# Patient Record
Sex: Female | Born: 1945 | Race: White | Hispanic: Yes | Smoking: Former smoker
Health system: Southern US, Community
[De-identification: ages and names within clinical notes are randomized; demographics above are authoritative.]

## PROBLEM LIST (undated history)

## (undated) DIAGNOSIS — C3412 Malignant neoplasm of upper lobe, left bronchus or lung: Secondary | ICD-10-CM

## (undated) DIAGNOSIS — N179 Acute kidney failure, unspecified: Secondary | ICD-10-CM

## (undated) DIAGNOSIS — G9341 Metabolic encephalopathy: Secondary | ICD-10-CM

## (undated) DIAGNOSIS — E46 Unspecified protein-calorie malnutrition: Secondary | ICD-10-CM

## (undated) DIAGNOSIS — B958 Unspecified staphylococcus as the cause of diseases classified elsewhere: Secondary | ICD-10-CM

## (undated) DIAGNOSIS — M71051 Abscess of bursa, right hip: Secondary | ICD-10-CM

## (undated) DIAGNOSIS — G44229 Chronic tension-type headache, not intractable: Secondary | ICD-10-CM

## (undated) DIAGNOSIS — I4891 Unspecified atrial fibrillation: Secondary | ICD-10-CM

## (undated) DIAGNOSIS — F10939 Alcohol use, unspecified with withdrawal, unspecified: Secondary | ICD-10-CM

## (undated) DIAGNOSIS — J189 Pneumonia, unspecified organism: Secondary | ICD-10-CM

## (undated) DIAGNOSIS — D509 Iron deficiency anemia, unspecified: Secondary | ICD-10-CM

## (undated) DIAGNOSIS — R1312 Dysphagia, oropharyngeal phase: Secondary | ICD-10-CM

## (undated) DIAGNOSIS — F329 Major depressive disorder, single episode, unspecified: Secondary | ICD-10-CM

## (undated) DIAGNOSIS — F10239 Alcohol dependence with withdrawal, unspecified: Secondary | ICD-10-CM

## (undated) DIAGNOSIS — J9690 Respiratory failure, unspecified, unspecified whether with hypoxia or hypercapnia: Secondary | ICD-10-CM

## (undated) DIAGNOSIS — I447 Left bundle-branch block, unspecified: Secondary | ICD-10-CM

## (undated) DIAGNOSIS — M6281 Muscle weakness (generalized): Secondary | ICD-10-CM

## (undated) DIAGNOSIS — K651 Peritoneal abscess: Secondary | ICD-10-CM

## (undated) DIAGNOSIS — L03119 Cellulitis of unspecified part of limb: Secondary | ICD-10-CM

## (undated) DIAGNOSIS — F32A Depression, unspecified: Secondary | ICD-10-CM

## (undated) DIAGNOSIS — I10 Essential (primary) hypertension: Secondary | ICD-10-CM

## (undated) DIAGNOSIS — M069 Rheumatoid arthritis, unspecified: Secondary | ICD-10-CM

## (undated) DIAGNOSIS — E870 Hyperosmolality and hypernatremia: Secondary | ICD-10-CM

## (undated) DIAGNOSIS — J45909 Unspecified asthma, uncomplicated: Secondary | ICD-10-CM

## (undated) DIAGNOSIS — R571 Hypovolemic shock: Secondary | ICD-10-CM

## (undated) HISTORY — DX: Metabolic encephalopathy: G93.41

## (undated) HISTORY — PX: TONSILLECTOMY: SUR1361

## (undated) HISTORY — DX: Dysphagia, oropharyngeal phase: R13.12

## (undated) HISTORY — DX: Acute kidney failure, unspecified: N17.9

## (undated) HISTORY — DX: Hypovolemic shock: R57.1

## (undated) HISTORY — DX: Chronic tension-type headache, not intractable: G44.229

## (undated) HISTORY — DX: Malignant neoplasm of upper lobe, left bronchus or lung: C34.12

## (undated) HISTORY — DX: Peritoneal abscess: K65.1

## (undated) HISTORY — DX: Alcohol use, unspecified with withdrawal, unspecified: F10.939

## (undated) HISTORY — DX: Left bundle-branch block, unspecified: I44.7

## (undated) HISTORY — DX: Cellulitis of unspecified part of limb: L03.119

## (undated) HISTORY — DX: Unspecified protein-calorie malnutrition: E46

## (undated) HISTORY — PX: BREAST ENHANCEMENT SURGERY: SHX7

## (undated) HISTORY — PX: LUMBAR FUSION: SHX111

## (undated) HISTORY — DX: Respiratory failure, unspecified, unspecified whether with hypoxia or hypercapnia: J96.90

## (undated) HISTORY — DX: Hyperosmolality and hypernatremia: E87.0

## (undated) HISTORY — PX: EYE SURGERY: SHX253

## (undated) HISTORY — DX: Pneumonia, unspecified organism: J18.9

## (undated) HISTORY — DX: Essential (primary) hypertension: I10

## (undated) HISTORY — DX: Unspecified asthma, uncomplicated: J45.909

## (undated) HISTORY — DX: Rheumatoid arthritis, unspecified: M06.9

## (undated) HISTORY — DX: Iron deficiency anemia, unspecified: D50.9

## (undated) HISTORY — PX: COLONOSCOPY: SHX174

## (undated) HISTORY — DX: Unspecified staphylococcus as the cause of diseases classified elsewhere: B95.8

## (undated) HISTORY — DX: Abscess of bursa, right hip: M71.051

## (undated) HISTORY — DX: Depression, unspecified: F32.A

## (undated) HISTORY — DX: Alcohol dependence with withdrawal, unspecified: F10.239

## (undated) HISTORY — PX: ESOPHAGOGASTRODUODENOSCOPY: SHX1529

## (undated) HISTORY — PX: CERVICAL FUSION: SHX112

## (undated) HISTORY — PX: ABDOMINAL HYSTERECTOMY: SUR658

## (undated) HISTORY — DX: Muscle weakness (generalized): M62.81

---
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
## 2019-03-28 DIAGNOSIS — R7881 Bacteremia: Secondary | ICD-10-CM

## 2019-03-28 DIAGNOSIS — K922 Gastrointestinal hemorrhage, unspecified: Secondary | ICD-10-CM

## 2019-03-28 DIAGNOSIS — B9561 Methicillin susceptible Staphylococcus aureus infection as the cause of diseases classified elsewhere: Secondary | ICD-10-CM

## 2019-03-28 HISTORY — DX: Methicillin susceptible Staphylococcus aureus infection as the cause of diseases classified elsewhere: B95.61

## 2019-03-28 HISTORY — DX: Bacteremia: R78.81

## 2019-03-28 HISTORY — DX: Gastrointestinal hemorrhage, unspecified: K92.2

## 2019-03-28 HISTORY — PX: ABCESS DRAINAGE: SHX399

## 2019-03-28 HISTORY — PX: INCISION AND DRAINAGE OF WOUND: SHX1803

## 2019-05-27 ENCOUNTER — Other Ambulatory Visit: Payer: Self-pay

## 2019-05-27 ENCOUNTER — Ambulatory Visit (INDEPENDENT_AMBULATORY_CARE_PROVIDER_SITE_OTHER): Payer: Medicare PPO | Admitting: Internal Medicine

## 2019-05-27 DIAGNOSIS — Z8719 Personal history of other diseases of the digestive system: Secondary | ICD-10-CM | POA: Diagnosis not present

## 2019-05-27 DIAGNOSIS — R7881 Bacteremia: Secondary | ICD-10-CM | POA: Diagnosis not present

## 2019-05-27 DIAGNOSIS — M069 Rheumatoid arthritis, unspecified: Secondary | ICD-10-CM | POA: Insufficient documentation

## 2019-05-27 DIAGNOSIS — L03119 Cellulitis of unspecified part of limb: Secondary | ICD-10-CM

## 2019-05-27 DIAGNOSIS — R911 Solitary pulmonary nodule: Secondary | ICD-10-CM | POA: Diagnosis not present

## 2019-05-27 DIAGNOSIS — L02419 Cutaneous abscess of limb, unspecified: Secondary | ICD-10-CM

## 2019-05-27 DIAGNOSIS — B9561 Methicillin susceptible Staphylococcus aureus infection as the cause of diseases classified elsewhere: Secondary | ICD-10-CM

## 2019-05-27 DIAGNOSIS — D649 Anemia, unspecified: Secondary | ICD-10-CM

## 2019-05-27 DIAGNOSIS — J984 Other disorders of lung: Secondary | ICD-10-CM | POA: Insufficient documentation

## 2019-05-27 NOTE — Progress Notes (Signed)
[facility]  Reason for Consult: MSSA bacteremia Referring Provider: Dr. Virgel Bouquet  Assessment: Samantha Keller recently developed a severe, metastatic MSSA infection.  Dr. Darrel Reach said that it was unclear if this began from soft tissue infection in her legs or pneumonia.  Samantha Keller seems to be improving but needs to continue IV cefazolin through 06/25/2019.  She will need a follow-up chest CT scan with contrast.  I have reviewed this recommendation with her daughters and they are in agreement.  I am told that she has a pulmonary evaluation scheduled in the near future with Dr. Noemi Chapel.  Plan: 1. Continue IV cefazolin through 06/25/2019 2. Arrange follow-up chest CT scan with contrast around 06/14/2019  Patient Active Problem List   Diagnosis Date Noted  . Bacteremia due to methicillin susceptible Staphylococcus aureus (MSSA) 05/27/2019    Patient's Medications   No medications on file    HPI: Samantha Keller is a 74 y.o. female from [place], [place] with a history of MSSA bacteremia.  She was referred from [facility] but I do not have any records from the facility pertaining to any specifics about when or where she was diagnosed and what work-up she had.  Fortunately, she is accompanied by her 2 daughters today who are able to give great detail.  She has a history of rheumatoid arthritis.  She developed some increasing left leg pain in December and her doctor increased her dose of Humira suspecting a flare of her RA.  Samantha Keller received her first Covid vaccine in mid January.  Shortly after that her daughters became concerned that she was confused when they spoke to her over the phone.  Thet finally called police on 7/56/4332.  She was found to be confused, febrile and hypoxic and was admitted to the hospital in [place].  She had hyponatremia and acute kidney injury.  Chest x-ray and CT scan revealed a right upper lobe mass concerning for  cancer versus infection.  They tell me that blood cultures grew MSSA.  She was on a variety of antibiotics during the first week of her illness.  An infectious disease doctor was consulted.  Samantha Keller condition grew worse when she developed a GI bleed and required intubation.  She was treated for a right IJ central line infection.  Scans of her brain and spine were negative for infection.  Scans of her lower extremities revealed left thigh cellulitis and bilateral soft tissue abscesses around her hip.  She underwent incision and drainage of the abscesses.  The abscess on her right side tracked down to her greater trochanter.  There was concern for drug fever to ampicillin.  She was switched to IV cefazolin and had a PICC placed in her left arm.  She has now completed 35 days of total IV antibiotic therapy.    She tested negative for C. difficile colitis.  Echocardiography, including TEE, did not show any evidence of endocarditis.  I spoke to Dr. Royetta Crochet [phone]), an internist who cared for her.. She told me that Samantha Keller underwent a repeat chest CT scan which showed enlargement and cavitation of the right upper lobe infiltrate.  Dr. Darrel Reach said that the final recommendation was to continue IV cefazolin for 6 weeks after discharge on 05/14/2019 given the concerns about cavitary pneumonia and the fact that her right trochanteric bursa abscess had just been drained.  Dr. Darrel Reach said that upon discharge her hemoglobin  was 8.8 and her creatinine had normalized to 0.8.  Samantha Keller was transferred to [facility] skilled nursing facility on 05/14/2019.  I do not have any lab work from [facility]  She does not have any recall of her hospitalization.  She says that she is unsure if she is getting better.  She is participating and physical therapy and is able to stand and do some walking.  Her appetite remains poor and she is very weak.  Her short-term memory has suffered.  Review of  Systems: Review of Systems  Unable to perform ROS: Other  Constitutional:       All history is provided by her daughters.      No past medical history on file.  Social History   Tobacco Use  . Smoking status: Not on file  Substance Use Topics  . Alcohol use: Not on file  . Drug use: Not on file    No family history on file. Not on File  OBJECTIVE: Vitals:   05/27/19 0851  BP: (!) 151/86  Pulse: (!) 108   There is no height or weight on file to calculate BMI.   Physical Exam Constitutional:      Comments: She is weak and pale but otherwise in no distress.  Cardiovascular:     Rate and Rhythm: Normal rate and regular rhythm.     Heart sounds: No murmur.  Pulmonary:     Effort: Pulmonary effort is normal.     Breath sounds: Normal breath sounds.  Abdominal:     Palpations: Abdomen is soft.     Tenderness: There is no abdominal tenderness.  Musculoskeletal:     Right lower leg: Edema present.     Left lower leg: Edema present.  Skin:    Findings: No rash.     Comments: Left upper arm PICC.  Some nodularity in her right neck where her IJ line had been.  Neurological:     General: No focal deficit present.  Psychiatric:        Mood and Affect: Mood normal.    CMP     Component Value Date/Time   NA 136 05/27/2019 1010   K 3.6 05/27/2019 1010   CL 100 05/27/2019 1010   CO2 30 05/27/2019 1010   GLUCOSE 95 05/27/2019 1010   BUN 12 05/27/2019 1010   CREATININE 0.62 05/27/2019 1010   CALCIUM 8.2 (L) 05/27/2019 1010   Lab Results  Component Value Date   WBC 11.0 (H) 05/27/2019   HGB 8.8 (L) 05/27/2019   HCT 27.3 (L) 05/27/2019   MCV 95.5 05/27/2019   PLT 276 05/27/2019   Sed Rate (mm/h)  Date Value  05/27/2019 62 (H)   CRP (mg/L)  Date Value  05/27/2019 59.2 (H)    Microbiology: No results found for this or any previous visit (from the past 240 hour(s)).  Michel Bickers, MD [facility] [phone] pager   [phone] cell 05/27/2019, 9:34 AM

## 2019-05-28 ENCOUNTER — Other Ambulatory Visit: Payer: Self-pay | Admitting: Internal Medicine

## 2019-05-28 ENCOUNTER — Telehealth: Payer: Self-pay | Admitting: *Deleted

## 2019-05-28 ENCOUNTER — Encounter: Payer: Self-pay | Admitting: Internal Medicine

## 2019-05-28 DIAGNOSIS — J984 Other disorders of lung: Secondary | ICD-10-CM

## 2019-05-28 DIAGNOSIS — R911 Solitary pulmonary nodule: Secondary | ICD-10-CM

## 2019-05-28 LAB — BASIC METABOLIC PANEL
BUN: 12 mg/dL (ref 7–25)
CO2: 30 mmol/L (ref 20–32)
Calcium: 8.2 mg/dL — ABNORMAL LOW (ref 8.6–10.4)
Chloride: 100 mmol/L (ref 98–110)
Creat: 0.62 mg/dL (ref 0.60–0.93)
Glucose, Bld: 95 mg/dL (ref 65–99)
Potassium: 3.6 mmol/L (ref 3.5–5.3)
Sodium: 136 mmol/L (ref 135–146)

## 2019-05-28 LAB — SEDIMENTATION RATE: Sed Rate: 62 mm/h — ABNORMAL HIGH (ref 0–30)

## 2019-05-28 LAB — C-REACTIVE PROTEIN: CRP: 59.2 mg/L — ABNORMAL HIGH (ref <8.0)

## 2019-05-28 LAB — CBC
HCT: 27.3 % — ABNORMAL LOW (ref 35.0–45.0)
Hemoglobin: 8.8 g/dL — ABNORMAL LOW (ref 11.7–15.5)
MCH: 30.8 pg (ref 27.0–33.0)
MCHC: 32.2 g/dL (ref 32.0–36.0)
MCV: 95.5 fL (ref 80.0–100.0)
MPV: 9.7 fL (ref 7.5–12.5)
Platelets: 276 10*3/uL (ref 140–400)
RBC: 2.86 10*6/uL — ABNORMAL LOW (ref 3.80–5.10)
RDW: 15.5 % — ABNORMAL HIGH (ref 11.0–15.0)
WBC: 11 10*3/uL — ABNORMAL HIGH (ref 3.8–10.8)

## 2019-05-28 NOTE — Telephone Encounter (Signed)
RN faxed office note, written lab orders (weekly CBC, BMP, ESR, CRP) and order for Chest CT to Norvel Richards, nurse taking care of Hadiyah today at [facility]. Joycelyn Schmid is working on prior authorization for Chest CT, per Dr Megan Salon should be scheduled around 3/20. Per Norvel Richards, ok for this to be scheduled at the hospital.  Joycelyn Schmid will need to coordinate transportation with Shauna Hugh or Pamala Hurry at [facility].  If Joycelyn Schmid needs any additional insurance information, she can reach out to [employer] or [place].  All are available at the main number [phone]. Landis Gandy, RN

## 2019-05-29 ENCOUNTER — Telehealth: Payer: Self-pay | Admitting: Gastroenterology

## 2019-05-29 NOTE — Telephone Encounter (Signed)
DOD 05/29/19   Dr. Bryan Lemma, there is a referral from [facility] to evaluate pt for GI bleed.  Records will be sent to you for review.  Please advise scheduling.

## 2019-05-30 NOTE — Telephone Encounter (Signed)
Spoke with pt's nurse--she stated that pt came to [facility] from [facility], so notes are limited but she will fax over what she has.    A message was left with facility's transportation line to call back to schedule OV for pt.

## 2019-05-30 NOTE — Telephone Encounter (Signed)
Referral paperwork reviewed and notable following:   -Referral notes are quite limited, essentially just list of ICD 10 codes and MAR with daily vitals over the last week or so.  No written notes, plans, etc. No labs for review -Recently completed course of cefazolin 2/19-3/3. -Started taking ferrous sulfate 325 mg daily) on 5/74/7340 -Started folic acid 2 mg/daily on 2/17 -Additional medications include MiraLAX, Protonix 40 mg bid along baclofen, Cymbalta, estradiol patch, gabapentin, melatonin, Restasis drops -Afebrile heart rate range 81-107, normal RR, mildly elevated SBP  While I am happy to see this patient for evaluation, would certainly need more records to be sent from [facility] and rehab to assist better, to include recent notes/documentation, labs, etc.  Please assist in obtaining these records for my review before her appointment.  Otherwise, okay to schedule for OV

## 2019-06-02 ENCOUNTER — Encounter: Payer: Self-pay | Admitting: Internal Medicine

## 2019-06-05 ENCOUNTER — Encounter: Payer: Self-pay | Admitting: Critical Care Medicine

## 2019-06-05 ENCOUNTER — Other Ambulatory Visit: Payer: Self-pay

## 2019-06-05 ENCOUNTER — Ambulatory Visit (INDEPENDENT_AMBULATORY_CARE_PROVIDER_SITE_OTHER): Payer: Medicare PPO | Admitting: Critical Care Medicine

## 2019-06-05 VITALS — BP 110/85 | HR 99 | Temp 97.6°F

## 2019-06-05 DIAGNOSIS — J984 Other disorders of lung: Secondary | ICD-10-CM

## 2019-06-05 DIAGNOSIS — J9611 Chronic respiratory failure with hypoxia: Secondary | ICD-10-CM | POA: Diagnosis not present

## 2019-06-05 DIAGNOSIS — R5381 Other malaise: Secondary | ICD-10-CM

## 2019-06-05 DIAGNOSIS — R7881 Bacteremia: Secondary | ICD-10-CM

## 2019-06-05 DIAGNOSIS — Z87891 Personal history of nicotine dependence: Secondary | ICD-10-CM

## 2019-06-05 DIAGNOSIS — B9561 Methicillin susceptible Staphylococcus aureus infection as the cause of diseases classified elsewhere: Secondary | ICD-10-CM

## 2019-06-05 DIAGNOSIS — R601 Generalized edema: Secondary | ICD-10-CM

## 2019-06-05 NOTE — Progress Notes (Signed)
Synopsis: Referred in March 2021 for a lung lesion by Michel Bickers, MD.  Subjective:   PATIENT ID: Samantha Keller GENDER: female DOB: 1945-05-07, MRN: [id number]  Chief Complaint  Patient presents with  . Consult    Patient was admitted to hospital(Jan-Feb) in [facility] for double pneumonia and also found a spot on her lung. Patient is on 2 liters. Patient has picc line on left side.    Mrs. Birkeland is a 74 year old woman with history of rheumatoid arthritis previously on anti-TNF therapy who presents for follow-up of a right upper lobe cavitary lung lesion.  2 daughters present during the visit who provided most of the history.  This lung mass was identified during hospitalization for MSSA bacteremia resulting from a skin and soft tissue infection.  Throughout the hospitalization this lesion changed and began to cavitate per reports of her daughters.  She has no previous history of malignancy other than skin cancers.  She had an aunt with breast cancer, but no other family history of malignancy.  She smoked 2 packs/day for 20 years before quitting in 1984. She has a history of childhood asthma but no symptoms throughout adulthood.  During her recent hospitalization in [place], [place] she had bilateral pneumonia, AKI, ETOH w/d, upper GI bleed with hemorrhagic shock, CLABSI, and required intubation mechanical ventilation for about 6 days.  She underwent I&D of 5 hip abscesses where the infection began.  She is currently on IV cefazolin therapy via left upper extremity PICC.  Her RA medications have all been held since her illness.  She has established care with Dr. Megan Salon from infectious disease (05/27/2019 clinic note reviewed).  She will remain on cefazolin through 06/25/2019.  She has a follow-up chest CT ordered the last week of March.  She was discharged in the hospital on 05/14/2019 to [facility] for rehab.  She has been spending most of the day in bed when she is not participating in therapy.  She  continues to have significant weakness.  She was discharged from the hospital without oxygen, but was started on 2 L of supplemental oxygen during her at rehab.  She had shortness of breath prior to being started on oxygen, but feels better on oxygen.  She has a chronic cough with minimal sputum.  She was evaluated by an ENT yesterday who told her doctors that she had pooled phlegm and she should continue coughing.  She denies wheezing.  She had no shortness of breath or pulmonary symptoms prior to hospitalization.  Left upper extremity swelling-daughters report that and ultrasound was done earlier this week which confirmed no DVT around her PICC.  Records from outside hospital reviewed-brought to the visit by her daughter. Complicated admission. Multiple procedures performed- intubated, b/l IJ & right femoral CVCs, Aline, thora (transudative fluid), I&D, EGD with injection of arterial bleeding, PICC, TE (normal valves, no vegetations, but had clot on end of CVC)  TTE (OSH, 04/23/19): LVEG 50%, trace MR, miltly thickened aortic valve leaflets, trace TR. TEE (OSH, 04/30/19): LVEF 55-60%, vegetation on tip of CVC in SVC, but normal valves without vegetation.  Pleural fluid glucose 112, LDH 89, protein <3g. No records of cytology being performed.  CT chest 05/06/19: 2.2cm RUL cavitating nodule, moderate b/l pleural effusions, anasarca CT chest 04/27/19: 3.1x 1.8cm masslike consolidation RUL, b/l pleural effusions      Past Medical History:  Diagnosis Date  . MSSA bacteremia 2021  . RA (rheumatoid arthritis) ([place])   . UGIB (upper gastrointestinal bleed)  2021     Family History  Problem Relation Age of Onset  . Asthma Mother   . Asthma Father   . COPD Father   . Asthma Sister      Past Surgical History:  Procedure Laterality Date  . ABCESS DRAINAGE Bilateral 2021  . INCISION AND DRAINAGE OF WOUND Bilateral 2021   hip, thigh    Social History   Socioeconomic History  . Marital status:  Divorced    Spouse name: Not on file  . Number of children: Not on file  . Years of education: Not on file  . Highest education level: Not on file  Occupational History  . Not on file  Tobacco Use  . Smoking status: Former Smoker    Packs/day: 2.00    Years: 20.00    Pack years: 40.00    Types: Cigarettes    Quit date: 06/05/1982    Years since quitting: 37.0  . Smokeless tobacco: Never Used  Substance and Sexual Activity  . Alcohol use: Not Currently  . Drug use: Never  . Sexual activity: Not Currently  Other Topics Concern  . Not on file  Social History Narrative  . Not on file   Social Determinants of Health   Financial Resource Strain:   . Difficulty of Paying Living Expenses:   Food Insecurity:   . Worried About Charity fundraiser in the Last Year:   . Arboriculturist in the Last Year:   Transportation Needs:   . Film/video editor (Medical):   Marland Kitchen Lack of Transportation (Non-Medical):   Physical Activity:   . Days of Exercise per Week:   . Minutes of Exercise per Session:   Stress:   . Feeling of Stress :   Social Connections:   . Frequency of Communication with Friends and Family:   . Frequency of Social Gatherings with Friends and Family:   . Attends Religious Services:   . Active Member of Clubs or Organizations:   . Attends Archivist Meetings:   Marland Kitchen Marital Status:   Intimate Partner Violence:   . Fear of Current or Ex-Partner:   . Emotionally Abused:   Marland Kitchen Physically Abused:   . Sexually Abused:      Allergies  Allergen Reactions  . Iodinated Diagnostic Agents Anaphylaxis  . Shellfish Allergy Anaphylaxis  . Shellfish-Derived Products Anaphylaxis      There is no immunization history on file for this patient.  Outpatient Medications Prior to Visit  Medication Sig Dispense Refill  . Ascorbic Acid (VITAMIN C) 500 MG CAPS Take 500 mg by mouth daily.    . baclofen (LIORESAL) 10 MG tablet Take 10 mg by mouth 3 (three) times daily.    Marland Kitchen  ceFAZolin (ANCEF) 1 g injection Inject 2 g into the vein every 8 (eight) hours.    . cycloSPORINE (RESTASIS) 0.05 % ophthalmic emulsion 1 drop 2 (two) times daily.    Marland Kitchen doxycycline (VIBRA-TABS) 100 MG tablet Take 100 mg by mouth 2 (two) times daily.    . DULoxetine (CYMBALTA) 20 MG capsule Take 20 mg by mouth daily.    Marland Kitchen estradiol (CLIMARA - DOSED IN MG/24 HR) 0.0375 mg/24hr patch Place 0.0375 mg onto the skin once a week.    . ferrous sulfate 325 (65 FE) MG tablet Take 325 mg by mouth daily with breakfast.    . folic acid (FOLVITE) 0.5 MG tablet Take 2 tablets by mouth daily.    Marland Kitchen gabapentin (NEURONTIN)  600 MG tablet Take 600 mg by mouth 3 (three) times daily.    . Melatonin 5 MG TABS Take by mouth.    . pantoprazole (PROTONIX) 40 MG tablet Take 40 mg by mouth daily.    . polyethylene glycol (MIRALAX / GLYCOLAX) 17 g packet Take 17 g by mouth daily.    . potassium chloride (KLOR-CON) 20 MEQ packet Take 20 mEq by mouth 2 (two) times daily.    Marland Kitchen tuberculin (TUBERSOL) 5 UNIT/0.1ML injection Inject into the skin once.    Marland Kitchen ceFAZolin (ANCEF) IVPB Inject into the vein.    . folic acid (FOLVITE) 1 MG tablet Take 1 mg by mouth daily.     No facility-administered medications prior to visit.    Review of Systems  Constitutional: Positive for malaise/fatigue. Negative for chills and fever.  Respiratory: Positive for cough and shortness of breath. Negative for wheezing.   Cardiovascular: Positive for leg swelling.  Musculoskeletal: Negative for joint pain.  Neurological: Positive for weakness.     Objective:   Vitals:   06/05/19 1014 06/05/19 1015  BP:  110/85  Pulse: 99   Temp:  97.6 F (36.4 C)  TempSrc:  Temporal  SpO2: 99%    99% on 2 LPM   BMI Readings from Last 3 Encounters:  No data found for BMI   Wt Readings from Last 3 Encounters:  No data found for Wt    Physical Exam Vitals reviewed.  Constitutional:      General: She is not in acute distress.    Comments: Frail  and chronically ill-appearing  HENT:     Head: Normocephalic and atraumatic.  Eyes:     General: No scleral icterus. Cardiovascular:     Rate and Rhythm: Normal rate and regular rhythm.     Heart sounds: No murmur.  Pulmonary:     Comments: Breathing comfortably on 2 L nasal cannula, reduced bibasilar breath sounds, otherwise clear to auscultation.  No tachypnea. Abdominal:     General: There is no distension.     Palpations: Abdomen is soft.  Musculoskeletal:     Cervical back: Neck supple.     Comments: Significant left upper extremity swelling into her hand.  Anasarca of upper extremities to about 3 inches above her elbow, anasarca of bilateral lower extremities to her waist.  Lymphadenopathy:     Cervical: No cervical adenopathy.  Skin:    General: Skin is warm and dry.     Coloration: Skin is pale.     Findings: No rash.  Neurological:     Mental Status: She is alert.     Coordination: Coordination normal.     Comments: Sitting in a wheelchair, moving upper extremities, but globally weak.  Psychiatric:        Mood and Affect: Mood normal.        Behavior: Behavior normal.      CBC    Component Value Date/Time   WBC 11.0 (H) 05/27/2019 1010   RBC 2.86 (L) 05/27/2019 1010   HGB 8.8 (L) 05/27/2019 1010   HCT 27.3 (L) 05/27/2019 1010   PLT 276 05/27/2019 1010   MCV 95.5 05/27/2019 1010   MCH 30.8 05/27/2019 1010   MCHC 32.2 05/27/2019 1010   RDW 15.5 (H) 05/27/2019 1010    CHEMISTRY No results for input(s): NA, K, CL, CO2, GLUCOSE, BUN, CREATININE, CALCIUM, MG, PHOS in the last 168 hours. CrCl cannot be calculated (Unknown ideal weight.).   Chest Imaging- films reviewed:  Imaging reports only available- reviewed as above.  Pulmonary Functions Testing Results: No flowsheet data found.   TTE (OSH, 04/23/19): LVEG 50%, trace MR, miltly thickened aortic valve leaflets, trace TR. TTEE 04/30/19: LVEF 55-60%, vegetation on tip of CVC in SVC, but normal valves without  vegetation.      Assessment & Plan:     ICD-10-CM   1. Chronic respiratory failure with hypoxia (HCC)  J96.11   2. Anasarca  R60.1   3. MSSA bacteremia  R78.81    B95.61   4. Cavitary lesion of lung  J98.4   5. History of tobacco abuse  Z87.891   6. Debility  R53.81     Cavitary lung lesion- by reports may have shrunken during her admission in the process of cavitating. This raises suspicion for infectious etiology related to MSSA bacteremia or possibly aspiration pneumonia, although her history of tobacco abuse and immunosuppression are concerning. Her history of skin cancer has not been clarified as melanoma vs non-melanoma, which affects her potential risk for metastatic lesion. -follow up chest CT scan in late March -if mass persists without a significant decrease in size or enlarges needs biopsy for path & micro -requested daughters obtain disks of previous chest CT scans for comparison  Chronic respiratory failure with hypoxia-based on physical exam expect is due to third spacing of fluid and atelectasis. -Encourage mobility, some things as simple as leaning forward and taking deep breaths, use of incentive spirometer. -She currently does not appear stable enough to undergo a significant invasive procedure or biopsy. -Agree with ongoing rehabilitation efforts     I spent >80 minutes on this encounter, including face to face time and non-face to face time spent reviewing records, charting, coordinating care, etc. Plan discussed with the patient and her 2 daughters.   Current Outpatient Medications:  .  Ascorbic Acid (VITAMIN C) 500 MG CAPS, Take 500 mg by mouth daily., Disp: , Rfl:  .  baclofen (LIORESAL) 10 MG tablet, Take 10 mg by mouth 3 (three) times daily., Disp: , Rfl:  .  ceFAZolin (ANCEF) 1 g injection, Inject 2 g into the vein every 8 (eight) hours., Disp: , Rfl:  .  cycloSPORINE (RESTASIS) 0.05 % ophthalmic emulsion, 1 drop 2 (two) times daily., Disp: , Rfl:  .   doxycycline (VIBRA-TABS) 100 MG tablet, Take 100 mg by mouth 2 (two) times daily., Disp: , Rfl:  .  DULoxetine (CYMBALTA) 20 MG capsule, Take 20 mg by mouth daily., Disp: , Rfl:  .  estradiol (CLIMARA - DOSED IN MG/24 HR) 0.0375 mg/24hr patch, Place 0.0375 mg onto the skin once a week., Disp: , Rfl:  .  ferrous sulfate 325 (65 FE) MG tablet, Take 325 mg by mouth daily with breakfast., Disp: , Rfl:  .  folic acid (FOLVITE) 0.5 MG tablet, Take 2 tablets by mouth daily., Disp: , Rfl:  .  gabapentin (NEURONTIN) 600 MG tablet, Take 600 mg by mouth 3 (three) times daily., Disp: , Rfl:  .  Melatonin 5 MG TABS, Take by mouth., Disp: , Rfl:  .  pantoprazole (PROTONIX) 40 MG tablet, Take 40 mg by mouth daily., Disp: , Rfl:  .  polyethylene glycol (MIRALAX / GLYCOLAX) 17 g packet, Take 17 g by mouth daily., Disp: , Rfl:  .  potassium chloride (KLOR-CON) 20 MEQ packet, Take 20 mEq by mouth 2 (two) times daily., Disp: , Rfl:  .  tuberculin (TUBERSOL) 5 UNIT/0.1ML injection, Inject into the skin once., Disp: , Rfl:  Julian Hy, DO [facility] 06/05/2019 12:12 PM

## 2019-06-05 NOTE — Patient Instructions (Addendum)
Thank you for visiting Dr. Carlis Abbott at [facility]. We recommend the following:  CT scan scheduled for 3/25 at 2pm at [facility]  Please obtain disc of CT scans of the chest from prior hospitalization to bring to Radiology Department at [facility] for [place] to compare images.   Return in about 3 weeks (around 06/26/2019).    Please do your part to reduce the spread of COVID-19.

## 2019-06-09 ENCOUNTER — Ambulatory Visit (HOSPITAL_COMMUNITY): Payer: Medicare PPO

## 2019-06-11 ENCOUNTER — Other Ambulatory Visit: Payer: Self-pay

## 2019-06-11 ENCOUNTER — Ambulatory Visit: Payer: Medicare PPO | Admitting: Gastroenterology

## 2019-06-11 ENCOUNTER — Encounter: Payer: Self-pay | Admitting: Gastroenterology

## 2019-06-11 ENCOUNTER — Ambulatory Visit (INDEPENDENT_AMBULATORY_CARE_PROVIDER_SITE_OTHER): Payer: Medicare PPO | Admitting: Gastroenterology

## 2019-06-11 VITALS — BP 130/84 | HR 106 | Temp 97.1°F | Ht 62.0 in | Wt 165.2 lb

## 2019-06-11 DIAGNOSIS — K922 Gastrointestinal hemorrhage, unspecified: Secondary | ICD-10-CM

## 2019-06-11 DIAGNOSIS — K279 Peptic ulcer, site unspecified, unspecified as acute or chronic, without hemorrhage or perforation: Secondary | ICD-10-CM | POA: Diagnosis not present

## 2019-06-11 DIAGNOSIS — Z01818 Encounter for other preprocedural examination: Secondary | ICD-10-CM

## 2019-06-11 DIAGNOSIS — K5900 Constipation, unspecified: Secondary | ICD-10-CM | POA: Diagnosis not present

## 2019-06-11 DIAGNOSIS — K2970 Gastritis, unspecified, without bleeding: Secondary | ICD-10-CM

## 2019-06-11 NOTE — Patient Instructions (Signed)
Due to recent COVID-19 restrictions implemented by our local and state authorities and in an effort to keep both patients and staff as safe as possible, our hospital system now requires COVID-19 testing prior to any scheduled hospital procedure. Please go to our [facility] location drive thru testing site ([address], [place],  [address]) on 07/02/19 at  930am. There will be multiple testing areas, the first checkpoint being for pre-procedure/surgery testing. Get into the right (yellow) lane that leads to the PAT testing team. You will not be billed at the time of testing but may receive a bill later depending on your insurance. The approximate cost of the test is $100. You must agree to quarantine from the time of your testing until the procedure date on 07/04/19 . This should include staying at home with ONLY the people you live with. Avoid take-out, grocery store shopping or leaving the house for any non-emergent reason. Failure to have your COVID-19 test done on the date and time you have been scheduled will result in cancellation of procedure. Please call our office at [phone] if you have any questions.   You have been scheduled to have an anorectal manometry at [facility] on 07/04/19 at 1030am. Please arrive 30 minutes prior to your appointment time for registration (1st floor of the hospital-admissions).  Please make certain to use 1 Fleets enema 2 hours prior to coming for your appointment. You can purchase Fleets enemas from the laxative section at your drug store. You should not eat anything during the two hours prior to the procedure. You may take regular medications with small sips of water at least 2 hours prior to the study.  Anorectal manometry is a test performed to evaluate patients with constipation or fecal incontinence. This test measures the pressures of the anal sphincter muscles, the sensation in the rectum, and the neural reflexes that are needed for normal  bowel movements.  THE PROCEDURE The test takes approximately 30 minutes to 1 hour. You will be asked to change into a hospital gown. A technician or nurse will explain the procedure to you, take a brief health history, and answer any questions you may have. The patient then lies on his or her left side. A small, flexible tube, about the size of a thermometer, with a balloon at the end is inserted into the rectum. The catheter is connected to a machine that measures the pressure. During the test, the small balloon attached to the catheter may be inflated in the rectum to assess the normal reflex pathways. The nurse or technician may also ask the person to squeeze, relax, and push at various times. The anal sphincter muscle pressures are measured during each of these maneuvers. To squeeze, the patient tightens the sphincter muscles as if trying to prevent anything from coming out. To push or bear down, the patient strains down as if trying to have a bowel movement.  You have been scheduled for an endoscopy at [facility]. Please follow written instructions given to you at your visit today. If you use inhalers (even only as needed), please bring them with you on the day of your procedure. Your physician has requested that you go to www.startemmi.com and enter the access code given to you at your visit today. This web site gives a general overview about your procedure. However, you should still follow specific instructions given to you by our office regarding your preparation for the procedure.  It was a pleasure  to see you today!  Vito Cirigliano, D.O.

## 2019-06-11 NOTE — Progress Notes (Signed)
Chief Complaint: Recent upper GI bleed  Referring Provider:     Virgel Bouquet   HPI:    Samantha Keller is a 74 y.o. female referred to the Gastroenterology Clinic for evaluation after recent prolonged hospital course complicated by upper GI bleed as outlined below..  Recent hospitalization at [facility] in [place], [place], and was seen by Dr. Carlis Abbott in the Pulmonary clinic on 06/05/2019 with the following noted about recent hospital course: Hospitalized with bilateral pneumonia, AKI, ETOH w/d, upper GI bleed with hemorrhagic shock, CLABSI, and required intubation mechanical ventilation for about 6 days.  She underwent I&D of 5 hip abscesses where the infection began.  She is currently on IV cefazolin therapy via left upper extremity PICC.  Her RA medications have all been held since her illness.  She has established care with Dr. Megan Salon from infectious disease.  She will remain on cefazolin through 06/25/2019.  She has a follow-up chest CT ordered the last week of March.  She was discharged in the hospital on 05/14/2019 to [facility] for rehab.  She has been spending most of the day in bed when she is not participating in therapy.  She continues to have significant weakness.  She was discharged from the hospital without oxygen, but was started on 2 L of supplemental oxygen during her at rehab.    Complicated admission. Multiple procedures performed- intubated, b/l IJ & right femoral CVCs, Aline, thora (transudative fluid), I&D, EGD with treatment of arterial bleeding, PICC, TE (normal valves, no vegetations, but had clot on end of CVC)  TTE ([facility], 04/23/19): LVEG 50%, trace MR, miltly thickened aortic valve leaflets, trace TR. TEE ([facility], 04/30/19): LVEF 55-60%, vegetation on tip of CVC in SVC, but normal valves without vegetation.  CT chest 05/06/19: 2.2cm RUL cavitating nodule, moderate b/l pleural effusions, anasarca CT chest 04/27/19: 3.1x 1.8cm masslike consolidation  RUL, b/l pleural effusions -Plan for repeat CT scan late March  -Labs on 05/27/2019: WBC 11, H/H 8.8/27.3, PLT 2/76.  ESR/CRP 62/59, normal BMP.  No comparison labs for review ENT evaluation 06/04/2019 for airway concern and MBS with abnormal findings on the trachea and cough.  Laryngoscopy with scattered mucus/crusting in the oro and hypopharynx and prominent crust in the subglottic airway, likely overlying inflammation related to recent endotracheal tube.  Daughters present today and assist in providing history regarding recent hospitalization.  She is otherwise a good historian.  They relate she had a brisk upper GI bleed requiring EGD with treatment of a "bleeding artery" with thermal therapy and clip placement.  No recurrence of bleeding.  Has not had any bleeding in the rehab facility.  No prior history of GI bleed.  Currently taking Protonix 40 mg/day.  She is otherwise without any abdominal pain, nausea, vomiting.  Good p.o. intake, except c/o of food being "disgusting" at the facility.  Currently GI complaint today is of constipation.  This is new since hospital discharge.  No prior history of constipation.  Is currently taking MiraLAX.  Increased straining to have BM and feels like "my pushing against a brick wall".   Past Medical History:  Diagnosis Date  . LBBB (left bundle branch block)   . MSSA bacteremia 2021  . RA (rheumatoid arthritis) ([place])   . UGIB (upper gastrointestinal bleed) 2021     Past Surgical History:  Procedure Laterality Date  . ABCESS DRAINAGE Bilateral 2021  . ABDOMINAL HYSTERECTOMY    .  BREAST ENHANCEMENT SURGERY    . CERVICAL FUSION    . COLONOSCOPY     about 5 years. Been doing cologuard  . ESOPHAGOGASTRODUODENOSCOPY     [place]   . EYE SURGERY Bilateral    lens replacement  . INCISION AND DRAINAGE OF WOUND Bilateral 2021   hip, thigh  . LUMBAR FUSION    . TONSILLECTOMY     Family History  Problem Relation Age of Onset  .  Asthma Mother   . Asthma Father   . COPD Father   . CAD Father   . Asthma Sister   . Breast cancer Paternal Aunt   . High Cholesterol Brother   . Colon cancer Neg Hx    Social History   Tobacco Use  . Smoking status: Former Smoker    Packs/day: 2.00    Years: 20.00    Pack years: 40.00    Types: Cigarettes    Quit date: 06/05/1982    Years since quitting: 37.0  . Smokeless tobacco: Never Used  Substance Use Topics  . Alcohol use: Not Currently  . Drug use: Never   Current Outpatient Medications  Medication Sig Dispense Refill  . Ascorbic Acid (VITAMIN C) 500 MG CAPS Take 500 mg by mouth daily.    . baclofen (LIORESAL) 10 MG tablet Take 10 mg by mouth 3 (three) times daily.    Marland Kitchen ceFAZolin (ANCEF) 1 g injection Inject 2 g into the vein every 8 (eight) hours.    . cycloSPORINE (RESTASIS) 0.05 % ophthalmic emulsion 1 drop 2 (two) times daily.    Marland Kitchen doxycycline (VIBRA-TABS) 100 MG tablet Take 100 mg by mouth 2 (two) times daily.    . DULoxetine (CYMBALTA) 20 MG capsule Take 20 mg by mouth daily.    Marland Kitchen estradiol (CLIMARA - DOSED IN MG/24 HR) 0.0375 mg/24hr patch Place 0.0375 mg onto the skin once a week.    . ferrous sulfate 325 (65 FE) MG tablet Take 325 mg by mouth daily with breakfast.    . folic acid (FOLVITE) 0.5 MG tablet Take 2 tablets by mouth daily.    Marland Kitchen gabapentin (NEURONTIN) 600 MG tablet Take 600 mg by mouth 3 (three) times daily.    . Melatonin 5 MG TABS Take by mouth.    . pantoprazole (PROTONIX) 40 MG tablet Take 40 mg by mouth daily.    . polyethylene glycol (MIRALAX / GLYCOLAX) 17 g packet Take 17 g by mouth daily.    . potassium chloride (KLOR-CON) 20 MEQ packet Take 20 mEq by mouth 2 (two) times daily.    Marland Kitchen tuberculin (TUBERSOL) 5 UNIT/0.1ML injection Inject into the skin once.     No current facility-administered medications for this visit.   Allergies  Allergen Reactions  . Iodinated Diagnostic Agents Anaphylaxis  . Shellfish Allergy Anaphylaxis  .  Shellfish-Derived Products Anaphylaxis     Review of Systems: All systems reviewed and negative except where noted in HPI.     Physical Exam:    Wt Readings from Last 3 Encounters:  06/11/19 165 lb 3 oz (74.9 kg)    BP 130/84   Pulse (!) 106   Temp (!) 97.1 F (36.2 C)   Ht 5' 2"  (1.575 m)   Wt 165 lb 3 oz (74.9 kg)   BMI 30.21 kg/m  Constitutional:  Pleasant, in no acute distress.  Sitting in wheelchair.  Well conversive Psychiatric: Normal mood and affect. Behavior is normal. Cardiovascular: Normal rate, regular  rhythm Pulmonary/chest: Effort normal and breath sounds normal. No wheezing, rales or rhonchi. Abdominal: Soft, nondistended, nontender. Bowel sounds active throughout. Neurological: Alert and oriented to person place and time.   ASSESSMENT AND PLAN;   1) Recent upper GI bleed 2) Presumed acute blood loss anemia By patient and family description, sounds most like ulcer with visible vessel requiring endoscopic therapy.  Additional etiologies include Dieulafoy lesion, AVMs, etc.  Plan for the following:  -EGD at [facility] in April to assess for appropriate healing (prescribed supplemental O2).  Gastric biopsies to rule out H. pylori as appropriate based on endoscopic appearance -Resume Protonix -Resume serial H/H checks in rehab facility as currently doing.  Limited labs reviewed today demonstrate stability  3) Constipation Clinical description seems most consistent with pelvic floor dyssynergia.  No prior history of constipation.  Plan for the following: -Resume MiraLAX as currently doing -Ensure adequate hydration and high-fiber diet -Anorectal Manometry.  If consistent with pelvic floor dyssynergia, plan for referral to PT for pelvic floor retraining/biofeedback -If ARM negative, either change to bowel regimen vs Sitz marker study vs colonoscopy  4) CRC screening: -Reports negative Cologuard earlier this year  The indications, risks, and benefits of EGD were  explained to the patient in detail. Risks include but are not limited to bleeding, perforation, adverse reaction to medications, and cardiopulmonary compromise. Sequelae include but are not limited to the possibility of surgery, hositalization, and mortality.  Discussed elevated periprocedural risks related to underlying recent acute medical issues.  The patient verbalized understanding and wished to proceed. All questions answered, referred to scheduler. Further recommendations pending results of the exam.   I spent 65 minutes of time, including in depth chart review, review of outside records, independent review of results as outlined above, communicating results with the patient directly, face-to-face time with the patient and her family, coordinating care, ordering studies and medications as appropriate, and documentation.       [place], DO, FACG  06/11/2019, 11:20 AM   No ref. provider found

## 2019-06-18 ENCOUNTER — Telehealth: Payer: Self-pay | Admitting: *Deleted

## 2019-06-19 ENCOUNTER — Other Ambulatory Visit: Payer: Self-pay

## 2019-06-19 ENCOUNTER — Ambulatory Visit
Admission: RE | Admit: 2019-06-19 | Discharge: 2019-06-19 | Disposition: A | Discharge status: Home / Self Care | Payer: Self-pay | Source: Ambulatory Visit | Attending: Internal Medicine | Admitting: Internal Medicine

## 2019-06-19 ENCOUNTER — Other Ambulatory Visit (HOSPITAL_COMMUNITY): Payer: Self-pay | Admitting: Internal Medicine

## 2019-06-19 ENCOUNTER — Encounter (HOSPITAL_COMMUNITY): Payer: Self-pay | Admitting: Family Medicine

## 2019-06-19 ENCOUNTER — Inpatient Hospital Stay
Admission: RE | Admit: 2019-06-19 | Discharge: 2019-06-19 | Disposition: A | Discharge status: Home / Self Care | Payer: Self-pay | Source: Ambulatory Visit | Attending: Internal Medicine | Admitting: Internal Medicine

## 2019-06-19 ENCOUNTER — Ambulatory Visit (HOSPITAL_COMMUNITY)
Admission: RE | Admit: 2019-06-19 | Discharge: 2019-06-19 | Disposition: A | Discharge status: Home / Self Care | Payer: Medicare PPO | Source: Ambulatory Visit | Attending: Internal Medicine | Admitting: Internal Medicine

## 2019-06-19 ENCOUNTER — Encounter (HOSPITAL_COMMUNITY): Payer: Self-pay

## 2019-06-19 ENCOUNTER — Inpatient Hospital Stay (HOSPITAL_COMMUNITY)
Admission: EM | Admit: 2019-06-19 | Discharge: 2019-07-01 | DRG: 286 | Disposition: A | Discharge status: Skilled Nursing Facility | Payer: Medicare PPO | Source: Skilled Nursing Facility | Attending: Internal Medicine | Admitting: Internal Medicine

## 2019-06-19 ENCOUNTER — Telehealth: Payer: Self-pay | Admitting: Infectious Disease

## 2019-06-19 DIAGNOSIS — C801 Malignant (primary) neoplasm, unspecified: Secondary | ICD-10-CM

## 2019-06-19 DIAGNOSIS — R946 Abnormal results of thyroid function studies: Secondary | ICD-10-CM | POA: Diagnosis present

## 2019-06-19 DIAGNOSIS — E8770 Fluid overload, unspecified: Secondary | ICD-10-CM | POA: Diagnosis present

## 2019-06-19 DIAGNOSIS — Z79899 Other long term (current) drug therapy: Secondary | ICD-10-CM

## 2019-06-19 DIAGNOSIS — J984 Other disorders of lung: Secondary | ICD-10-CM

## 2019-06-19 DIAGNOSIS — Z825 Family history of asthma and other chronic lower respiratory diseases: Secondary | ICD-10-CM

## 2019-06-19 DIAGNOSIS — Z87891 Personal history of nicotine dependence: Secondary | ICD-10-CM

## 2019-06-19 DIAGNOSIS — E872 Acidosis, unspecified: Secondary | ICD-10-CM

## 2019-06-19 DIAGNOSIS — Z6824 Body mass index (BMI) 24.0-24.9, adult: Secondary | ICD-10-CM

## 2019-06-19 DIAGNOSIS — Z803 Family history of malignant neoplasm of breast: Secondary | ICD-10-CM

## 2019-06-19 DIAGNOSIS — J9819 Other pulmonary collapse: Secondary | ICD-10-CM | POA: Diagnosis present

## 2019-06-19 DIAGNOSIS — R911 Solitary pulmonary nodule: Secondary | ICD-10-CM | POA: Insufficient documentation

## 2019-06-19 DIAGNOSIS — R601 Generalized edema: Secondary | ICD-10-CM | POA: Diagnosis not present

## 2019-06-19 DIAGNOSIS — E440 Moderate protein-calorie malnutrition: Secondary | ICD-10-CM | POA: Insufficient documentation

## 2019-06-19 DIAGNOSIS — J9611 Chronic respiratory failure with hypoxia: Secondary | ICD-10-CM | POA: Diagnosis present

## 2019-06-19 DIAGNOSIS — Z8701 Personal history of pneumonia (recurrent): Secondary | ICD-10-CM

## 2019-06-19 DIAGNOSIS — Z9889 Other specified postprocedural states: Secondary | ICD-10-CM

## 2019-06-19 DIAGNOSIS — Z9981 Dependence on supplemental oxygen: Secondary | ICD-10-CM

## 2019-06-19 DIAGNOSIS — G8929 Other chronic pain: Secondary | ICD-10-CM | POA: Diagnosis present

## 2019-06-19 DIAGNOSIS — I110 Hypertensive heart disease with heart failure: Secondary | ICD-10-CM | POA: Diagnosis not present

## 2019-06-19 DIAGNOSIS — I5021 Acute systolic (congestive) heart failure: Secondary | ICD-10-CM

## 2019-06-19 DIAGNOSIS — N049 Nephrotic syndrome with unspecified morphologic changes: Secondary | ICD-10-CM | POA: Diagnosis present

## 2019-06-19 DIAGNOSIS — D649 Anemia, unspecified: Secondary | ICD-10-CM

## 2019-06-19 DIAGNOSIS — G629 Polyneuropathy, unspecified: Secondary | ICD-10-CM | POA: Diagnosis present

## 2019-06-19 DIAGNOSIS — Z8249 Family history of ischemic heart disease and other diseases of the circulatory system: Secondary | ICD-10-CM

## 2019-06-19 DIAGNOSIS — E876 Hypokalemia: Secondary | ICD-10-CM | POA: Diagnosis not present

## 2019-06-19 DIAGNOSIS — L03116 Cellulitis of left lower limb: Secondary | ICD-10-CM | POA: Diagnosis present

## 2019-06-19 DIAGNOSIS — B9561 Methicillin susceptible Staphylococcus aureus infection as the cause of diseases classified elsewhere: Secondary | ICD-10-CM | POA: Diagnosis present

## 2019-06-19 DIAGNOSIS — J9620 Acute and chronic respiratory failure, unspecified whether with hypoxia or hypercapnia: Secondary | ICD-10-CM | POA: Diagnosis present

## 2019-06-19 DIAGNOSIS — L8990 Pressure ulcer of unspecified site, unspecified stage: Secondary | ICD-10-CM | POA: Insufficient documentation

## 2019-06-19 DIAGNOSIS — F329 Major depressive disorder, single episode, unspecified: Secondary | ICD-10-CM | POA: Diagnosis present

## 2019-06-19 DIAGNOSIS — Z20822 Contact with and (suspected) exposure to covid-19: Secondary | ICD-10-CM | POA: Diagnosis present

## 2019-06-19 DIAGNOSIS — I428 Other cardiomyopathies: Secondary | ICD-10-CM | POA: Diagnosis present

## 2019-06-19 DIAGNOSIS — M069 Rheumatoid arthritis, unspecified: Secondary | ICD-10-CM | POA: Diagnosis present

## 2019-06-19 DIAGNOSIS — J90 Pleural effusion, not elsewhere classified: Secondary | ICD-10-CM | POA: Diagnosis not present

## 2019-06-19 DIAGNOSIS — R7881 Bacteremia: Secondary | ICD-10-CM | POA: Diagnosis present

## 2019-06-19 DIAGNOSIS — R0600 Dyspnea, unspecified: Secondary | ICD-10-CM

## 2019-06-19 DIAGNOSIS — Z8719 Personal history of other diseases of the digestive system: Secondary | ICD-10-CM

## 2019-06-19 DIAGNOSIS — K219 Gastro-esophageal reflux disease without esophagitis: Secondary | ICD-10-CM | POA: Diagnosis present

## 2019-06-19 DIAGNOSIS — E871 Hypo-osmolality and hyponatremia: Secondary | ICD-10-CM | POA: Diagnosis present

## 2019-06-19 DIAGNOSIS — E441 Mild protein-calorie malnutrition: Secondary | ICD-10-CM | POA: Diagnosis present

## 2019-06-19 DIAGNOSIS — L02415 Cutaneous abscess of right lower limb: Secondary | ICD-10-CM | POA: Diagnosis present

## 2019-06-19 DIAGNOSIS — J45909 Unspecified asthma, uncomplicated: Secondary | ICD-10-CM | POA: Diagnosis present

## 2019-06-19 DIAGNOSIS — Z91041 Radiographic dye allergy status: Secondary | ICD-10-CM

## 2019-06-19 DIAGNOSIS — D539 Nutritional anemia, unspecified: Secondary | ICD-10-CM | POA: Diagnosis present

## 2019-06-19 DIAGNOSIS — E874 Mixed disorder of acid-base balance: Secondary | ICD-10-CM | POA: Diagnosis not present

## 2019-06-19 DIAGNOSIS — R0602 Shortness of breath: Secondary | ICD-10-CM

## 2019-06-19 DIAGNOSIS — Z91013 Allergy to seafood: Secondary | ICD-10-CM

## 2019-06-19 DIAGNOSIS — R000 Tachycardia, unspecified: Secondary | ICD-10-CM | POA: Diagnosis present

## 2019-06-19 LAB — CBC WITH DIFFERENTIAL/PLATELET
Abs Immature Granulocytes: 0.04 10*3/uL (ref 0.00–0.07)
Basophils Absolute: 0 10*3/uL (ref 0.0–0.1)
Basophils Relative: 0 %
Eosinophils Absolute: 0 10*3/uL (ref 0.0–0.5)
Eosinophils Relative: 0 %
HCT: 31.5 % — ABNORMAL LOW (ref 36.0–46.0)
Hemoglobin: 9.7 g/dL — ABNORMAL LOW (ref 12.0–15.0)
Immature Granulocytes: 1 %
Lymphocytes Relative: 35 %
Lymphs Abs: 2.2 10*3/uL (ref 0.7–4.0)
MCH: 31.8 pg (ref 26.0–34.0)
MCHC: 30.8 g/dL (ref 30.0–36.0)
MCV: 103.3 fL — ABNORMAL HIGH (ref 80.0–100.0)
Monocytes Absolute: 0.2 10*3/uL (ref 0.1–1.0)
Monocytes Relative: 4 %
Neutro Abs: 3.8 10*3/uL (ref 1.7–7.7)
Neutrophils Relative %: 60 %
Platelets: 360 10*3/uL (ref 150–400)
RBC: 3.05 MIL/uL — ABNORMAL LOW (ref 3.87–5.11)
RDW: 17.1 % — ABNORMAL HIGH (ref 11.5–15.5)
WBC: 6.3 10*3/uL (ref 4.0–10.5)
nRBC: 0 % (ref 0.0–0.2)

## 2019-06-19 LAB — URINALYSIS, ROUTINE W REFLEX MICROSCOPIC
Bilirubin Urine: NEGATIVE
Glucose, UA: 50 mg/dL — AB
Hgb urine dipstick: NEGATIVE
Ketones, ur: 5 mg/dL — AB
Nitrite: NEGATIVE
Protein, ur: 30 mg/dL — AB
Specific Gravity, Urine: >1.046 — ABNORMAL HIGH (ref 1.005–1.030)
WBC, UA: >50 WBC/hpf — ABNORMAL HIGH (ref 0–5)
pH: 6 (ref 5.0–8.0)

## 2019-06-19 LAB — COMPREHENSIVE METABOLIC PANEL
ALT: 6 U/L (ref 0–44)
AST: 25 U/L (ref 15–41)
Albumin: 1.8 g/dL — ABNORMAL LOW (ref 3.5–5.0)
Alkaline Phosphatase: 145 U/L — ABNORMAL HIGH (ref 38–126)
Anion gap: 10 (ref 5–15)
BUN: 10 mg/dL (ref 8–23)
CO2: 27 mmol/L (ref 22–32)
Calcium: 8.1 mg/dL — ABNORMAL LOW (ref 8.9–10.3)
Chloride: 99 mmol/L (ref 98–111)
Creatinine, Ser: 0.68 mg/dL (ref 0.44–1.00)
GFR calc Af Amer: >60 mL/min (ref >60)
GFR calc non Af Amer: >60 mL/min (ref >60)
Glucose, Bld: 163 mg/dL — ABNORMAL HIGH (ref 70–99)
Potassium: 3.8 mmol/L (ref 3.5–5.1)
Sodium: 136 mmol/L (ref 135–145)
Total Bilirubin: 0.8 mg/dL (ref 0.3–1.2)
Total Protein: 6.3 g/dL — ABNORMAL LOW (ref 6.5–8.1)

## 2019-06-19 LAB — PROTIME-INR
INR: 1.1 (ref 0.8–1.2)
Prothrombin Time: 13.6 seconds (ref 11.4–15.2)

## 2019-06-19 LAB — ABO/RH: ABO/RH(D): A POS

## 2019-06-19 LAB — TYPE AND SCREEN
ABO/RH(D): A POS
Antibody Screen: NEGATIVE

## 2019-06-19 LAB — RESPIRATORY PANEL BY RT PCR (FLU A&B, COVID)
Influenza A by PCR: NEGATIVE
Influenza B by PCR: NEGATIVE
SARS Coronavirus 2 by RT PCR: NEGATIVE

## 2019-06-19 LAB — TROPONIN I (HIGH SENSITIVITY): Troponin I (High Sensitivity): 12 ng/L (ref <18)

## 2019-06-19 LAB — BRAIN NATRIURETIC PEPTIDE: B Natriuretic Peptide: 1843.9 pg/mL — ABNORMAL HIGH (ref 0.0–100.0)

## 2019-06-19 MED ORDER — SODIUM CHLORIDE 0.9% FLUSH: 3.0000 mL | INTRAVENOUS | Status: DC | PRN

## 2019-06-19 MED ORDER — POLYETHYLENE GLYCOL 3350 17 G PO PACK
17.0000 g | PACK | Freq: Every day | ORAL | Status: DC | PRN
  Administered 2019-06-21 – 2019-07-01 (×3): 17 g via ORAL
  Filled 2019-06-19 (×2): qty 1

## 2019-06-19 MED ORDER — CYCLOSPORINE 0.05 % OP EMUL
1.0000 [drp] | Freq: Two times a day (BID) | OPHTHALMIC | Status: DC
  Administered 2019-06-20 – 2019-07-01 (×23): 1 [drp] via OPHTHALMIC
  Filled 2019-06-19 (×27): qty 1

## 2019-06-19 MED ORDER — SODIUM CHLORIDE (PF) 0.9 % IJ SOLN
INTRAMUSCULAR | Status: AC
  Filled 2019-06-19: qty 50

## 2019-06-19 MED ORDER — SODIUM CHLORIDE 0.9% FLUSH
3.0000 mL | Freq: Two times a day (BID) | INTRAVENOUS | Status: DC
  Administered 2019-06-20 – 2019-06-30 (×17): 3 mL via INTRAVENOUS

## 2019-06-19 MED ORDER — IOHEXOL 350 MG/ML SOLN: 75.0000 mL | Freq: Once | INTRAVENOUS | Status: DC | PRN

## 2019-06-19 MED ORDER — FOLIC ACID 1 MG PO TABS
2.0000 mg | ORAL_TABLET | Freq: Every day | ORAL | Status: DC
  Administered 2019-06-20 – 2019-07-01 (×12): 2 mg via ORAL
  Filled 2019-06-19 (×12): qty 2

## 2019-06-19 MED ORDER — BACLOFEN 10 MG PO TABS
10.0000 mg | ORAL_TABLET | Freq: Three times a day (TID) | ORAL | Status: DC | PRN
  Filled 2019-06-19: qty 1

## 2019-06-19 MED ORDER — CEFAZOLIN SODIUM-DEXTROSE 2-4 GM/100ML-% IV SOLN
2.0000 g | Freq: Three times a day (TID) | INTRAVENOUS | Status: AC
  Administered 2019-06-20 – 2019-06-25 (×19): 2 g via INTRAVENOUS
  Filled 2019-06-19 (×20): qty 100

## 2019-06-19 MED ORDER — MELATONIN 3 MG PO TABS
6.0000 mg | ORAL_TABLET | Freq: Every day | ORAL | Status: DC
  Administered 2019-06-20 – 2019-06-30 (×12): 6 mg via ORAL
  Filled 2019-06-19 (×14): qty 2

## 2019-06-19 MED ORDER — GABAPENTIN 600 MG PO TABS
600.0000 mg | ORAL_TABLET | Freq: Three times a day (TID) | ORAL | Status: DC
  Administered 2019-06-20 – 2019-07-01 (×36): 600 mg via ORAL
  Filled 2019-06-19 (×38): qty 1

## 2019-06-19 MED ORDER — ONDANSETRON HCL 4 MG/2ML IJ SOLN: 4.0000 mg | Freq: Four times a day (QID) | INTRAMUSCULAR | Status: DC | PRN

## 2019-06-19 MED ORDER — SODIUM CHLORIDE 0.9 % IV SOLN: 250.0000 mL | INTRAVENOUS | Status: DC | PRN

## 2019-06-19 MED ORDER — ACETAMINOPHEN 650 MG RE SUPP: 650.0000 mg | Freq: Four times a day (QID) | RECTAL | Status: DC | PRN

## 2019-06-19 MED ORDER — DOXYCYCLINE HYCLATE 100 MG PO TABS
100.0000 mg | ORAL_TABLET | Freq: Two times a day (BID) | ORAL | Status: DC
  Administered 2019-06-20 – 2019-06-29 (×20): 100 mg via ORAL
  Filled 2019-06-19 (×20): qty 1

## 2019-06-19 MED ORDER — SODIUM CHLORIDE 0.9% FLUSH
3.0000 mL | Freq: Two times a day (BID) | INTRAVENOUS | Status: DC
  Administered 2019-06-20 – 2019-07-01 (×8): 3 mL via INTRAVENOUS

## 2019-06-19 MED ORDER — ONDANSETRON HCL 4 MG PO TABS: 4.0000 mg | ORAL_TABLET | Freq: Four times a day (QID) | ORAL | Status: DC | PRN

## 2019-06-19 MED ORDER — DULOXETINE HCL 60 MG PO CPEP
60.0000 mg | ORAL_CAPSULE | Freq: Every day | ORAL | Status: DC
  Administered 2019-06-20 – 2019-07-01 (×12): 60 mg via ORAL
  Filled 2019-06-19 (×12): qty 1

## 2019-06-19 MED ORDER — IOHEXOL 300 MG/ML  SOLN
75.0000 mL | Freq: Once | INTRAMUSCULAR | Status: AC | PRN
  Administered 2019-06-19: 75 mL via INTRAVENOUS

## 2019-06-19 MED ORDER — METOPROLOL TARTRATE 25 MG PO TABS
25.0000 mg | ORAL_TABLET | Freq: Two times a day (BID) | ORAL | Status: DC
  Administered 2019-06-20 – 2019-06-23 (×8): 25 mg via ORAL
  Filled 2019-06-19 (×8): qty 1

## 2019-06-19 MED ORDER — ENOXAPARIN SODIUM 40 MG/0.4ML ~~LOC~~ SOLN
40.0000 mg | SUBCUTANEOUS | Status: DC
  Administered 2019-06-20 – 2019-06-26 (×8): 40 mg via SUBCUTANEOUS
  Filled 2019-06-19 (×8): qty 0.4

## 2019-06-19 MED ORDER — PANTOPRAZOLE SODIUM 40 MG PO TBEC
40.0000 mg | DELAYED_RELEASE_TABLET | Freq: Two times a day (BID) | ORAL | Status: DC
  Administered 2019-06-20 – 2019-07-01 (×24): 40 mg via ORAL
  Filled 2019-06-19 (×24): qty 1

## 2019-06-19 MED ORDER — ACETAMINOPHEN 325 MG PO TABS
650.0000 mg | ORAL_TABLET | Freq: Four times a day (QID) | ORAL | Status: DC | PRN
  Filled 2019-06-19 (×2): qty 2

## 2019-06-19 MED ORDER — MELATONIN 5 MG PO TABS
5.0000 mg | ORAL_TABLET | Freq: Every day | ORAL | Status: DC
  Filled 2019-06-19: qty 1

## 2019-06-19 NOTE — H&P (Signed)
History and Physical    Samantha Keller XBJ:[id number] DOB: 02/21/1946 DOA: 06/19/2019  PCP: Patient, [name]   Patient coming from: SNF   Chief Complaint: Abnormal CT chest   HPI: Samantha Keller is a 74 y.o. female with medical history significant for rheumatoid arthritis previously on Humira, chronic pain, and complicated hospitalization in [place] beginning in late January and involving MSSA bacteremia, now presenting to the ED for evaluation of concerning CT chest findings.  Patient had CT chest performed today with large bilateral pleural effusions, complete collapse of left lower lobe, partial collapse of the lingula, and spiculated nodule in the right upper lobe.  She was directed to the ED for evaluation of this.  Patient reports chronic dyspnea and cough but has not appreciated any acute change and denies any recent fevers, chills, chest pain, or palpitations.  Patient had a complicated hospitalization beginning in late January when she presented with fevers, confusion, and hypoxia, and was found to be hyponatremic with AKI, right upper lobe mass concerning for pneumonia versus cancer, MSSA bacteremia, lower extremity cellulitis with abscess, and upper GI bleed.  She had TEE negative for vegetation, had vegetation at the tip of her central line, underwent I&D abscess, and was eventually discharged to a local SNF with PICC for long course of cefazolin.  She has since established with local GI, pulmonology, and ID, and had an outpatient CT chest performed today with findings that prompted her current presentation.  ED Course: Upon arrival to the ED, patient is found to be afebrile, saturating 100% on 2 L/min of supplemental oxygen, slightly tachycardic, and with stable blood pressure.  EKG features sinus rhythm with LVH, secondary repolarization abnormality, and QTc interval 501 ms.  Chemistry panel notable for albumin of 1.8.  CBC with hemoglobin of 9.7 and MCV 103.3.  BNP is  elevated to 1844.  Troponin and INR are normal.  Infectious disease was consulted by the ED physician, influenza and Covid PCR are in process, and hospitalist asked to admit.  Review of Systems:  All other systems reviewed and apart from HPI, are negative.  Past Medical History:  Diagnosis Date  . Abscess of bursa of right hip   . Acute kidney failure ([place])   . Alcohol withdrawal ([place])   . Asthma   . Cellulitis of unspecified part of limb   . Chronic tension type headache   . Depression   . Dysphagia, oropharyngeal phase   . Essential (primary) hypertension   . Hyperosmolality and/or hypernatremia   . Hypovolemic shock ([place])   . Iron deficiency anemia   . LBBB (left bundle branch block)   . Malignant neoplasm of upper lobe bronchus, left ([place])   . Metabolic encephalopathy   . MSSA bacteremia 2021  . Muscle weakness (generalized)   . Peritoneal abscess ([place])   . Pneumonia   . RA (rheumatoid arthritis) ([place])   . Respiratory failure ([place])   . Staph infection   . UGIB (upper gastrointestinal bleed) 2021  . Unspecified protein-calorie malnutrition ([place])     Past Surgical History:  Procedure Laterality Date  . ABCESS DRAINAGE Bilateral 2021  . ABDOMINAL HYSTERECTOMY    . BREAST ENHANCEMENT SURGERY    . CERVICAL FUSION    . COLONOSCOPY     about 5 years ago. Been doing cologuard  . ESOPHAGOGASTRODUODENOSCOPY     [place]   . EYE SURGERY Bilateral    lens replacement  . INCISION  AND DRAINAGE OF WOUND Bilateral 2021   hip, thigh  . LUMBAR FUSION    . TONSILLECTOMY       reports that she quit smoking about 37 years ago. Her smoking use included cigarettes. She has a 40.00 pack-year smoking history. She has never used smokeless tobacco. She reports previous alcohol use. She reports that she does not use drugs.  Allergies  Allergen Reactions  . Iodinated Diagnostic Agents Anaphylaxis  . Shellfish Allergy Anaphylaxis  . Shellfish-Derived Products  Anaphylaxis    Family History  Problem Relation Age of Onset  . Asthma Mother   . Asthma Father   . COPD Father   . CAD Father   . Asthma Sister   . Breast cancer Paternal Aunt   . High Cholesterol Brother   . Colon cancer Neg Hx      Prior to Admission medications   Medication Sig Start Date End Date Taking? Authorizing Provider  Ascorbic Acid (VITAMIN C) 500 MG CAPS Take 500 mg by mouth daily.   Yes [provider]  baclofen (LIORESAL) 10 MG tablet Take 10 mg by mouth 3 (three) times daily as needed.    Yes [provider]  ceFAZolin (ANCEF) 1 g injection Inject 2 g into the vein every 8 (eight) hours.   Yes [provider]  cycloSPORINE (RESTASIS) 0.05 % ophthalmic emulsion Place 1 drop into both eyes 2 (two) times daily.    Yes [provider]  doxycycline (VIBRA-TABS) 100 MG tablet Take 100 mg by mouth 2 (two) times daily.   Yes [provider]  DULoxetine (CYMBALTA) 60 MG capsule Take 60 mg by mouth daily.   Yes [provider]  estradiol (CLIMARA - DOSED IN MG/24 HR) 0.0375 mg/24hr patch Place 0.0375 mg onto the skin as directed. Apply one patch to skin two times a week on sundays and wednesdays   Yes [provider]  ferrous sulfate 325 (65 FE) MG tablet Take 325 mg by mouth daily with breakfast.   Yes [provider]  folic acid (FOLVITE) 0.5 MG tablet Take 2 tablets by mouth daily.   Yes [provider]  gabapentin (NEURONTIN) 600 MG tablet Take 600 mg by mouth 3 (three) times daily.   Yes [provider]  Melatonin 5 MG TABS Take 1 tablet by mouth at bedtime.    Yes [provider]  metoprolol tartrate (LOPRESSOR) 25 MG tablet Take 25 mg by mouth 2 (two) times daily.   Yes [provider]  pantoprazole (PROTONIX) 40 MG tablet Take 40 mg by mouth 2 (two) times daily.    Yes [provider]  polyethylene glycol (MIRALAX / GLYCOLAX) 17 g packet Take 17 g by mouth  daily as needed for mild constipation.    Yes [provider]    Physical Exam: Vitals:   06/19/19 2030 06/19/19 2100 06/19/19 2130 06/19/19 2200  BP: 129/80 131/74 128/79 130/79  Pulse: (!) 102 (!) 101 (!) 101 (!) 102  Resp: 16 18 (!) 22 18  Temp:      TempSrc:      SpO2: 100% 100% 100% 100%    Constitutional: NAD, calm  Eyes: PERTLA, lids and conjunctivae normal ENMT: Mucous membranes are moist. Posterior pharynx clear of any exudate or lesions.   Neck: normal, supple, no masses, no thyromegaly Respiratory: diminished breath sounds bilaterally, no wheezing. No accessory muscle use.  Cardiovascular: S1 & S2 heard, regular rate and rhythm. Bilateral leg swelling to hips.  Abdomen: No distension, no tenderness, soft. Bowel sounds active.  Musculoskeletal: no clubbing / cyanosis. No joint deformity upper and lower extremities.   Skin: Superficial excoriations bilateral LEs. Warm, dry, well-perfused. Neurologic: no gross facial asymmetry. Sensation intact. Moving all extremities.  Psychiatric: Alert and oriented to person, place, and situation. Pleasant and cooperative.    Labs and Imaging on Admission: I have personally reviewed following labs and imaging studies  CBC: Recent Labs  Lab 06/19/19 1935  WBC 6.3  NEUTROABS 3.8  HGB 9.7*  HCT 31.5*  MCV 103.3*  PLT 811   Basic Metabolic Panel: Recent Labs  Lab 06/19/19 1935  NA 136  K 3.8  CL 99  CO2 27  GLUCOSE 163*  BUN 10  CREATININE 0.68  CALCIUM 8.1*   GFR: Estimated Creatinine Clearance: 60.2 mL/min (by C-G formula based on SCr of 0.68 mg/dL). Liver Function Tests: Recent Labs  Lab 06/19/19 1935  AST 25  ALT 6  ALKPHOS 145*  BILITOT 0.8  PROT 6.3*  ALBUMIN 1.8*   No results for input(s): LIPASE, AMYLASE in the last 168 hours. No results for input(s): AMMONIA in the last 168 hours. Coagulation Profile: Recent Labs  Lab 06/19/19 1935  INR 1.1   Cardiac Enzymes: No results for input(s):  CKTOTAL, CKMB, CKMBINDEX, TROPONINI in the last 168 hours. BNP (last 3 results) No results for input(s): PROBNP in the last 8760 hours. HbA1C: No results for input(s): HGBA1C in the last 72 hours. CBG: No results for input(s): GLUCAP in the last 168 hours. Lipid Profile: No results for input(s): CHOL, HDL, LDLCALC, TRIG, CHOLHDL, LDLDIRECT in the last 72 hours. Thyroid Function Tests: No results for input(s): TSH, T4TOTAL, FREET4, T3FREE, THYROIDAB in the last 72 hours. Anemia Panel: No results for input(s): VITAMINB12, FOLATE, FERRITIN, TIBC, IRON, RETICCTPCT in the last 72 hours. Urine analysis: No results found for: COLORURINE, APPEARANCEUR, LABSPEC, PHURINE, GLUCOSEU, HGBUR, BILIRUBINUR, KETONESUR, PROTEINUR, UROBILINOGEN, NITRITE, LEUKOCYTESUR Sepsis Labs: @LABRCNTIP (procalcitonin:4,lacticidven:4) ) Recent Results (from the past 240 hour(s))  Respiratory Panel by RT PCR (Flu A&B, Covid) - Nasopharyngeal Swab     Status: None   Collection Time: 06/19/19  8:24 PM   Specimen: Nasopharyngeal Swab  Result Value Ref Range Status   SARS Coronavirus 2 by RT PCR NEGATIVE NEGATIVE Final    Comment: (NOTE) SARS-CoV-2 target nucleic acids are NOT DETECTED. The SARS-CoV-2 RNA is generally detectable in upper respiratoy specimens during the acute phase of infection. The lowest concentration of SARS-CoV-2 viral copies this assay can detect is 131 copies/mL. A negative result does not preclude SARS-Cov-2 infection and should not be used as the sole basis for treatment or other patient management decisions. A negative result may occur with  improper specimen collection/handling, submission of specimen other than nasopharyngeal swab, presence of viral mutation(s) within the areas targeted by this assay, and inadequate number of viral copies (<131 copies/mL). A negative result must be combined with clinical observations, patient history, and epidemiological information. The expected result is  Negative. Fact Sheet for Patients:  PinkCheek.be Fact Sheet for Healthcare Providers:  GravelBags.it This test is not yet ap proved or cleared by the [place] FDA and  has been authorized for detection and/or diagnosis of SARS-CoV-2 by FDA under an Emergency Use Authorization (EUA). This EUA will remain  in effect (meaning this test can be used) for the duration of the COVID-19 declaration under Section 564(b)(1) of the Act, 21 U.S.C. section 360bbb-3(b)(1), unless the authorization is terminated or revoked sooner.  Influenza A by PCR NEGATIVE NEGATIVE Final   Influenza B by PCR NEGATIVE NEGATIVE Final    Comment: (NOTE) The Xpert Xpress SARS-CoV-2/FLU/RSV assay is intended as an aid in  the diagnosis of influenza from Nasopharyngeal swab specimens and  should not be used as a sole basis for treatment. Nasal washings and  aspirates are unacceptable for Xpert Xpress SARS-CoV-2/FLU/RSV  testing. Fact Sheet for Patients: PinkCheek.be Fact Sheet for Healthcare Providers: GravelBags.it This test is not yet approved or cleared by the [place] FDA and  has been authorized for detection and/or diagnosis of SARS-CoV-2 by  FDA under an Emergency Use Authorization (EUA). This EUA will remain  in effect (meaning this test can be used) for the duration of the  Covid-19 declaration under Section 564(b)(1) of the Act, 21  U.S.C. section 360bbb-3(b)(1), unless the authorization is  terminated or revoked. Performed at [facility], [place] [address], [place], [place] [address]      Radiological Exams on Admission: CT CHEST W CONTRAST  Result Date: 06/19/2019 CLINICAL DATA:  Abnormal x-ray. EXAM: CT CHEST WITH CONTRAST TECHNIQUE: Multidetector CT imaging of the chest was performed during intravenous contrast administration. CONTRAST:  57mL OMNIPAQUE IOHEXOL 300 MG/ML   SOLN COMPARISON:  None. FINDINGS: Cardiovascular: Heart size is enlarged without pericardial effusion. Aortic caliber is normal. Left vertebral arising directly from the aortic arch. Central pulmonary arteries are engorged. Main pulmonary artery measuring approximately 2.9 cm. Not well evaluated given bolus timing. Mediastinum/Nodes: No adenopathy in the mediastinum or hilar areas with no axillary lymphadenopathy. Lungs/Pleura: Large bilateral pleural effusions. Spiculated nodule in the right upper lobe measures 12 by 11 mm (image 30, series 7) there is complete collapse of the left lower lobe and partial collapse of the lingula in the left chest. Partial collapse of the right lower lobe due to bilateral pleural effusions. Variable enhancement of collapsed lung at the lung bases also raising the question pneumonia with fluid in distal bronchi. Large airways are patent. Upper Abdomen: Incidental imaging of upper abdominal contents is unremarkable. Musculoskeletal: Diffuse body wall edema. Ruptured left breast implant with peripheral calcification. No chest wall mass. Signs of cervical spinal fusion with anterolisthesis of C7 on T1 and extensive degenerative changes at the cervicothoracic junction. No destructive bone finding. IMPRESSION: 1. Large bilateral pleural effusions with complete collapse of the left lower lobe and partial collapse of the lingula in the left chest. No current access to prior imaging which would allow for comparison. Currently in the process of attempting to retrieve prior imaging studies. 2. Variable enhancement of collapsed lung at the lung bases also raising the question of pneumonia with fluid in distal bronchi. This raises the question of current or prior pneumonia. 3. Spiculated nodule in the right upper lobe measures 12 x 11 mm. This is suspicious for malignancy. Though by report the patient has a history of a nodule which may have been improving. 4. Anasarca with body wall edema in  addition to effusions. These results will be called to the ordering clinician or representative by the Radiologist Assistant, and communication documented in the PACS or [employer]. Electronically Signed   By: Zetta Bills M.D.   On: 06/19/2019 17:12    EKG: Independently reviewed. Sinus rhythm, LVH with secondary repolarization abnormality, QTc 501 ms.   Assessment/Plan   1. Large pleural effusions; right lung nodule; chronic hypoxic respiratory failure   - Patient has been on cefazolin at her SNF for MSSA bacteremia secondary to LE  cellulitis vs PNA and had follow-up chest CT today, was found to have large bilateral pleural effusions with complete LLL collapse, partial collapse of lingula, and spiculated RUL nodule  - She is stable on her usual 2 Lpm and denies any recent change in chronic cough and dyspnea  - Consult IR for thoracentesis with fluid analysis including cytology   2. MSSA bacteremia   - Admitted to a hospital in [place] in late January with MSSA bacteremia, suspected PNA, and LE cellulitis with abscess  - She had TEE without vegetation; there was vegetation at tip of central line  - She has been on cefazolin with end date 06/25/2019; she is also on doxycycline  - ID consulted by ED physician and much appreciate  - No fever or leukocytosis on admission  - Continue cefazolin and doxy, repeat blood cultures   3. Anemia; recent UGIB  - Hgb is 9.7 in ED, improved from earlier this month   - She is established with local GI and planned for EGD in April  - Continue Protonix   4. Rheumatoid arthritis; chronic pain  - Continue Cymbalta, gabapentin, as-needed baclofen     DVT prophylaxis: Lovenox Code Status: Full  Family Communication: Daughter updated at bedside Disposition Plan: Likely back to SNF pending thoracentesis, repeat CXR, and ID consultation  Consults called: ID consulted by ED physician  Admission status: Observation     [name], MD [facility] Pager: See [email or web address]  If 7AM-7PM, please contact the daytime attending [email or web address]  06/19/2019, 10:51 PM

## 2019-06-19 NOTE — Telephone Encounter (Signed)
RN alerted to patients IV contrast allergy by [facility]. They provided premedication protocol to be relayed to [facility]. RN relayed verbal and faxed signed orders to [place], emphasizing the importance of timing for medication administration. Copy placed at front for scanning into chart. Landis Gandy, RN

## 2019-06-19 NOTE — Telephone Encounter (Signed)
I was called by Radiology re critical finding of large pleural effusions with complete collapse of LLL and part of LML  Scan report not visible. Called pt  But she is at rehab facility. Called daughter and now called rehab. Asked they transport to [facility] or [facility] for thoracentessi

## 2019-06-19 NOTE — ED Triage Notes (Signed)
BIB EMS from [facility]. Had CT Chest done as OP earlier today - showing large pleural effusions. Sent here for thoracentesis.

## 2019-06-19 NOTE — ED Provider Notes (Signed)
[facility] DEPARTMENT Provider Note   CSN: [id number] Arrival date & time: 06/19/19  1910     History Chief Complaint  Patient presents with  . Abnormal EKG    Samantha Keller is a 74 y.o. female.  74 year old female with prior medical history as detailed below presents for evaluation of pleural effusions.  Patient had a CT scan of the chest done today as an outpatient.  This demonstrated bilateral pleural effusions left greater than right.  Patient is currently on prolonged course of Ancef for treatment of MSSA bacteremia.  She is currently being followed by ID.  ID referred her to the ED after the CT scan was completed earlier this afternoon.  Patient does report gradually worsening mild shortness of breath over the last several days.  She is currently on 2 L nasal cannula O2 and is comfortable.  The history is provided by the patient, medical records and a relative.  Illness Location:  Pleural effusions, dyspnea Severity:  Mild Onset quality:  Gradual Timing:  Constant Progression:  Worsening Chronicity:  New Associated symptoms: shortness of breath        Past Medical History:  Diagnosis Date  . Abscess of bursa of right hip   . Acute kidney failure ([place])   . Alcohol withdrawal ([place])   . Asthma   . Cellulitis of unspecified part of limb   . Chronic tension type headache   . Depression   . Dysphagia, oropharyngeal phase   . Essential (primary) hypertension   . Hyperosmolality and/or hypernatremia   . Hypovolemic shock ([place])   . Iron deficiency anemia   . LBBB (left bundle branch block)   . Malignant neoplasm of upper lobe bronchus, left ([place])   . Metabolic encephalopathy   . MSSA bacteremia 2021  . Muscle weakness (generalized)   . Peritoneal abscess ([place])   . Pneumonia   . RA (rheumatoid arthritis) ([place])   . Respiratory failure ([place])   . Staph infection   . UGIB (upper gastrointestinal bleed) 2021  . Unspecified protein-calorie  malnutrition [facility])     Patient Active Problem List   Diagnosis Date Noted  . Bacteremia due to methicillin susceptible Staphylococcus aureus (MSSA) 05/27/2019  . Cellulitis and abscess of lower extremity 05/27/2019  . History of GI bleed 05/27/2019  . Cavitary lesion of lung 05/27/2019  . Rheumatoid arthritis ([place]) 05/27/2019  . Anemia 05/27/2019    Past Surgical History:  Procedure Laterality Date  . ABCESS DRAINAGE Bilateral 2021  . ABDOMINAL HYSTERECTOMY    . BREAST ENHANCEMENT SURGERY    . CERVICAL FUSION    . COLONOSCOPY     about 5 years ago. Been doing cologuard  . ESOPHAGOGASTRODUODENOSCOPY     [place]   . EYE SURGERY Bilateral    lens replacement  . INCISION AND DRAINAGE OF WOUND Bilateral 2021   hip, thigh  . LUMBAR FUSION    . TONSILLECTOMY       OB History   No obstetric history on file.     Family History  Problem Relation Age of Onset  . Asthma Mother   . Asthma Father   . COPD Father   . CAD Father   . Asthma Sister   . Breast cancer Paternal Aunt   . High Cholesterol Brother   . Colon cancer Neg Hx     Social History   Tobacco Use  . Smoking status: Former Smoker    Packs/day:  2.00    Years: 20.00    Pack years: 40.00    Types: Cigarettes    Quit date: 06/05/1982    Years since quitting: 37.0  . Smokeless tobacco: Never Used  Substance Use Topics  . Alcohol use: Not Currently  . Drug use: Never    Home Medications Prior to Admission medications   Medication Sig Start Date End Date Taking? Authorizing Provider  Ascorbic Acid (VITAMIN C) 500 MG CAPS Take 500 mg by mouth daily.    [provider]  baclofen (LIORESAL) 10 MG tablet Take 10 mg by mouth 3 (three) times daily as needed.     [provider]  ceFAZolin (ANCEF) 1 g injection Inject 2 g into the vein every 8 (eight) hours.    [provider]  cycloSPORINE (RESTASIS) 0.05 % ophthalmic emulsion 1 drop 2 (two) times daily.     [provider]  doxycycline (VIBRA-TABS) 100 MG tablet Take 100 mg by mouth 2 (two) times daily.    [provider]  DULoxetine (CYMBALTA) 60 MG capsule Take 60 mg by mouth daily.    [provider]  estradiol (CLIMARA - DOSED IN MG/24 HR) 0.0375 mg/24hr patch Place 0.0375 mg onto the skin as directed. Apply one patch to skin two times a week on sundays and wednesdays    [provider]  ferrous sulfate 325 (65 FE) MG tablet Take 325 mg by mouth daily with breakfast.    [provider]  folic acid (FOLVITE) 0.5 MG tablet Take 2 tablets by mouth daily.    [provider]  gabapentin (NEURONTIN) 600 MG tablet Take 600 mg by mouth 3 (three) times daily.    [provider]  Melatonin 5 MG TABS Take 1 tablet by mouth at bedtime.     [provider]  metoprolol succinate (TOPROL-XL) 25 MG 24 hr tablet Take 12.5 mg by mouth 2 (two) times daily.    [provider]  pantoprazole (PROTONIX) 40 MG tablet Take 40 mg by mouth 2 (two) times daily.     [provider]  polyethylene glycol (MIRALAX / GLYCOLAX) 17 g packet Take 17 g by mouth daily as needed.     [provider]  potassium chloride (KLOR-CON) 20 MEQ packet Take 20 mEq by mouth 2 (two) times daily.    [provider]    Allergies    Iodinated diagnostic agents, Shellfish allergy, and Shellfish-derived products  Review of Systems   Review of Systems  Respiratory: Positive for shortness of breath.   All other systems reviewed and are negative.   Physical Exam Updated Vital Signs BP 134/84   Pulse (!) 102   Temp 97.7 F (36.5 C) (Oral)   Resp 19   SpO2 100%   Physical Exam Vitals and nursing note reviewed.  Constitutional:      General: She is not in acute distress.    Appearance: Normal appearance. She is well-developed.  HENT:     Head: Normocephalic and atraumatic.  Eyes:     Conjunctiva/sclera: Conjunctivae normal.      Pupils: Pupils are equal, round, and reactive to light.  Cardiovascular:     Rate and Rhythm: Normal rate and regular rhythm.     Heart sounds: Normal heart sounds.  Pulmonary:     Effort: Pulmonary effort is normal. No respiratory distress.     Breath sounds: Normal breath sounds.     Comments: Decreased breath sounds at bilateral bases Abdominal:  General: There is no distension.     Palpations: Abdomen is soft.     Tenderness: There is no abdominal tenderness.  Musculoskeletal:        General: No deformity. Normal range of motion.     Cervical back: Normal range of motion and neck supple.     Right lower leg: Edema present.     Left lower leg: Edema present.  Skin:    General: Skin is warm and dry.  Neurological:     General: No focal deficit present.     Mental Status: She is alert and oriented to person, place, and time.     ED Results / Procedures / Treatments   Labs (all labs ordered are listed, but only abnormal results are displayed) Labs Reviewed  COMPREHENSIVE METABOLIC PANEL - Abnormal; Notable for the following components:      Result Value   Glucose, Bld 163 (*)    Calcium 8.1 (*)    Total Protein 6.3 (*)    Albumin 1.8 (*)    Alkaline Phosphatase 145 (*)    All other components within normal limits  CBC WITH DIFFERENTIAL/PLATELET - Abnormal; Notable for the following components:   RBC 3.05 (*)    Hemoglobin 9.7 (*)    HCT 31.5 (*)    MCV 103.3 (*)    RDW 17.1 (*)    All other components within normal limits  BRAIN NATRIURETIC PEPTIDE - Abnormal; Notable for the following components:   B Natriuretic Peptide 1,843.9 (*)    All other components within normal limits  RESPIRATORY PANEL BY RT PCR (FLU A&B, COVID)  PROTIME-INR  URINALYSIS, ROUTINE W REFLEX MICROSCOPIC  TYPE AND SCREEN  ABO/RH  TROPONIN I (HIGH SENSITIVITY)    EKG None  Radiology CT CHEST W CONTRAST  Result Date: 06/19/2019 CLINICAL DATA:  Abnormal x-ray. EXAM: CT CHEST WITH  CONTRAST TECHNIQUE: Multidetector CT imaging of the chest was performed during intravenous contrast administration. CONTRAST:  25mL OMNIPAQUE IOHEXOL 300 MG/ML  SOLN COMPARISON:  None. FINDINGS: Cardiovascular: Heart size is enlarged without pericardial effusion. Aortic caliber is normal. Left vertebral arising directly from the aortic arch. Central pulmonary arteries are engorged. Main pulmonary artery measuring approximately 2.9 cm. Not well evaluated given bolus timing. Mediastinum/Nodes: No adenopathy in the mediastinum or hilar areas with no axillary lymphadenopathy. Lungs/Pleura: Large bilateral pleural effusions. Spiculated nodule in the right upper lobe measures 12 by 11 mm (image 30, series 7) there is complete collapse of the left lower lobe and partial collapse of the lingula in the left chest. Partial collapse of the right lower lobe due to bilateral pleural effusions. Variable enhancement of collapsed lung at the lung bases also raising the question pneumonia with fluid in distal bronchi. Large airways are patent. Upper Abdomen: Incidental imaging of upper abdominal contents is unremarkable. Musculoskeletal: Diffuse body wall edema. Ruptured left breast implant with peripheral calcification. No chest wall mass. Signs of cervical spinal fusion with anterolisthesis of C7 on T1 and extensive degenerative changes at the cervicothoracic junction. No destructive bone finding. IMPRESSION: 1. Large bilateral pleural effusions with complete collapse of the left lower lobe and partial collapse of the lingula in the left chest. No current access to prior imaging which would allow for comparison. Currently in the process of attempting to retrieve prior imaging studies. 2. Variable enhancement of collapsed lung at the lung bases also raising the question of pneumonia with fluid in distal bronchi. This raises the question of current or prior pneumonia.  3. Spiculated nodule in the right upper lobe measures 12 x 11 mm.  This is suspicious for malignancy. Though by report the patient has a history of a nodule which may have been improving. 4. Anasarca with body wall edema in addition to effusions. These results will be called to the ordering clinician or representative by the Radiologist Assistant, and communication documented in the PACS or [employer]. Electronically Signed   By: Zetta Bills M.D.   On: 06/19/2019 17:12    Procedures Procedures (including critical care time)  Medications Ordered in ED Medications - No data to display  ED Course  I have reviewed the triage vital signs and the nursing notes.  Pertinent labs & imaging results that were available during my care of the patient were reviewed by me and considered in my medical decision making (see chart for details).    MDM Rules/Calculators/A&P                      MDM  Screen complete  Samantha Keller was evaluated in Emergency Department on 06/19/2019 for the symptoms described in the history of present illness. She was evaluated in the context of the global COVID-19 pandemic, which necessitated consideration that the patient might be at risk for infection with the SARS-CoV-2 virus that causes COVID-19. Institutional protocols and algorithms that pertain to the evaluation of patients at risk for COVID-19 are in a state of rapid change based on information released by regulatory bodies including the CDC and federal and state organizations. These policies and algorithms were followed during the patient's care in the ED.  Patient is presenting for evaluation of pleural effusions detected on outpatient CT.  Require admission for further work-up and treatment of these pleural effusions.   Hospitalist services were case and will evaluate for admission.  ID is aware of patient's arrival at [facility] and will consult.     Final Clinical Impression(s) / ED Diagnoses Final diagnoses:  Pleural effusion, bilateral  Dyspnea, unspecified  type    Rx / DC Orders ED Discharge Orders    None       Valarie Merino, MD 06/19/19 2252

## 2019-06-20 ENCOUNTER — Observation Stay (HOSPITAL_COMMUNITY): Payer: Medicare PPO

## 2019-06-20 ENCOUNTER — Other Ambulatory Visit: Payer: Self-pay

## 2019-06-20 DIAGNOSIS — I340 Nonrheumatic mitral (valve) insufficiency: Secondary | ICD-10-CM | POA: Diagnosis not present

## 2019-06-20 DIAGNOSIS — E871 Hypo-osmolality and hyponatremia: Secondary | ICD-10-CM | POA: Diagnosis present

## 2019-06-20 DIAGNOSIS — M069 Rheumatoid arthritis, unspecified: Secondary | ICD-10-CM | POA: Diagnosis present

## 2019-06-20 DIAGNOSIS — I361 Nonrheumatic tricuspid (valve) insufficiency: Secondary | ICD-10-CM | POA: Diagnosis not present

## 2019-06-20 DIAGNOSIS — R0600 Dyspnea, unspecified: Secondary | ICD-10-CM | POA: Diagnosis not present

## 2019-06-20 DIAGNOSIS — Z6829 Body mass index (BMI) 29.0-29.9, adult: Secondary | ICD-10-CM

## 2019-06-20 DIAGNOSIS — G629 Polyneuropathy, unspecified: Secondary | ICD-10-CM | POA: Diagnosis present

## 2019-06-20 DIAGNOSIS — R601 Generalized edema: Secondary | ICD-10-CM | POA: Diagnosis not present

## 2019-06-20 DIAGNOSIS — Z9889 Other specified postprocedural states: Secondary | ICD-10-CM | POA: Diagnosis not present

## 2019-06-20 DIAGNOSIS — I5021 Acute systolic (congestive) heart failure: Secondary | ICD-10-CM | POA: Diagnosis present

## 2019-06-20 DIAGNOSIS — R946 Abnormal results of thyroid function studies: Secondary | ICD-10-CM | POA: Diagnosis present

## 2019-06-20 DIAGNOSIS — B9561 Methicillin susceptible Staphylococcus aureus infection as the cause of diseases classified elsewhere: Secondary | ICD-10-CM | POA: Diagnosis present

## 2019-06-20 DIAGNOSIS — F329 Major depressive disorder, single episode, unspecified: Secondary | ICD-10-CM | POA: Diagnosis present

## 2019-06-20 DIAGNOSIS — K219 Gastro-esophageal reflux disease without esophagitis: Secondary | ICD-10-CM | POA: Diagnosis present

## 2019-06-20 DIAGNOSIS — J9819 Other pulmonary collapse: Secondary | ICD-10-CM | POA: Diagnosis present

## 2019-06-20 DIAGNOSIS — E874 Mixed disorder of acid-base balance: Secondary | ICD-10-CM | POA: Diagnosis not present

## 2019-06-20 DIAGNOSIS — Z95828 Presence of other vascular implants and grafts: Secondary | ICD-10-CM

## 2019-06-20 DIAGNOSIS — J9611 Chronic respiratory failure with hypoxia: Secondary | ICD-10-CM | POA: Diagnosis present

## 2019-06-20 DIAGNOSIS — N049 Nephrotic syndrome with unspecified morphologic changes: Secondary | ICD-10-CM | POA: Diagnosis present

## 2019-06-20 DIAGNOSIS — L02415 Cutaneous abscess of right lower limb: Secondary | ICD-10-CM | POA: Diagnosis present

## 2019-06-20 DIAGNOSIS — E440 Moderate protein-calorie malnutrition: Secondary | ICD-10-CM | POA: Diagnosis present

## 2019-06-20 DIAGNOSIS — R911 Solitary pulmonary nodule: Secondary | ICD-10-CM | POA: Diagnosis present

## 2019-06-20 DIAGNOSIS — I428 Other cardiomyopathies: Secondary | ICD-10-CM | POA: Diagnosis present

## 2019-06-20 DIAGNOSIS — J15211 Pneumonia due to Methicillin susceptible Staphylococcus aureus: Secondary | ICD-10-CM | POA: Diagnosis not present

## 2019-06-20 DIAGNOSIS — Z91013 Allergy to seafood: Secondary | ICD-10-CM

## 2019-06-20 DIAGNOSIS — Z6824 Body mass index (BMI) 24.0-24.9, adult: Secondary | ICD-10-CM | POA: Diagnosis not present

## 2019-06-20 DIAGNOSIS — M7989 Other specified soft tissue disorders: Secondary | ICD-10-CM | POA: Diagnosis not present

## 2019-06-20 DIAGNOSIS — L03116 Cellulitis of left lower limb: Secondary | ICD-10-CM | POA: Diagnosis present

## 2019-06-20 DIAGNOSIS — D649 Anemia, unspecified: Secondary | ICD-10-CM | POA: Diagnosis not present

## 2019-06-20 DIAGNOSIS — K5900 Constipation, unspecified: Secondary | ICD-10-CM

## 2019-06-20 DIAGNOSIS — L0291 Cutaneous abscess, unspecified: Secondary | ICD-10-CM | POA: Diagnosis not present

## 2019-06-20 DIAGNOSIS — J90 Pleural effusion, not elsewhere classified: Secondary | ICD-10-CM | POA: Diagnosis present

## 2019-06-20 DIAGNOSIS — I110 Hypertensive heart disease with heart failure: Secondary | ICD-10-CM | POA: Diagnosis present

## 2019-06-20 DIAGNOSIS — R7881 Bacteremia: Secondary | ICD-10-CM | POA: Diagnosis present

## 2019-06-20 DIAGNOSIS — E441 Mild protein-calorie malnutrition: Secondary | ICD-10-CM

## 2019-06-20 DIAGNOSIS — Z91041 Radiographic dye allergy status: Secondary | ICD-10-CM

## 2019-06-20 DIAGNOSIS — Z20822 Contact with and (suspected) exposure to covid-19: Secondary | ICD-10-CM | POA: Diagnosis present

## 2019-06-20 DIAGNOSIS — Z87891 Personal history of nicotine dependence: Secondary | ICD-10-CM | POA: Diagnosis not present

## 2019-06-20 DIAGNOSIS — Z8619 Personal history of other infectious and parasitic diseases: Secondary | ICD-10-CM

## 2019-06-20 DIAGNOSIS — I509 Heart failure, unspecified: Secondary | ICD-10-CM | POA: Diagnosis not present

## 2019-06-20 DIAGNOSIS — J45909 Unspecified asthma, uncomplicated: Secondary | ICD-10-CM | POA: Diagnosis present

## 2019-06-20 HISTORY — PX: IR THORACENTESIS ASP PLEURAL SPACE W/IMG GUIDE: IMG5380

## 2019-06-20 LAB — BODY FLUID CELL COUNT WITH DIFFERENTIAL
Lymphs, Fluid: 75 %
Monocyte-Macrophage-Serous Fluid: 18 % — ABNORMAL LOW (ref 50–90)
Neutrophil Count, Fluid: 7 % (ref 0–25)
Total Nucleated Cell Count, Fluid: 101 cu mm (ref 0–1000)

## 2019-06-20 LAB — CBC WITH DIFFERENTIAL/PLATELET
Abs Immature Granulocytes: 0.06 10*3/uL (ref 0.00–0.07)
Basophils Absolute: 0 10*3/uL (ref 0.0–0.1)
Basophils Relative: 0 %
Eosinophils Absolute: 0 10*3/uL (ref 0.0–0.5)
Eosinophils Relative: 0 %
HCT: 28.4 % — ABNORMAL LOW (ref 36.0–46.0)
Hemoglobin: 8.7 g/dL — ABNORMAL LOW (ref 12.0–15.0)
Immature Granulocytes: 1 %
Lymphocytes Relative: 33 %
Lymphs Abs: 3.3 10*3/uL (ref 0.7–4.0)
MCH: 31.4 pg (ref 26.0–34.0)
MCHC: 30.6 g/dL (ref 30.0–36.0)
MCV: 102.5 fL — ABNORMAL HIGH (ref 80.0–100.0)
Monocytes Absolute: 0.8 10*3/uL (ref 0.1–1.0)
Monocytes Relative: 8 %
Neutro Abs: 6 10*3/uL (ref 1.7–7.7)
Neutrophils Relative %: 58 %
Platelets: 340 10*3/uL (ref 150–400)
RBC: 2.77 MIL/uL — ABNORMAL LOW (ref 3.87–5.11)
RDW: 17.1 % — ABNORMAL HIGH (ref 11.5–15.5)
WBC: 10.1 10*3/uL (ref 4.0–10.5)
nRBC: 0 % (ref 0.0–0.2)

## 2019-06-20 LAB — COMPREHENSIVE METABOLIC PANEL
ALT: <5 U/L (ref 0–44)
AST: 21 U/L (ref 15–41)
Albumin: 1.8 g/dL — ABNORMAL LOW (ref 3.5–5.0)
Alkaline Phosphatase: 131 U/L — ABNORMAL HIGH (ref 38–126)
Anion gap: 9 (ref 5–15)
BUN: 13 mg/dL (ref 8–23)
CO2: 29 mmol/L (ref 22–32)
Calcium: 8.3 mg/dL — ABNORMAL LOW (ref 8.9–10.3)
Chloride: 100 mmol/L (ref 98–111)
Creatinine, Ser: 0.77 mg/dL (ref 0.44–1.00)
GFR calc Af Amer: >60 mL/min (ref >60)
GFR calc non Af Amer: >60 mL/min (ref >60)
Glucose, Bld: 135 mg/dL — ABNORMAL HIGH (ref 70–99)
Potassium: 3.6 mmol/L (ref 3.5–5.1)
Sodium: 138 mmol/L (ref 135–145)
Total Bilirubin: 0.6 mg/dL (ref 0.3–1.2)
Total Protein: 6 g/dL — ABNORMAL LOW (ref 6.5–8.1)

## 2019-06-20 LAB — GRAM STAIN

## 2019-06-20 LAB — LACTATE DEHYDROGENASE, PLEURAL OR PERITONEAL FLUID: LD, Fluid: 47 U/L — ABNORMAL HIGH (ref 3–23)

## 2019-06-20 LAB — ECHOCARDIOGRAM COMPLETE
Height: 62 in
Weight: 2567.92 oz

## 2019-06-20 LAB — LACTATE DEHYDROGENASE: LDH: 177 U/L (ref 98–192)

## 2019-06-20 LAB — PREALBUMIN: Prealbumin: 9.8 mg/dL — ABNORMAL LOW (ref 18–38)

## 2019-06-20 LAB — PROCALCITONIN: Procalcitonin: <0.1 ng/mL

## 2019-06-20 LAB — MRSA PCR SCREENING: MRSA by PCR: NEGATIVE

## 2019-06-20 MED ORDER — LIDOCAINE HCL 1 % IJ SOLN
INTRAMUSCULAR | Status: DC | PRN
  Administered 2019-06-20: 10 mL

## 2019-06-20 MED ORDER — ENSURE ENLIVE PO LIQD
237.0000 mL | Freq: Two times a day (BID) | ORAL | Status: DC
  Administered 2019-06-20 – 2019-06-30 (×6): 237 mL via ORAL

## 2019-06-20 MED ORDER — CHLORHEXIDINE GLUCONATE CLOTH 2 % EX PADS
6.0000 | MEDICATED_PAD | Freq: Every day | CUTANEOUS | Status: DC
  Administered 2019-06-20 – 2019-07-01 (×11): 6 via TOPICAL

## 2019-06-20 MED ORDER — FUROSEMIDE 10 MG/ML IJ SOLN
40.0000 mg | Freq: Once | INTRAMUSCULAR | Status: AC
  Administered 2019-06-20: 40 mg via INTRAVENOUS
  Filled 2019-06-20: qty 4

## 2019-06-20 MED ORDER — LIDOCAINE HCL 1 % IJ SOLN
INTRAMUSCULAR | Status: AC
  Filled 2019-06-20: qty 20

## 2019-06-20 MED ORDER — ALBUMIN HUMAN 25 % IV SOLN
25.0000 g | Freq: Once | INTRAVENOUS | Status: AC
  Administered 2019-06-20: 16:00:00 25 g via INTRAVENOUS
  Filled 2019-06-20 (×2): qty 100

## 2019-06-20 MED ORDER — ADULT MULTIVITAMIN W/MINERALS CH
1.0000 | ORAL_TABLET | Freq: Every day | ORAL | Status: DC
  Administered 2019-06-20 – 2019-07-01 (×12): 1 via ORAL
  Filled 2019-06-20 (×12): qty 1

## 2019-06-20 NOTE — Progress Notes (Signed)
Admitted from [facility] ED accompanied by RN alert and oriented to Home 12.Oriented to room and staff,V/S checked,attached to continous cardiac monitoring,CCMD notified.will continue to monitor the patient.

## 2019-06-20 NOTE — Progress Notes (Signed)
  Echocardiogram 2D Echocardiogram has been performed.  Jennette Dubin 06/20/2019, 1:35 PM

## 2019-06-20 NOTE — Procedures (Signed)
PROCEDURE SUMMARY:  Successful US guided left thoracentesis. Yielded 650 mL of clear yellow fluid. Patient tolerated procedure well. No immediate complications. EBL = trace  Specimen was sent for labs.  Post procedure chest X-ray reveals no pneumothorax.  Recommend Right thoracentesis tomorrow.  Maddex Garlitz S Nihal Marzella PA-C 06/20/2019 10:11 AM

## 2019-06-20 NOTE — Progress Notes (Signed)
PROGRESS NOTE    Samantha Keller  VEL:[id number] DOB: May 18, 1945 DOA: 06/19/2019 PCP: Patient, [name]   Brief Narrative:  HPI per Dr. Christia Reading Opyd 06/19/19 Samantha Keller is a 74 y.o. female with medical history significant for rheumatoid arthritis previously on Humira, chronic pain, and complicated hospitalization in [place] beginning in late January and involving MSSA bacteremia, now presenting to the ED for evaluation of concerning CT chest findings.  Patient had CT chest performed today with large bilateral pleural effusions, complete collapse of left lower lobe, partial collapse of the lingula, and spiculated nodule in the right upper lobe.  She was directed to the ED for evaluation of this.  Patient reports chronic dyspnea and cough but has not appreciated any acute change and denies any recent fevers, chills, chest pain, or palpitations.  Patient had a complicated hospitalization beginning in late January when she presented with fevers, confusion, and hypoxia, and was found to be hyponatremic with AKI, right upper lobe mass concerning for pneumonia versus cancer, MSSA bacteremia, lower extremity cellulitis with abscess, and upper GI bleed.  She had TEE negative for vegetation, had vegetation at the tip of her central line, underwent I&D abscess, and was eventually discharged to a local SNF with PICC for long course of cefazolin.  She has since established with local GI, pulmonology, and ID, and had an outpatient CT chest performed today with findings that prompted her current presentation.  ED Course: Upon arrival to the ED, patient is found to be afebrile, saturating 100% on 2 L/min of supplemental oxygen, slightly tachycardic, and with stable blood pressure.  EKG features sinus rhythm with LVH, secondary repolarization abnormality, and QTc interval 501 ms.  Chemistry panel notable for albumin of 1.8.  CBC with hemoglobin of 9.7 and MCV 103.3.  BNP is elevated to 1844.   Troponin and INR are normal.  Infectious disease was consulted by the ED physician, influenza and Covid PCR are in process, and hospitalist asked to admit.  **Interim History  Patient continues to be significantly volume overloaded and states that her shortness of breath is little bit better.  We will further work-up her anasarca and given that she had an elevated BNP will order echocardiogram and lower extremity venous duplex to rule out DVT.  Assessment & Plan:   Principal Problem:   Large pleural effusion Active Problems:   Bacteremia due to methicillin susceptible Staphylococcus aureus (MSSA)   History of GI bleed   Rheumatoid arthritis (HCC)   Anemia   Nodule of right lung   Anasarca   Protein-calorie malnutrition, mild (HCC)   Pleural effusion  Large pleural effusions; right lung nodule; chronic hypoxic respiratory failure   - Patient has been on cefazolin at her SNF for MSSA bacteremia secondary to LE cellulitis vs PNA and had follow-up chest CT today,  -Was found to have large bilateral pleural effusions with complete LLL collapse, partial collapse of lingula, and spiculated RUL nodule  - She is stable on her usual 2 Lpm and denies any recent change in chronic cough and dyspnea  - Consult IR for thoracentesis with fluid analysis including cytology  -Monocyte-macrocytosis fluid was 18 and straw-colored fluid movements 11 650 mL was removed  -Repeat chest x-ray in a.m. -Echocardiogram to rule out Cardiac Causes  -give albumin and Lasix combination x1 today -Strict I's and O's and daily weights  -BP was elevated on admission at 1843 and rule out CHF component -Repeat chest x-ray in a.m.  MSSA bacteremia   - Admitted to a hospital in [place] in late January with MSSA bacteremia, suspected PNA, and LE cellulitis with abscess  - She had TEE without vegetation; there was vegetation at tip of central line  - She has been on cefazolin with end date 06/25/2019; she is also on  doxycycline  - ID consulted by ED physician and much appreciate  - No fever or leukocytosis on admission  - Continue cefazolin and doxy, repeat blood cultures  and showed no growth to date less than 12 hours -infectious disease recommends continuing current treatment -She underwent a thoracentesis today and will follow up -Repeat chest x-ray in the a.m.   Anemia; recent UGIB  - Hgb is 9.7 in ED, improved from earlier this month and is now 8.7/28.4 today  - She is established with local GI and planned for EGD in April  - Continue Protonix  -Need to monitor for signs and symptoms of bleeding; currently no overt bleeding noted -Repeat CBC in a.m.   Rheumatoid arthritis; chronic pain  - Continue Cymbalta, gabapentin, as-needed baclofen    Anasarca and volume overload -In the setting of poor nutritional status and low albumin 1.8 and prealbumin 9.8 -Consult nutritionist and they recommended a speech eval because the patient was on pured diet; SLP has been called -Give albumin 25 g as well as IV Lasix 40 mg and see volume response -Check echocardiogram and lower extremity venous duplex to rule out DVT -BNP was elevated -Continue with Ensure Enlive p.o. twice daily as well as a multivitamin daily -Unlikely the patient's anasarca is related to nephrotic syndrome given the lack of proteinuria on her urinalysis -Check LE Venous Duplex to r/o DVT  DVT prophylaxis: Enoxaparin 40 mg sq q24h Code Status: FULL CODE  Family Communication: Discussed with Daughter at bedside  Disposition Plan: Pending improvement in Volume Status and likely will go back to SNF  Consultants:   Infectious Diseases    Procedures:  ECHOCARDIOGRAM   LE VENOUS DUPLEX  Antimicrobials:  Anti-infectives (From admission, onward)   Start     Dose/Rate Route Frequency Ordered Stop   06/19/19 2315  doxycycline (VIBRA-TABS) tablet 100 mg     100 mg Oral 2 times daily 06/19/19 2304     06/19/19 2315  ceFAZolin  (ANCEF) IVPB 2g/100 mL premix     2 g 200 mL/hr over 30 Minutes Intravenous Every 8 hours 06/19/19 2306       Subjective: Seen and examined at bedside states that she is doing okay.  No nausea or vomiting.  Thinks that her shortness breath is little bit better after getting the thoracentesis.  Remains significantly volume overloaded.  Cannot remember if she has had an echocardiogram will extremity venous duplex at [facility] and will try and obtain records but in the interim we will obtain the studies here.  Answered all the patient's daughter's questions at the bedside.  Objective: Vitals:   06/20/19 0436 06/20/19 1052 06/20/19 1055 06/20/19 1548  BP: 118/67 112/63  110/70  Pulse: 93 90 89 94  Resp: 12 16  (!) 21  Temp: 97.9 F (36.6 C)   98 F (36.7 C)  TempSrc: Oral   Oral  SpO2: 100% 100%  100%  Weight:      Height:        Intake/Output Summary (Last 24 hours) at 06/20/2019 2033 Last data filed at 06/20/2019 1548 Gross per 24 hour  Intake 313 ml  Output 580 ml  Net -267  ml   Filed Weights   06/20/19 0150  Weight: 72.8 kg   Examination: Physical Exam:  Constitutional: WN/WD overweight Caucasian female currently in NAD and appears calm Eyes: Lids and conjunctivae normal, sclerae anicteric  ENMT: External Ears, Nose appear normal. Grossly normal hearing. Neck: Appears normal, supple, no cervical masses, normal ROM, no appreciable thyromegaly; has some JVD Respiratory: Diminished to auscultation bilaterally coarse breath sounds and some crackles, no appreciable wheezing and she is on normal respiratory effort on 2 L supplemental oxygen via nasal cannula Cardiovascular: RRR, no murmurs / rubs / gallops. S1 and S2 auscultated.  Has significant to plus to 3+ lower extremity edema Abdomen: Soft, non-tender, distended secondary body habitus. Bowel sounds positive.  GU: Deferred. Musculoskeletal: No clubbing / cyanosis of digits/nails. No joint deformity upper and lower  extremities.  Skin: No rashes, lesions, ulcers on a limited skin evaluation. No induration; Warm and dry.  Neurologic: CN 2-12 grossly intact with no focal deficits. Romberg sign cerebellar reflexes not assessed.  Psychiatric: Normal judgment and insight. Alert and oriented x 3. Normal mood and appropriate affect.   Data Reviewed: I have personally reviewed following labs and imaging studies  CBC: Recent Labs  Lab 06/19/19 1935 06/20/19 0513  WBC 6.3 10.1  NEUTROABS 3.8 6.0  HGB 9.7* 8.7*  HCT 31.5* 28.4*  MCV 103.3* 102.5*  PLT 360 734   Basic Metabolic Panel: Recent Labs  Lab 06/19/19 1935 06/20/19 0513  NA 136 138  K 3.8 3.6  CL 99 100  CO2 27 29  GLUCOSE 163* 135*  BUN 10 13  CREATININE 0.68 0.77  CALCIUM 8.1* 8.3*   GFR: Estimated Creatinine Clearance: 59.4 mL/min (by C-G formula based on SCr of 0.77 mg/dL). Liver Function Tests: Recent Labs  Lab 06/19/19 1935 06/20/19 0513  AST 25 21  ALT 6 <5  ALKPHOS 145* 131*  BILITOT 0.8 0.6  PROT 6.3* 6.0*  ALBUMIN 1.8* 1.8*   No results for input(s): LIPASE, AMYLASE in the last 168 hours. No results for input(s): AMMONIA in the last 168 hours. Coagulation Profile: Recent Labs  Lab 06/19/19 1935  INR 1.1   Cardiac Enzymes: No results for input(s): CKTOTAL, CKMB, CKMBINDEX, TROPONINI in the last 168 hours. BNP (last 3 results) No results for input(s): PROBNP in the last 8760 hours. HbA1C: No results for input(s): HGBA1C in the last 72 hours. CBG: No results for input(s): GLUCAP in the last 168 hours. Lipid Profile: No results for input(s): CHOL, HDL, LDLCALC, TRIG, CHOLHDL, LDLDIRECT in the last 72 hours. Thyroid Function Tests: No results for input(s): TSH, T4TOTAL, FREET4, T3FREE, THYROIDAB in the last 72 hours. Anemia Panel: No results for input(s): VITAMINB12, FOLATE, FERRITIN, TIBC, IRON, RETICCTPCT in the last 72 hours. Sepsis Labs: Recent Labs  Lab 06/20/19 0513  PROCALCITON <0.10    Recent  Results (from the past 240 hour(s))  Respiratory Panel by RT PCR (Flu A&B, Covid) - Nasopharyngeal Swab     Status: None   Collection Time: 06/19/19  8:24 PM   Specimen: Nasopharyngeal Swab  Result Value Ref Range Status   SARS Coronavirus 2 by RT PCR NEGATIVE NEGATIVE Final    Comment: (NOTE) SARS-CoV-2 target nucleic acids are NOT DETECTED. The SARS-CoV-2 RNA is generally detectable in upper respiratoy specimens during the acute phase of infection. The lowest concentration of SARS-CoV-2 viral copies this assay can detect is 131 copies/mL. A negative result does not preclude SARS-Cov-2 infection and should not be used as the sole basis  for treatment or other patient management decisions. A negative result may occur with  improper specimen collection/handling, submission of specimen other than nasopharyngeal swab, presence of viral mutation(s) within the areas targeted by this assay, and inadequate number of viral copies (<131 copies/mL). A negative result must be combined with clinical observations, patient history, and epidemiological information. The expected result is Negative. Fact Sheet for Patients:  PinkCheek.be Fact Sheet for Healthcare Providers:  GravelBags.it This test is not yet ap proved or cleared by the [place] FDA and  has been authorized for detection and/or diagnosis of SARS-CoV-2 by FDA under an Emergency Use Authorization (EUA). This EUA will remain  in effect (meaning this test can be used) for the duration of the COVID-19 declaration under Section 564(b)(1) of the Act, 21 U.S.C. section 360bbb-3(b)(1), unless the authorization is terminated or revoked sooner.    Influenza A by PCR NEGATIVE NEGATIVE Final   Influenza B by PCR NEGATIVE NEGATIVE Final    Comment: (NOTE) The Xpert Xpress SARS-CoV-2/FLU/RSV assay is intended as an aid in  the diagnosis of influenza from Nasopharyngeal swab specimens  and  should not be used as a sole basis for treatment. Nasal washings and  aspirates are unacceptable for Xpert Xpress SARS-CoV-2/FLU/RSV  testing. Fact Sheet for Patients: PinkCheek.be Fact Sheet for Healthcare Providers: GravelBags.it This test is not yet approved or cleared by the [place] FDA and  has been authorized for detection and/or diagnosis of SARS-CoV-2 by  FDA under an Emergency Use Authorization (EUA). This EUA will remain  in effect (meaning this test can be used) for the duration of the  Covid-19 declaration under Section 564(b)(1) of the Act, 21  U.S.C. section 360bbb-3(b)(1), unless the authorization is  terminated or revoked. Performed at [facility], [place] [address] [place], [place] [address]   Culture, blood (routine x 2)     Status: None (Preliminary result)   Collection Time: 06/19/19 11:25 PM   Specimen: BLOOD  Result Value Ref Range Status   Specimen Description BLOOD RIGHT ARM  Final   Special Requests   Final    BOTTLES DRAWN AEROBIC AND ANAEROBIC Blood Culture adequate volume   Culture   Final    NO GROWTH < 12 HOURS Performed at [facility], [place] [address], [place], [place] [address]    Report Status PENDING  Incomplete  Culture, blood (routine x 2)     Status: None (Preliminary result)   Collection Time: 06/19/19 11:30 PM   Specimen: BLOOD  Result Value Ref Range Status   Specimen Description BLOOD LEFT HAND  Final   Special Requests   Final    BOTTLES DRAWN AEROBIC AND ANAEROBIC Blood Culture adequate volume   Culture   Final    NO GROWTH < 12 HOURS Performed at [facility], [place] [address] [place], [place] [address]    Report Status PENDING  Incomplete  MRSA PCR Screening     Status: None   Collection Time: 06/20/19  8:59 AM   Specimen: Nasal Mucosa; Nasopharyngeal  Result Value Ref Range Status   MRSA by PCR NEGATIVE NEGATIVE Final    Comment:        The  GeneXpert MRSA Assay (FDA approved for NASAL specimens only), is one component of a comprehensive MRSA colonization surveillance program. It is not intended to diagnose MRSA infection nor to guide or monitor treatment for MRSA infections. Performed at [facility], [place],  [place] [address]   Gram stain     Status: None   Collection Time: 06/20/19 10:56 AM   Specimen: PATH Cytology Pleural fluid  Result Value Ref Range Status   Specimen Description FLUID  Final   Special Requests NONE  Final   Gram Stain   Final    CYTOSPIN SMEAR WBC PRESENT, PREDOMINANTLY MONONUCLEAR NO ORGANISMS SEEN Performed at [facility], [address], [place], [place] [address]    Report Status 06/20/2019 FINAL  Final     RN Pressure Injury Documentation:     Estimated body mass index is 29.35 kg/m as calculated from the following:   Height as of this encounter: 5\' 2"  (1.575 m).   Weight as of this encounter: 72.8 kg.  Malnutrition Type:  Nutrition Problem: Inadequate oral intake Etiology: decreased appetite   Malnutrition Characteristics:  Signs/Symptoms: per patient/family report   Nutrition Interventions:  Interventions: Ensure Enlive (each supplement provides 350kcal and 20 grams of protein), MVI   Radiology Studies: DG Chest 1 View  Result Date: 06/20/2019 CLINICAL DATA:  Prior thoracentesis. EXAM: CHEST  1 VIEW COMPARISON:  CT 02/19/2020. FINDINGS: PICC line noted with tip over cavoatrial junction. Heart size normal. Interim decrease in left-sided pleural effusion following thoracentesis. No pneumothorax. Persistent right pleural effusion. Persistent bibasilar atelectasis. Prior cervical spine fusion. IMPRESSION: 1.  Right PICC line noted with tip over cavoatrial junction. 2. Near complete resolution of left-sided pleural effusion following thoracentesis. No evidence of pneumothorax. 3. Persistent right pleural effusion. Persistent bibasilar atelectasis.  Electronically Signed   By: Marcello Moores  Register   On: 06/20/2019 09:59   CT CHEST W CONTRAST  Result Date: 06/19/2019 CLINICAL DATA:  Abnormal x-ray. EXAM: CT CHEST WITH CONTRAST TECHNIQUE: Multidetector CT imaging of the chest was performed during intravenous contrast administration. CONTRAST:  21mL OMNIPAQUE IOHEXOL 300 MG/ML  SOLN COMPARISON:  None. FINDINGS: Cardiovascular: Heart size is enlarged without pericardial effusion. Aortic caliber is normal. Left vertebral arising directly from the aortic arch. Central pulmonary arteries are engorged. Main pulmonary artery measuring approximately 2.9 cm. Not well evaluated given bolus timing. Mediastinum/Nodes: No adenopathy in the mediastinum or hilar areas with no axillary lymphadenopathy. Lungs/Pleura: Large bilateral pleural effusions. Spiculated nodule in the right upper lobe measures 12 by 11 mm (image 30, series 7) there is complete collapse of the left lower lobe and partial collapse of the lingula in the left chest. Partial collapse of the right lower lobe due to bilateral pleural effusions. Variable enhancement of collapsed lung at the lung bases also raising the question pneumonia with fluid in distal bronchi. Large airways are patent. Upper Abdomen: Incidental imaging of upper abdominal contents is unremarkable. Musculoskeletal: Diffuse body wall edema. Ruptured left breast implant with peripheral calcification. No chest wall mass. Signs of cervical spinal fusion with anterolisthesis of C7 on T1 and extensive degenerative changes at the cervicothoracic junction. No destructive bone finding. IMPRESSION: 1. Large bilateral pleural effusions with complete collapse of the left lower lobe and partial collapse of the lingula in the left chest. No current access to prior imaging which would allow for comparison. Currently in the process of attempting to retrieve prior imaging studies. 2. Variable enhancement of collapsed lung at the lung bases also raising the  question of pneumonia with fluid in distal bronchi. This raises the question of current or prior pneumonia. 3. Spiculated nodule in the right upper lobe measures 12 x 11 mm. This is suspicious for malignancy. Though by report the patient has a  history of a nodule which may have been improving. 4. Anasarca with body wall edema in addition to effusions. These results will be called to the ordering clinician or representative by the Radiologist Assistant, and communication documented in the PACS or [employer]. Electronically Signed   By: Zetta Bills M.D.   On: 06/19/2019 17:12   ECHOCARDIOGRAM COMPLETE  Result Date: 06/20/2019    ECHOCARDIOGRAM REPORT   Patient Name:   Samantha Keller Date of Exam: 06/20/2019 Medical Rec #:  [id number]      Height:       62.0 in Accession #:    [id number]     Weight:       160.5 lb Date of Birth:  December 13, 1945       BSA:          1.741 m Patient Age:    25 years       BP:           112/63 mmHg Patient Gender: F              HR:           90 bpm. Exam Location:  Inpatient Procedure: 2D Echo Indications:    Dyspnea R06.00  History:        Patient has no prior history of Echocardiogram examinations.  Sonographer:    Mikki Santee RDCS (AE) Referring Phys: [id number] Georgina Quint LATIF [place]  1. Left ventricular ejection fraction, by estimation, is 20 to 25%. The left ventricle has severely decreased function. The left ventricle demonstrates global hypokinesis. Indeterminate diastolic filling due to E-A fusion.  2. Right ventricular systolic function is mildly reduced. The right ventricular size is moderately enlarged. There is moderately elevated pulmonary artery systolic pressure. The estimated right ventricular systolic pressure is 09.9 mmHg.  3. Right atrial size was mild to moderately dilated.  4. The mitral valve is grossly normal. Mild to moderate mitral valve regurgitation.  5. Tricuspid valve regurgitation is moderate.  6. The aortic valve is tricuspid. Aortic valve  regurgitation is not visualized. No aortic stenosis is present.  7. The inferior vena cava is dilated in size with <50% respiratory variability, suggesting right atrial pressure of 15 mmHg. FINDINGS  Left Ventricle: Left ventricular ejection fraction, by estimation, is 20 to 25%. The left ventricle has severely decreased function. The left ventricle demonstrates global hypokinesis. The left ventricular internal cavity size was normal in size. There is no left ventricular hypertrophy. Abnormal (paradoxical) septal motion, consistent with left bundle branch block. Indeterminate diastolic filling due to E-A fusion. Right Ventricle: The right ventricular size is moderately enlarged. No increase in right ventricular wall thickness. Right ventricular systolic function is mildly reduced. There is moderately elevated pulmonary artery systolic pressure. The tricuspid regurgitant velocity is 3.08 m/s, and with an assumed right atrial pressure of 15 mmHg, the estimated right ventricular systolic pressure is 83.3 mmHg. Left Atrium: Left atrial size was normal in size. Right Atrium: Right atrial size was mild to moderately dilated. Pericardium: There is no evidence of pericardial effusion. Presence of pericardial fat pad. Mitral Valve: The mitral valve is grossly normal. Mild to moderate mitral valve regurgitation. Tricuspid Valve: The tricuspid valve is grossly normal. Tricuspid valve regurgitation is moderate. Aortic Valve: The aortic valve is tricuspid. Aortic valve regurgitation is not visualized. No aortic stenosis is present. Pulmonic Valve: The pulmonic valve was grossly normal. Pulmonic valve regurgitation is not visualized. No evidence of pulmonic stenosis. Aorta: The aortic root and ascending  aorta are structurally normal, with no evidence of dilitation. Venous: The inferior vena cava is dilated in size with less than 50% respiratory variability, suggesting right atrial pressure of 15 mmHg. IAS/Shunts: The atrial  septum is grossly normal. Additional Comments: A venous catheter is visualized in the right atrium. There is a small pleural effusion in the left lateral region.  LEFT VENTRICLE PLAX 2D LVIDd:         4.50 cm     Diastology LVIDs:         3.60 cm     LV e' lateral:   6.30 cm/s LV PW:         1.00 cm     LV E/e' lateral: 12.9 LV IVS:        0.80 cm     LV e' medial:    4.06 cm/s LVOT diam:     1.90 cm     LV E/e' medial:  20.0 LV SV:         31 LV SV Index:   18 LVOT Area:     2.84 cm  LV Volumes (MOD) LV vol d, MOD A2C: 79.9 ml LV vol d, MOD A4C: 72.4 ml LV vol s, MOD A2C: 51.7 ml LV vol s, MOD A4C: 45.7 ml LV SV MOD A2C:     28.2 ml LV SV MOD A4C:     72.4 ml LV SV MOD BP:      29.5 ml RIGHT VENTRICLE RV S prime:     8.70 cm/s TAPSE (M-mode): 1.2 cm LEFT ATRIUM             Index       RIGHT ATRIUM           Index LA diam:        3.50 cm 2.01 cm/m  RA Area:     16.90 cm LA Vol (A2C):   36.2 ml 20.79 ml/m RA Volume:   50.10 ml  28.78 ml/m LA Vol (A4C):   41.3 ml 23.72 ml/m LA Biplane Vol: 38.8 ml 22.29 ml/m  AORTIC VALVE LVOT Vmax:   70.00 cm/s LVOT Vmean:  44.400 cm/s LVOT VTI:    0.108 m  AORTA Ao Root diam: 2.30 cm Ao Asc diam:  3.00 cm MITRAL VALVE               TRICUSPID VALVE MV Area (PHT): 3.99 cm    TR Peak grad:   37.9 mmHg MV Decel Time: 190 msec    TR Vmax:        308.00 cm/s MR Peak grad: 33.6 mmHg MR Vmax:      290.00 cm/s  SHUNTS MV E velocity: 81.00 cm/s  Systemic VTI:  0.11 m MV A velocity: 72.20 cm/s  Systemic Diam: 1.90 cm MV E/A ratio:  1.12 Eleonore Chiquito MD Electronically signed by Eleonore Chiquito MD Signature Date/Time: 06/20/2019/4:09:00 PM    Final    IR THORACENTESIS ASP PLEURAL SPACE W/IMG GUIDE  Result Date: 06/20/2019 INDICATION: Large bilateral pleural effusions with complete left lower lobe collapse, partial collapse of lingula, and spiculated right upper lobe nodule. Request for diagnostic and therapeutic thoracentesis. EXAM: ULTRASOUND GUIDED LEFT THORACENTESIS MEDICATIONS: 1%  lidocaine 10 mL COMPLICATIONS: None immediate. PROCEDURE: An ultrasound guided thoracentesis was thoroughly discussed with the patient and questions answered. The benefits, risks, alternatives and complications were also discussed. The patient understands and wishes to proceed with the procedure. Written consent was obtained. Ultrasound was performed to localize and  mark an adequate pocket of fluid in the left chest. The area was then prepped and draped in the normal sterile fashion. 1% Lidocaine was used for local anesthesia. Under ultrasound guidance a 6 Fr Safe-T-Centesis catheter was introduced. Thoracentesis was performed. The catheter was removed and a dressing applied. FINDINGS: A total of approximately 650 mL of clear yellow fluid was removed. Samples were sent to the laboratory as requested by the clinical team. IMPRESSION: Successful ultrasound guided left thoracentesis yielding 650 mL of pleural fluid. No pneumothorax on post-procedure chest x-ray. Read by: Gareth Eagle, PA-C Electronically Signed   By: Lucrezia Europe M.D.   On: 06/20/2019 10:11   Scheduled Meds: . Chlorhexidine Gluconate Cloth  6 each Topical Daily  . cycloSPORINE  1 drop Both Eyes BID  . doxycycline  100 mg Oral BID  . DULoxetine  60 mg Oral Daily  . enoxaparin (LOVENOX) injection  40 mg Subcutaneous Q24H  . feeding supplement (ENSURE ENLIVE)  237 mL Oral BID BM  . folic acid  2 mg Oral Daily  . gabapentin  600 mg Oral TID  . lidocaine      . melatonin  6 mg Oral QHS  . metoprolol tartrate  25 mg Oral BID  . multivitamin with minerals  1 tablet Oral Daily  . pantoprazole  40 mg Oral BID  . sodium chloride flush  3 mL Intravenous Q12H  . sodium chloride flush  3 mL Intravenous Q12H   Continuous Infusions: . sodium chloride    .  ceFAZolin (ANCEF) IV 2 g (06/20/19 1359)    LOS: 0 days    Kerney Elbe, DO [facility] PAGER is on [place]  If 7PM-7AM, please contact night-coverage [email or web address]

## 2019-06-20 NOTE — Progress Notes (Signed)
Initial Nutrition Assessment  DOCUMENTATION CODES:   Not applicable  INTERVENTION:    Ensure Enlive po BID, each supplement provides 350 kcal and 20 grams of protein  MVI daily   NUTRITION DIAGNOSIS:   Inadequate oral intake related to decreased appetite as evidenced by per patient/family report.  GOAL:   Patient will meet greater than or equal to 90% of their needs  MONITOR:   PO intake, Supplement acceptance, Weight trends, Labs, I & O's  REASON FOR ASSESSMENT:   Consult Assessment of nutrition requirement/status  ASSESSMENT:   Patient with PMH significant for depression, dysphagia, essential HTN, and RA. Presents this admission with large bilateral pleural effusions and R lung nodule.   Pt endorses decreased intake over the last few months due to not liking texture of food at facility. At SNF, SLP recommended DYS3 texture food with pureed meats. Meals typically consist of meat, vegetable, and grain but pt skips meat. Pt placed on DYS 3 diet this admission. Reached out to MD for possible SLP evaluation here. Pt willing to drink Ensure this admission.   Pt reports a UBW of 115 ln and a recent fluid weight gain of 35 lbs. Records indicate pt weighed 165 lb on 3/17 and 161 lb this admission. Bilateral lower extremities show severe edema. Fluid accumulation may be masking losses.   Drips: albumin Medications: 40 mg lasix once  Labs: CBG 135-163  NUTRITION - FOCUSED PHYSICAL EXAM:    Most Recent Value  Orbital Region  Mild depletion  Upper Arm Region  Mild depletion  Thoracic and Lumbar Region  Unable to assess  Buccal Region  No depletion  Temple Region  Moderate depletion  Clavicle Bone Region  Moderate depletion  Clavicle and Acromion Bone Region  Moderate depletion  Scapular Bone Region  Unable to assess  Dorsal Hand  Severe depletion  Patellar Region  No depletion  Anterior Thigh Region  No depletion  Posterior Calf Region  No depletion  Edema (RD  Assessment)  Severe  Hair  Reviewed  Eyes  Reviewed  Mouth  Reviewed  Skin  Reviewed  Nails  Reviewed     Diet Order:   Diet Order            DIET DYS 3 Room service appropriate? Yes; Fluid consistency: Thin  Diet effective now              EDUCATION NEEDS:   Education needs have been addressed  Skin:  Skin Assessment: Reviewed RN Assessment  Last BM:  3/25  Height:   Ht Readings from Last 1 Encounters:  06/20/19 5\' 2"  (1.575 m)    Weight:   Wt Readings from Last 1 Encounters:  06/20/19 72.8 kg    BMI:  Body mass index is 29.35 kg/m.  Estimated Nutritional Needs:   Kcal:  1700-1900 kcal  Protein:  85-100 grams  Fluid:  >/= 1.7 L/day   Mariana Single RD, LDN Clinical Nutrition Pager listed in [place]

## 2019-06-20 NOTE — Progress Notes (Addendum)
Patient ID: Samantha Keller, female   DOB: [birth year], 74 y.o.   MRN: [id number]         [facility]  Date of Admission:  06/19/2019   Total days of antibiotics 59         ASSESSMENT: I think her pleural effusions are probably chronic and transudative and related to her malnutrition and anasarca.  I do not have images of her prior CT scans but there is certainly no right upper lobe cavity.  I suspect that she had MSSA pneumonia that is improving on therapy.  Assuming that the Gram stain and culture pleural fluid are negative I still plan on discontinuing her cefazolin on 06/25/2019.  I will see her on Monday if she is still in the hospital at that time.  PLAN: 1. Continue cefazolin 2. Please call Dr. Tommy Medal [phone]) for any infectious disease questions this weekend  Principal Problem:   Large pleural effusion Active Problems:   Bacteremia due to methicillin susceptible Staphylococcus aureus (MSSA)   History of GI bleed   Rheumatoid arthritis ([place])   Anemia   Nodule of right lung   Anasarca   Protein-calorie malnutrition, mild (HCC)   Scheduled Meds: . Chlorhexidine Gluconate Cloth  6 each Topical Daily  . cycloSPORINE  1 drop Both Eyes BID  . doxycycline  100 mg Oral BID  . DULoxetine  60 mg Oral Daily  . enoxaparin (LOVENOX) injection  40 mg Subcutaneous Q24H  . folic acid  2 mg Oral Daily  . furosemide  40 mg Intravenous Once  . gabapentin  600 mg Oral TID  . lidocaine      . melatonin  6 mg Oral QHS  . metoprolol tartrate  25 mg Oral BID  . pantoprazole  40 mg Oral BID  . sodium chloride flush  3 mL Intravenous Q12H  . sodium chloride flush  3 mL Intravenous Q12H   Continuous Infusions: . sodium chloride    . albumin human    .  ceFAZolin (ANCEF) IV 2 g (06/20/19 0531)   PRN Meds:.sodium chloride, acetaminophen **OR** acetaminophen, baclofen, lidocaine, polyethylene glycol, sodium chloride flush   SUBJECTIVE: Samantha Keller is a 74  y.o. female from [place], [place] with a history of MSSA bacteremia. She developed some increasing left leg pain in December and her doctor increased her dose of Humira suspecting a flare of her RA.  Samantha Keller received her first Covid vaccine in mid January.  Shortly after that her daughters became concerned that she was confused when they spoke to her over the phone.  Thet finally called police on 5/68/1275.  She was found to be confused, febrile and hypoxic and was admitted to the hospital in [place].  She had hyponatremia and acute kidney injury.  Chest x-ray and CT scan revealed a right upper lobe mass concerning for cancer versus infection. Blood cultures grew MSSA.  She was on a variety of antibiotics during the first week of her illness.  An infectious disease doctor was consulted.  Samantha Keller condition grew worse when she developed a GI bleed and required intubation.  She was treated for a right IJ central line infection.  Scans of her brain and spine were negative for infection. Scans of her lower extremities revealed left thigh cellulitis and bilateral soft tissue abscesses around her hip.  She underwent incision and drainage of the abscesses.  The abscess on her right side tracked down to her greater  trochanter.  There was concern for drug fever to ampicillin.  She was switched to IV cefazolin and had a PICC placed in her left arm.  She has now completed 59 days of total IV antibiotic therapy.    Echocardiography, including TEE, did not show any evidence of endocarditis.  A repeat chest CT scan showed enlargement and cavitation of the right upper lobe infiltrate. The final recommendation was to continue IV cefazolin for 6 weeks after discharge on 05/14/2019 given the concerns about cavitary pneumonia and the fact that her right trochanteric bursa abscess had just been drained.    Samantha Keller was transferred to [facility] skilled nursing facility on 05/14/2019. She does not have any  recall of her hospitalization. She is participating and physical therapy and is able to stand and do some walking.    She has been riding a stationary bike recently. Her appetite is much better.  Her short-term memory has suffered.  She has not had any problems tolerating her cefazolin or PICC.  When I first saw her in clinic on 05/27/2019 I ordered a follow-up chest CT scan that was done yesterday.  It showed large bilateral pleural effusions.  There was no right upper lobe cavity seen.  There was a 11 x 12 mm spiculated nodule in the right upper lobe.  She has not had any increased shortness of breath, cough or problems with hypoxia.  My partner was called and arranged admission for thoracentesis.  She underwent left thoracentesis this morning and 650 cc of clear yellow fluid was removed.  That fluid is transudate of by LDH criteria.  Fluid protein, Gram stain, culture and cytology are pending.  She is scheduled to undergo right thoracentesis tomorrow.  Review of Systems: Review of Systems  Constitutional: Positive for malaise/fatigue. Negative for chills, fever and weight loss.  Respiratory: Positive for cough. Negative for sputum production and shortness of breath.   Cardiovascular: Positive for leg swelling. Negative for chest pain.  Gastrointestinal: Positive for constipation. Negative for abdominal pain, diarrhea, nausea and vomiting.  Genitourinary: Negative for dysuria.  Musculoskeletal: Negative for back pain and joint pain.  Skin: Negative for rash.    Allergies  Allergen Reactions  . Iodinated Diagnostic Agents Anaphylaxis  . Shellfish Allergy Anaphylaxis  . Shellfish-Derived Products Anaphylaxis    OBJECTIVE: Vitals:   06/20/19 0200 06/20/19 0436 06/20/19 1052 06/20/19 1055  BP:  118/67 112/63   Pulse:  93 90 89  Resp: 20 12 16    Temp:  97.9 F (36.6 C)    TempSrc:  Oral    SpO2:  100% 100%   Weight:      Height:       Body mass index is 29.35 kg/m.  Physical  Exam Constitutional:      Comments: She looks like she is feeling much better than when I saw her several weeks ago.  She is much more alert and talkative.  Her daughter is at the bedside.  Cardiovascular:     Rate and Rhythm: Normal rate and regular rhythm.     Heart sounds: No murmur.  Pulmonary:     Effort: Pulmonary effort is normal.     Comments: Lungs are clear anteriorly. Abdominal:     Palpations: Abdomen is soft.     Tenderness: There is no abdominal tenderness.  Musculoskeletal:     Right lower leg: Edema present.     Left lower leg: Edema present.  Skin:    Comments: Right arm PICC  site looks good.      Lab Results Lab Results  Component Value Date   WBC 10.1 06/20/2019   HGB 8.7 (L) 06/20/2019   HCT 28.4 (L) 06/20/2019   MCV 102.5 (H) 06/20/2019   PLT 340 06/20/2019    Lab Results  Component Value Date   CREATININE 0.77 06/20/2019   BUN 13 06/20/2019   NA 138 06/20/2019   K 3.6 06/20/2019   CL 100 06/20/2019   CO2 29 06/20/2019    Lab Results  Component Value Date   ALT <5 06/20/2019   AST 21 06/20/2019   ALKPHOS 131 (H) 06/20/2019   BILITOT 0.6 06/20/2019     Microbiology: Recent Results (from the past 240 hour(s))  Respiratory Panel by RT PCR (Flu A&B, Covid) - Nasopharyngeal Swab     Status: None   Collection Time: 06/19/19  8:24 PM   Specimen: Nasopharyngeal Swab  Result Value Ref Range Status   SARS Coronavirus 2 by RT PCR NEGATIVE NEGATIVE Final    Comment: (NOTE) SARS-CoV-2 target nucleic acids are NOT DETECTED. The SARS-CoV-2 RNA is generally detectable in upper respiratoy specimens during the acute phase of infection. The lowest concentration of SARS-CoV-2 viral copies this assay can detect is 131 copies/mL. A negative result does not preclude SARS-Cov-2 infection and should not be used as the sole basis for treatment or other patient management decisions. A negative result may occur with  improper specimen collection/handling,  submission of specimen other than nasopharyngeal swab, presence of viral mutation(s) within the areas targeted by this assay, and inadequate number of viral copies (<131 copies/mL). A negative result must be combined with clinical observations, patient history, and epidemiological information. The expected result is Negative. Fact Sheet for Patients:  PinkCheek.be Fact Sheet for Healthcare Providers:  GravelBags.it This test is not yet ap proved or cleared by the [place] FDA and  has been authorized for detection and/or diagnosis of SARS-CoV-2 by FDA under an Emergency Use Authorization (EUA). This EUA will remain  in effect (meaning this test can be used) for the duration of the COVID-19 declaration under Section 564(b)(1) of the Act, 21 U.S.C. section 360bbb-3(b)(1), unless the authorization is terminated or revoked sooner.    Influenza A by PCR NEGATIVE NEGATIVE Final   Influenza B by PCR NEGATIVE NEGATIVE Final    Comment: (NOTE) The Xpert Xpress SARS-CoV-2/FLU/RSV assay is intended as an aid in  the diagnosis of influenza from Nasopharyngeal swab specimens and  should not be used as a sole basis for treatment. Nasal washings and  aspirates are unacceptable for Xpert Xpress SARS-CoV-2/FLU/RSV  testing. Fact Sheet for Patients: PinkCheek.be Fact Sheet for Healthcare Providers: GravelBags.it This test is not yet approved or cleared by the [place] FDA and  has been authorized for detection and/or diagnosis of SARS-CoV-2 by  FDA under an Emergency Use Authorization (EUA). This EUA will remain  in effect (meaning this test can be used) for the duration of the  Covid-19 declaration under Section 564(b)(1) of the Act, 21  U.S.C. section 360bbb-3(b)(1), unless the authorization is  terminated or revoked. Performed at [facility], [place] [address]  [place], [place] [address]   Culture, blood (routine x 2)     Status: None (Preliminary result)   Collection Time: 06/19/19 11:25 PM   Specimen: BLOOD  Result Value Ref Range Status   Specimen Description BLOOD RIGHT ARM  Final   Special Requests   Final    BOTTLES DRAWN AEROBIC  AND ANAEROBIC Blood Culture adequate volume   Culture   Final    NO GROWTH < 12 HOURS Performed at [facility] [address], [place], [place] [address]    Report Status PENDING  Incomplete  Culture, blood (routine x 2)     Status: None (Preliminary result)   Collection Time: 06/19/19 11:30 PM   Specimen: BLOOD  Result Value Ref Range Status   Specimen Description BLOOD LEFT HAND  Final   Special Requests   Final    BOTTLES DRAWN AEROBIC AND ANAEROBIC Blood Culture adequate volume   Culture   Final    NO GROWTH < 12 HOURS Performed at [facility], [place] [address] [place], [place] [address]    Report Status PENDING  Incomplete  MRSA PCR Screening     Status: None   Collection Time: 06/20/19  8:59 AM   Specimen: Nasal Mucosa; Nasopharyngeal  Result Value Ref Range Status   MRSA by PCR NEGATIVE NEGATIVE Final    Comment:        The GeneXpert MRSA Assay (FDA approved for NASAL specimens only), is one component of a comprehensive MRSA colonization surveillance program. It is not intended to diagnose MRSA infection nor to guide or monitor treatment for MRSA infections. Performed at [facility], [place] [address], [place], [place] [address]     Michel Bickers, [facility] [place] [facility] [phone] pager   [phone] 06/20/2019, 12:24 PM

## 2019-06-21 ENCOUNTER — Inpatient Hospital Stay (HOSPITAL_COMMUNITY): Payer: Medicare PPO

## 2019-06-21 DIAGNOSIS — I5021 Acute systolic (congestive) heart failure: Secondary | ICD-10-CM

## 2019-06-21 DIAGNOSIS — M7989 Other specified soft tissue disorders: Secondary | ICD-10-CM

## 2019-06-21 LAB — COMPREHENSIVE METABOLIC PANEL
ALT: <5 U/L (ref 0–44)
AST: 39 U/L (ref 15–41)
Albumin: 2.2 g/dL — ABNORMAL LOW (ref 3.5–5.0)
Alkaline Phosphatase: 154 U/L — ABNORMAL HIGH (ref 38–126)
Anion gap: 8 (ref 5–15)
BUN: 13 mg/dL (ref 8–23)
CO2: 29 mmol/L (ref 22–32)
Calcium: 8.1 mg/dL — ABNORMAL LOW (ref 8.9–10.3)
Chloride: 99 mmol/L (ref 98–111)
Creatinine, Ser: 0.73 mg/dL (ref 0.44–1.00)
GFR calc Af Amer: >60 mL/min (ref >60)
GFR calc non Af Amer: >60 mL/min (ref >60)
Glucose, Bld: 95 mg/dL (ref 70–99)
Potassium: 3.4 mmol/L — ABNORMAL LOW (ref 3.5–5.1)
Sodium: 136 mmol/L (ref 135–145)
Total Bilirubin: 0.9 mg/dL (ref 0.3–1.2)
Total Protein: 5.9 g/dL — ABNORMAL LOW (ref 6.5–8.1)

## 2019-06-21 LAB — CBC WITH DIFFERENTIAL/PLATELET
Abs Immature Granulocytes: 0.03 10*3/uL (ref 0.00–0.07)
Basophils Absolute: 0 10*3/uL (ref 0.0–0.1)
Basophils Relative: 1 %
Eosinophils Absolute: 0.3 10*3/uL (ref 0.0–0.5)
Eosinophils Relative: 4 %
HCT: 27.2 % — ABNORMAL LOW (ref 36.0–46.0)
Hemoglobin: 8.3 g/dL — ABNORMAL LOW (ref 12.0–15.0)
Immature Granulocytes: 0 %
Lymphocytes Relative: 40 %
Lymphs Abs: 3.2 10*3/uL (ref 0.7–4.0)
MCH: 32.2 pg (ref 26.0–34.0)
MCHC: 30.5 g/dL (ref 30.0–36.0)
MCV: 105.4 fL — ABNORMAL HIGH (ref 80.0–100.0)
Monocytes Absolute: 0.6 10*3/uL (ref 0.1–1.0)
Monocytes Relative: 8 %
Neutro Abs: 3.8 10*3/uL (ref 1.7–7.7)
Neutrophils Relative %: 47 %
Platelets: 311 10*3/uL (ref 150–400)
RBC: 2.58 MIL/uL — ABNORMAL LOW (ref 3.87–5.11)
RDW: 17.2 % — ABNORMAL HIGH (ref 11.5–15.5)
WBC: 8 10*3/uL (ref 4.0–10.5)
nRBC: 0 % (ref 0.0–0.2)

## 2019-06-21 LAB — MAGNESIUM: Magnesium: 1.6 mg/dL — ABNORMAL LOW (ref 1.7–2.4)

## 2019-06-21 LAB — PHOSPHORUS: Phosphorus: 3.7 mg/dL (ref 2.5–4.6)

## 2019-06-21 MED ORDER — MAGNESIUM SULFATE 2 GM/50ML IV SOLN
2.0000 g | Freq: Once | INTRAVENOUS | Status: AC
  Administered 2019-06-21: 2 g via INTRAVENOUS
  Filled 2019-06-21: qty 50

## 2019-06-21 MED ORDER — LOSARTAN POTASSIUM 25 MG PO TABS
12.5000 mg | ORAL_TABLET | Freq: Every day | ORAL | Status: DC
  Administered 2019-06-22 – 2019-06-26 (×4): 12.5 mg via ORAL
  Filled 2019-06-21 (×6): qty 1

## 2019-06-21 MED ORDER — FUROSEMIDE 10 MG/ML IJ SOLN
40.0000 mg | Freq: Two times a day (BID) | INTRAMUSCULAR | Status: DC
  Administered 2019-06-21: 11:00:00 40 mg via INTRAVENOUS
  Filled 2019-06-21: qty 4

## 2019-06-21 MED ORDER — FUROSEMIDE 10 MG/ML IJ SOLN
60.0000 mg | Freq: Two times a day (BID) | INTRAMUSCULAR | Status: DC
  Administered 2019-06-21 – 2019-06-24 (×7): 60 mg via INTRAVENOUS
  Filled 2019-06-21 (×8): qty 6

## 2019-06-21 NOTE — Consult Note (Signed)
Cardiology Consultation:   Patient ID: Samantha Keller MRN: [id number]; DOB: 1945-07-22  Admit date: 06/19/2019 Date of Consult: 06/21/2019  Primary Care Provider: Patient, [name] Primary Cardiologist: New Primary Electrophysiologist:  None    Patient Profile:   Samantha Keller is a 74 y.o. female with a hx of rheumatoid arthritis, recent extended admission in [place] with MSSA bacteremia,  who is being seen today for the evaluation of acute HF at the request of Dr . Alfredia Ferguson  History of Present Illness:   Samantha Keller 74 yo female history of RA, prior admission in [place] with MSSA bacteremia. She was admitted after a CT chest showed large bilateral pleural effusions, complete collapse of left lower lobe, partial collapse of the lingula, and spiculated nodule in the right upper lobe.  She has chronic dyspnea and cough. From H&p also history of upper GI bleed, with plans for EGD in April  This admission found to have LVEF 20-25% new finding for patient. She reports recent severe LE edema and SOb/DOE      Past Medical History:  Diagnosis Date  . Abscess of bursa of right hip   . Acute kidney failure ([place])   . Alcohol withdrawal ([place])   . Asthma   . Cellulitis of unspecified part of limb   . Chronic tension type headache   . Depression   . Dysphagia, oropharyngeal phase   . Essential (primary) hypertension   . Hyperosmolality and/or hypernatremia   . Hypovolemic shock ([place])   . Iron deficiency anemia   . LBBB (left bundle Berman Grainger block)   . Malignant neoplasm of upper lobe bronchus, left ([place])   . Metabolic encephalopathy   . MSSA bacteremia 2021  . Muscle weakness (generalized)   . Peritoneal abscess ([place])   . Pneumonia   . RA (rheumatoid arthritis) ([place])   . Respiratory failure ([place])   . Staph infection   . UGIB (upper gastrointestinal bleed) 2021  . Unspecified protein-calorie malnutrition ([place])     Past Surgical History:  Procedure Laterality Date  .  ABCESS DRAINAGE Bilateral 2021  . ABDOMINAL HYSTERECTOMY    . BREAST ENHANCEMENT SURGERY    . CERVICAL FUSION    . COLONOSCOPY     about 5 years ago. Been doing cologuard  . ESOPHAGOGASTRODUODENOSCOPY     [place]   . EYE SURGERY Bilateral    lens replacement  . INCISION AND DRAINAGE OF WOUND Bilateral 2021   hip, thigh  . IR THORACENTESIS ASP PLEURAL SPACE W/IMG GUIDE  06/20/2019  . LUMBAR FUSION    . TONSILLECTOMY       Inpatient Medications: Scheduled Meds: . Chlorhexidine Gluconate Cloth  6 each Topical Daily  . cycloSPORINE  1 drop Both Eyes BID  . doxycycline  100 mg Oral BID  . DULoxetine  60 mg Oral Daily  . enoxaparin (LOVENOX) injection  40 mg Subcutaneous Q24H  . feeding supplement (ENSURE ENLIVE)  237 mL Oral BID BM  . folic acid  2 mg Oral Daily  . furosemide  40 mg Intravenous Q12H  . gabapentin  600 mg Oral TID  . melatonin  6 mg Oral QHS  . metoprolol tartrate  25 mg Oral BID  . multivitamin with minerals  1 tablet Oral Daily  . pantoprazole  40 mg Oral BID  . sodium chloride flush  3 mL Intravenous Q12H  . sodium chloride flush  3 mL Intravenous Q12H   Continuous Infusions: .  sodium chloride    .  ceFAZolin (ANCEF) IV 2 g (06/21/19 1307)   PRN Meds: sodium chloride, acetaminophen **OR** acetaminophen, baclofen, lidocaine, polyethylene glycol, sodium chloride flush  Allergies:    Allergies  Allergen Reactions  . Iodinated Diagnostic Agents Anaphylaxis  . Shellfish Allergy Anaphylaxis  . Shellfish-Derived Products Anaphylaxis    Social History:   Social History   Socioeconomic History  . Marital status: Divorced    Spouse name: Not on file  . Number of children: 2  . Years of education: Not on file  . Highest education level: Not on file  Occupational History  . Not on file  Tobacco Use  . Smoking status: Former Smoker    Packs/day: 2.00    Years: 20.00    Pack years: 40.00    Types: Cigarettes    Quit date:  06/05/1982    Years since quitting: 37.0  . Smokeless tobacco: Never Used  Substance and Sexual Activity  . Alcohol use: Not Currently  . Drug use: Never  . Sexual activity: Not Currently  Other Topics Concern  . Not on file  Social History Narrative  . Not on file   Social Determinants of Health   Financial Resource Strain:   . Difficulty of Paying Living Expenses:   Food Insecurity:   . Worried About Charity fundraiser in the Last Year:   . Arboriculturist in the Last Year:   Transportation Needs:   . Film/video editor (Medical):   Marland Kitchen Lack of Transportation (Non-Medical):   Physical Activity:   . Days of Exercise per Week:   . Minutes of Exercise per Session:   Stress:   . Feeling of Stress :   Social Connections:   . Frequency of Communication with Friends and Family:   . Frequency of Social Gatherings with Friends and Family:   . Attends Religious Services:   . Active Member of Clubs or Organizations:   . Attends Archivist Meetings:   Marland Kitchen Marital Status:   Intimate Partner Violence:   . Fear of Current or Ex-Partner:   . Emotionally Abused:   Marland Kitchen Physically Abused:   . Sexually Abused:     Family History:    Family History  Problem Relation Age of Onset  . Asthma Mother   . Asthma Father   . COPD Father   . CAD Father   . Asthma Sister   . Breast cancer Paternal Aunt   . High Cholesterol Brother   . Colon cancer Neg Hx      ROS:  Please see the history of present illness.  All other ROS reviewed and negative.     Physical Exam/Data:   Vitals:   06/21/19 0000 06/21/19 0300 06/21/19 0357 06/21/19 0951  BP:   114/70 114/61  Pulse: 88  85 94  Resp:  16 12 19   Temp:   98.1 F (36.7 C) 98.4 F (36.9 C)  TempSrc:   Oral Oral  SpO2:   99% 100%  Weight:      Height:        Intake/Output Summary (Last 24 hours) at 06/21/2019 1418 Last data filed at 06/21/2019 1307 Gross per 24 hour  Intake 310 ml  Output 4530 ml  Net -4220 ml   Last  3 Weights 06/20/2019 06/11/2019  Weight (lbs) 160 lb 7.9 oz 165 lb 3 oz  Weight (kg) 72.8 kg 74.929 kg     Body mass index is 29.35  kg/m.  General:  Well nourished, well developed, in no acute distress HEENT: normal Lymph: no adenopathy Neck: elevated JVD Endocrine:  No thryomegaly Vascular: No carotid bruits; FA pulses 2+ bilaterally without bruits  Cardiac:  normal S1, S2; RRR; no murmur Lungs:  clear to auscultation bilaterally, no wheezing, rhonchi or rales  Abd: soft, nontender, no hepatomegaly  Ext: 2+ bilateral LE edema to the kneeds Musculoskeletal:  No deformities, BUE and BLE strength normal and equal Skin: warm and dry  Neuro:  CNs 2-12 intact, no focal abnormalities noted Psych:  Normal affect     Laboratory Data:  High Sensitivity Troponin:   Recent Labs  Lab 06/19/19 1935  TROPONINIHS 12     Chemistry Recent Labs  Lab 06/19/19 1935 06/20/19 0513 06/21/19 0422  NA 136 138 136  K 3.8 3.6 3.4*  CL 99 100 99  CO2 27 29 29   GLUCOSE 163* 135* 95  BUN 10 13 13   CREATININE 0.68 0.77 0.73  CALCIUM 8.1* 8.3* 8.1*  GFRNONAA >60 >60 >60  GFRAA >60 >60 >60  ANIONGAP 10 9 8     Recent Labs  Lab 06/19/19 1935 06/20/19 0513 06/21/19 0422  PROT 6.3* 6.0* 5.9*  ALBUMIN 1.8* 1.8* 2.2*  AST 25 21 39  ALT 6 <5 <5  ALKPHOS 145* 131* 154*  BILITOT 0.8 0.6 0.9   Hematology Recent Labs  Lab 06/19/19 1935 06/20/19 0513 06/21/19 0422  WBC 6.3 10.1 8.0  RBC 3.05* 2.77* 2.58*  HGB 9.7* 8.7* 8.3*  HCT 31.5* 28.4* 27.2*  MCV 103.3* 102.5* 105.4*  MCH 31.8 31.4 32.2  MCHC 30.8 30.6 30.5  RDW 17.1* 17.1* 17.2*  PLT 360 340 311   BNP Recent Labs  Lab 06/19/19 1940  BNP 1,843.9*    DDimer No results for input(s): DDIMER in the last 168 hours.   Radiology/Studies:  DG Chest 1 View  Result Date: 06/20/2019 CLINICAL DATA:  Prior thoracentesis. EXAM: CHEST  1 VIEW COMPARISON:  CT 02/19/2020. FINDINGS: PICC line noted with tip over cavoatrial junction.  Heart size normal. Interim decrease in left-sided pleural effusion following thoracentesis. No pneumothorax. Persistent right pleural effusion. Persistent bibasilar atelectasis. Prior cervical spine fusion. IMPRESSION: 1.  Right PICC line noted with tip over cavoatrial junction. 2. Near complete resolution of left-sided pleural effusion following thoracentesis. No evidence of pneumothorax. 3. Persistent right pleural effusion. Persistent bibasilar atelectasis. Electronically Signed   By: Marcello Moores  Register   On: 06/20/2019 09:59   CT CHEST W CONTRAST  Result Date: 06/19/2019 CLINICAL DATA:  Abnormal x-ray. EXAM: CT CHEST WITH CONTRAST TECHNIQUE: Multidetector CT imaging of the chest was performed during intravenous contrast administration. CONTRAST:  17mL OMNIPAQUE IOHEXOL 300 MG/ML  SOLN COMPARISON:  None. FINDINGS: Cardiovascular: Heart size is enlarged without pericardial effusion. Aortic caliber is normal. Left vertebral arising directly from the aortic arch. Central pulmonary arteries are engorged. Main pulmonary artery measuring approximately 2.9 cm. Not well evaluated given bolus timing. Mediastinum/Nodes: No adenopathy in the mediastinum or hilar areas with no axillary lymphadenopathy. Lungs/Pleura: Large bilateral pleural effusions. Spiculated nodule in the right upper lobe measures 12 by 11 mm (image 30, series 7) there is complete collapse of the left lower lobe and partial collapse of the lingula in the left chest. Partial collapse of the right lower lobe due to bilateral pleural effusions. Variable enhancement of collapsed lung at the lung bases also raising the question pneumonia with fluid in distal bronchi. Large airways are patent. Upper Abdomen: Incidental imaging of  upper abdominal contents is unremarkable. Musculoskeletal: Diffuse body wall edema. Ruptured left breast implant with peripheral calcification. No chest wall mass. Signs of cervical spinal fusion with anterolisthesis of C7 on T1 and  extensive degenerative changes at the cervicothoracic junction. No destructive bone finding. IMPRESSION: 1. Large bilateral pleural effusions with complete collapse of the left lower lobe and partial collapse of the lingula in the left chest. No current access to prior imaging which would allow for comparison. Currently in the process of attempting to retrieve prior imaging studies. 2. Variable enhancement of collapsed lung at the lung bases also raising the question of pneumonia with fluid in distal bronchi. This raises the question of current or prior pneumonia. 3. Spiculated nodule in the right upper lobe measures 12 x 11 mm. This is suspicious for malignancy. Though by report the patient has a history of a nodule which may have been improving. 4. Anasarca with body wall edema in addition to effusions. These results will be called to the ordering clinician or representative by the Radiologist Assistant, and communication documented in the PACS or [employer]. Electronically Signed   By: Zetta Bills M.D.   On: 06/19/2019 17:12   DG CHEST PORT 1 VIEW  Result Date: 06/21/2019 CLINICAL DATA:  Shortness of breath and pleural effusion EXAM: PORTABLE CHEST 1 VIEW COMPARISON:  June 20, 2019 FINDINGS: Right PICC line is identified unchanged. The mediastinal contour and cardiac silhouette are stable. Small to moderate left pleural effusion is increased compared prior exam. Moderate right pleural effusion is unchanged. Scoliosis with prior postsurgical change of cervical spine are noted unchanged. IMPRESSION: Small to moderate left pleural effusion increased compared prior exam. Moderate right pleural effusion is unchanged. Electronically Signed   By: Abelardo Diesel M.D.   On: 06/21/2019 11:19   ECHOCARDIOGRAM COMPLETE  Result Date: 06/20/2019    ECHOCARDIOGRAM REPORT   Patient Name:   Samantha Keller Date of Exam: 06/20/2019 Medical Rec #:  [id number]      Height:       62.0 in Accession #:    [id number]      Weight:       160.5 lb Date of Birth:  02-22-1946       BSA:          1.741 m Patient Age:    69 years       BP:           112/63 mmHg Patient Gender: F              HR:           90 bpm. Exam Location:  Inpatient Procedure: 2D Echo Indications:    Dyspnea R06.00  History:        Patient has no prior history of Echocardiogram examinations.  Sonographer:    Mikki Santee RDCS (AE) Referring Phys: [id number] Georgina Quint LATIF [place]  1. Left ventricular ejection fraction, by estimation, is 20 to 25%. The left ventricle has severely decreased function. The left ventricle demonstrates global hypokinesis. Indeterminate diastolic filling due to E-A fusion.  2. Right ventricular systolic function is mildly reduced. The right ventricular size is moderately enlarged. There is moderately elevated pulmonary artery systolic pressure. The estimated right ventricular systolic pressure is 02.5 mmHg.  3. Right atrial size was mild to moderately dilated.  4. The mitral valve is grossly normal. Mild to moderate mitral valve regurgitation.  5. Tricuspid valve regurgitation is moderate.  6. The aortic valve is tricuspid. Aortic  valve regurgitation is not visualized. No aortic stenosis is present.  7. The inferior vena cava is dilated in size with <50% respiratory variability, suggesting right atrial pressure of 15 mmHg. FINDINGS  Left Ventricle: Left ventricular ejection fraction, by estimation, is 20 to 25%. The left ventricle has severely decreased function. The left ventricle demonstrates global hypokinesis. The left ventricular internal cavity size was normal in size. There is no left ventricular hypertrophy. Abnormal (paradoxical) septal motion, consistent with left bundle Ethelmae Ringel block. Indeterminate diastolic filling due to E-A fusion. Right Ventricle: The right ventricular size is moderately enlarged. No increase in right ventricular wall thickness. Right ventricular systolic function is mildly reduced. There is moderately  elevated pulmonary artery systolic pressure. The tricuspid regurgitant velocity is 3.08 m/s, and with an assumed right atrial pressure of 15 mmHg, the estimated right ventricular systolic pressure is 80.9 mmHg. Left Atrium: Left atrial size was normal in size. Right Atrium: Right atrial size was mild to moderately dilated. Pericardium: There is no evidence of pericardial effusion. Presence of pericardial fat pad. Mitral Valve: The mitral valve is grossly normal. Mild to moderate mitral valve regurgitation. Tricuspid Valve: The tricuspid valve is grossly normal. Tricuspid valve regurgitation is moderate. Aortic Valve: The aortic valve is tricuspid. Aortic valve regurgitation is not visualized. No aortic stenosis is present. Pulmonic Valve: The pulmonic valve was grossly normal. Pulmonic valve regurgitation is not visualized. No evidence of pulmonic stenosis. Aorta: The aortic root and ascending aorta are structurally normal, with no evidence of dilitation. Venous: The inferior vena cava is dilated in size with less than 50% respiratory variability, suggesting right atrial pressure of 15 mmHg. IAS/Shunts: The atrial septum is grossly normal. Additional Comments: A venous catheter is visualized in the right atrium. There is a small pleural effusion in the left lateral region.  LEFT VENTRICLE PLAX 2D LVIDd:         4.50 cm     Diastology LVIDs:         3.60 cm     LV e' lateral:   6.30 cm/s LV PW:         1.00 cm     LV E/e' lateral: 12.9 LV IVS:        0.80 cm     LV e' medial:    4.06 cm/s LVOT diam:     1.90 cm     LV E/e' medial:  20.0 LV SV:         31 LV SV Index:   18 LVOT Area:     2.84 cm  LV Volumes (MOD) LV vol d, MOD A2C: 79.9 ml LV vol d, MOD A4C: 72.4 ml LV vol s, MOD A2C: 51.7 ml LV vol s, MOD A4C: 45.7 ml LV SV MOD A2C:     28.2 ml LV SV MOD A4C:     72.4 ml LV SV MOD BP:      29.5 ml RIGHT VENTRICLE RV S prime:     8.70 cm/s TAPSE (M-mode): 1.2 cm LEFT ATRIUM             Index       RIGHT ATRIUM            Index LA diam:        3.50 cm 2.01 cm/m  RA Area:     16.90 cm LA Vol (A2C):   36.2 ml 20.79 ml/m RA Volume:   50.10 ml  28.78 ml/m LA Vol (A4C):   41.3 ml 23.72  ml/m LA Biplane Vol: 38.8 ml 22.29 ml/m  AORTIC VALVE LVOT Vmax:   70.00 cm/s LVOT Vmean:  44.400 cm/s LVOT VTI:    0.108 m  AORTA Ao Root diam: 2.30 cm Ao Asc diam:  3.00 cm MITRAL VALVE               TRICUSPID VALVE MV Area (PHT): 3.99 cm    TR Peak grad:   37.9 mmHg MV Decel Time: 190 msec    TR Vmax:        308.00 cm/s MR Peak grad: 33.6 mmHg MR Vmax:      290.00 cm/s  SHUNTS MV E velocity: 81.00 cm/s  Systemic VTI:  0.11 m MV A velocity: 72.20 cm/s  Systemic Diam: 1.90 cm MV E/A ratio:  1.12 Eleonore Chiquito MD Electronically signed by Eleonore Chiquito MD Signature Date/Time: 06/20/2019/4:09:00 PM    Final    IR THORACENTESIS ASP PLEURAL SPACE W/IMG GUIDE  Result Date: 06/20/2019 INDICATION: Large bilateral pleural effusions with complete left lower lobe collapse, partial collapse of lingula, and spiculated right upper lobe nodule. Request for diagnostic and therapeutic thoracentesis. EXAM: ULTRASOUND GUIDED LEFT THORACENTESIS MEDICATIONS: 1% lidocaine 10 mL COMPLICATIONS: None immediate. PROCEDURE: An ultrasound guided thoracentesis was thoroughly discussed with the patient and questions answered. The benefits, risks, alternatives and complications were also discussed. The patient understands and wishes to proceed with the procedure. Written consent was obtained. Ultrasound was performed to localize and mark an adequate pocket of fluid in the left chest. The area was then prepped and draped in the normal sterile fashion. 1% Lidocaine was used for local anesthesia. Under ultrasound guidance a 6 Fr Safe-T-Centesis catheter was introduced. Thoracentesis was performed. The catheter was removed and a dressing applied. FINDINGS: A total of approximately 650 mL of clear yellow fluid was removed. Samples were sent to the laboratory as requested by the  clinical team. IMPRESSION: Successful ultrasound guided left thoracentesis yielding 650 mL of pleural fluid. No pneumothorax on post-procedure chest x-ray. Read by: Gareth Eagle, PA-C Electronically Signed   By: Lucrezia Europe M.D.   On: 06/20/2019 10:11   {  Assessment and Plan:   1. Acute systolic heart failure - 07/4560 echo LVEF 20-25%, mild RV dysfunction, PASP 53 - BNP 1800 on admission - negative 2.2 L yesterday, neg 4.2 L since admission. She is on lasix 40mg  IV bid. Renal function is stable. Remains massively volume overloaded, increase lasix to 60mg  iv bid.   - hold beta blocker until more euvolemic - start losartan 12.5mg  daily - history of prior GI bleed with progressing anemia this admission, no plans for inpatient cath. Also lung nodule and recent issues with MSSA bacteremia on abx - continue IV diuresis, will require several days      For questions or updates, please contact [place] Please consult [email or web address] for contact info under     Signed, Carlyle Dolly, MD  06/21/2019 2:18 PM

## 2019-06-21 NOTE — Progress Notes (Signed)
VASCULAR LAB PRELIMINARY  PRELIMINARY  PRELIMINARY  PRELIMINARY  Bilateral lower extremity venous duplex completed.    Preliminary report:  See CV proc for preliminary results.  Tavone Caesar, RVT 06/21/2019, 2:47 PM

## 2019-06-21 NOTE — Progress Notes (Signed)
PROGRESS NOTE    Samantha Keller  TDV:[id number] DOB: March 21, 1946 DOA: 06/19/2019 PCP: Patient, [name]   Brief Narrative:  HPI per Dr. Christia Reading Opyd 06/19/19 Samantha Keller is a 74 y.o. female with medical history significant for rheumatoid arthritis previously on Humira, chronic pain, and complicated hospitalization in [place] beginning in late January and involving MSSA bacteremia, now presenting to the ED for evaluation of concerning CT chest findings.  Patient had CT chest performed today with large bilateral pleural effusions, complete collapse of left lower lobe, partial collapse of the lingula, and spiculated nodule in the right upper lobe.  She was directed to the ED for evaluation of this.  Patient reports chronic dyspnea and cough but has not appreciated any acute change and denies any recent fevers, chills, chest pain, or palpitations.  Patient had a complicated hospitalization beginning in late January when she presented with fevers, confusion, and hypoxia, and was found to be hyponatremic with AKI, right upper lobe mass concerning for pneumonia versus cancer, MSSA bacteremia, lower extremity cellulitis with abscess, and upper GI bleed.  She had TEE negative for vegetation, had vegetation at the tip of her central line, underwent I&D abscess, and was eventually discharged to a local SNF with PICC for long course of cefazolin.  She has since established with local GI, pulmonology, and ID, and had an outpatient CT chest performed today with findings that prompted her current presentation.  ED Course: Upon arrival to the ED, patient is found to be afebrile, saturating 100% on 2 L/min of supplemental oxygen, slightly tachycardic, and with stable blood pressure.  EKG features sinus rhythm with LVH, secondary repolarization abnormality, and QTc interval 501 ms.  Chemistry panel notable for albumin of 1.8.  CBC with hemoglobin of 9.7 and MCV 103.3.  BNP is elevated to 1844.   Troponin and INR are normal.  Infectious disease was consulted by the ED physician, influenza and Covid PCR are in process, and hospitalist asked to admit.  **Interim History  Patient continues to be significantly volume overloaded and states that her shortness of breath is little bit better.  We will further work-up her anasarca and given that she had an elevated BNP will order echocardiogram and lower extremity venous duplex to rule out DVT.  Echocardiogram showed an EF of 20 to 25% and from review of outside records from the hospital she had an EF of 50 to 55% in January.  Cardiology was consulted for her new systolic CHF and they recommend against cath at this time and recommending medical management and diuresis and they have started her on losartan.  Only duplex was negative for DVT.  Assessment & Plan:   Principal Problem:   Large pleural effusion Active Problems:   Bacteremia due to methicillin susceptible Staphylococcus aureus (MSSA)   History of GI bleed   Rheumatoid arthritis (HCC)   Anemia   Nodule of right lung   Anasarca   Protein-calorie malnutrition, mild (HCC)   Pleural effusion  Large pleural effusions; right lung nodule; chronic hypoxic respiratory failure in the setting of acute systolic CHF with a EF of 20-25% - Patient has been on cefazolin at her SNF for MSSA bacteremia secondary to LE cellulitis vs PNA and had follow-up chest CT today,  -Was found to have large bilateral pleural effusions with complete LLL collapse, partial collapse of lingula, and spiculated RUL nodule  - She is stable on her usual 2 Lpm and denies any recent change in  chronic cough and dyspnea  - Consult IR for thoracentesis with fluid analysis including cytology and this was done yesterday  -Monocyte-macrocytosis fluid was 18 and straw-colored fluid movements 11 650 mL was removed  -Repeat chest x-ray this AM showed "Small to moderate left pleural effusion increased compared prior exam. Moderate  right pleural effusion is unchanged." -Echocardiogram to rule out Cardiac Causes and showed "Left ventricular ejection fraction, by estimation, is 20 to 25%. The Left ventricle has severely decreased function. The left ventricle  demonstrates global hypokinesis. Indeterminate diastolic filling due to E-A fusion.  Right ventricular systolic function is mildly reduced. The right  ventricular size is moderately enlarged. There is moderately elevated  pulmonary artery systolic pressure. The estimated right ventricular  systolic pressure is 57.8 mmHg. Right atrial size was mild to moderately dilated." -Gven albumin and Lasix combination x1 yesterday and started Scheduled Diuresis with IV Lasix 40 mg q12h today and Cardiology was consulted and increased this to 60 mg IV BID -Strict I's and O's and daily weights; She is -4.267 Liters since admission and Weight is normally 115 and she is 160.5 -BP was elevated on admission at 1843 and does have CHF -Repeat chest x-ray in a.m.   MSSA bacteremia   - Admitted to a hospital in [place] in late January with MSSA bacteremia, suspected PNA, and LE cellulitis with abscess  - She had TEE without vegetation; there was vegetation at tip of central line  - She has been on cefazolin with end date 06/25/2019; she is also on doxycycline  - ID consulted by ED physician and much appreciate  - No fever or leukocytosis on admission  - Continue cefazolin and doxy, repeat blood cultures  and showed no growth to date less than 12 hours -Infectious disease recommends continuing current treatment with Cefazolin and po Doxy  -She underwent a thoracentesis yesterday  -Repeat chest x-ray in the a.m.   Anemia; recent UGIB  - Hgb is 9.7 in ED, improved from earlier this month and i from 9.7/31.5 down to 8.7/20.4 and today it is 8.3/27.2 - She is established with local GI and planned for EGD in April  - Continue Protonix  -Need to monitor for signs and symptoms of bleeding;  currently no overt bleeding noted -Repeat CBC in a.m.   Rheumatoid arthritis; chronic pain  - Continue Cymbalta, gabapentin, as-needed baclofen    Anasarca and volume overload likely multifactorial in the setting of poor nutritional status and acute systolic CHF with an EF of 20 to 25% -In the setting of poor nutritional status and low albumin 1.8 and prealbumin 9.8 and now acute systolic CHF -Consult nutritionist and they recommended a speech eval because the patient was on pured diet; SLP has been called the diet will remain on mechanical soft with thin liquids -Give albumin 25 g as well as IV Lasix 40 mg and see volume response yesterday but will start scheduled diuresis with IV 40 mg of Lasix and cardiology increase this to IV 60 -Check echocardiogram and lower extremity venous duplex to rule out DVT -BNP was elevated so echocardiogram was done and showed findings as below -Continue with Ensure Enlive p.o. twice daily as well as a multivitamin daily -Unlikely the patient's anasarca is related to nephrotic syndrome given the lack of proteinuria on her urinalysis -Check LE Venous Duplex to r/o DVT and this was negative -Cardiology was consulted for further evaluation recommendations and appreciate their assistance and Dr. Harl Bowie feels that she remains massively volume overloaded  and recommends to continue to hold beta-blocker until more euvolemic and start losartan 12.5 mg p.o. daily -Dr. Harl Bowie feels that she will require several days of IV diuresis  Hypomagnesemia -Patient's magnesium level was 1.6 Replete with IV mag sulfate 2 g -Continue to monitor and replete as necessary -Repeat magnesium level in the a.m.  Hypokalemia -Patient's potassium was 3.4 -Likely in the setting of IV diuresis  -likely will require scheduled potassium replacement  -continue to monitor and replete as necessary -Repeat CMP in AM   DVT prophylaxis: Enoxaparin 40 mg sq q24h Code Status: FULL CODE    Family Communication: Daughter was not at bedside but she was listening over the phone and I spoke with the patient Disposition Plan: Pending improvement in Volume Status and likely will go back to SNF  Consultants:   Infectious Diseases    Procedures:  ECHOCARDIOGRAM IMPRESSIONS    1. Left ventricular ejection fraction, by estimation, is 20 to 25%. The  left ventricle has severely decreased function. The left ventricle  demonstrates global hypokinesis. Indeterminate diastolic filling due to  E-A fusion.  2. Right ventricular systolic function is mildly reduced. The right  ventricular size is moderately enlarged. There is moderately elevated  pulmonary artery systolic pressure. The estimated right ventricular  systolic pressure is 75.1 mmHg.  3. Right atrial size was mild to moderately dilated.  4. The mitral valve is grossly normal. Mild to moderate mitral valve  regurgitation.  5. Tricuspid valve regurgitation is moderate.  6. The aortic valve is tricuspid. Aortic valve regurgitation is not  visualized. No aortic stenosis is present.  7. The inferior vena cava is dilated in size with <50% respiratory  variability, suggesting right atrial pressure of 15 mmHg.   FINDINGS  Left Ventricle: Left ventricular ejection fraction, by estimation, is 20  to 25%. The left ventricle has severely decreased function. The left  ventricle demonstrates global hypokinesis. The left ventricular internal  cavity size was normal in size. There  is no left ventricular hypertrophy. Abnormal (paradoxical) septal motion,  consistent with left bundle branch block. Indeterminate diastolic filling  due to E-A fusion.   Right Ventricle: The right ventricular size is moderately enlarged. No  increase in right ventricular wall thickness. Right ventricular systolic  function is mildly reduced. There is moderately elevated pulmonary artery  systolic pressure. The tricuspid  regurgitant velocity  is 3.08 m/s, and with an assumed right atrial  pressure of 15 mmHg, the estimated right ventricular systolic pressure is  02.5 mmHg.   Left Atrium: Left atrial size was normal in size.   Right Atrium: Right atrial size was mild to moderately dilated.   Pericardium: There is no evidence of pericardial effusion. Presence of  pericardial fat pad.   Mitral Valve: The mitral valve is grossly normal. Mild to moderate mitral  valve regurgitation.   Tricuspid Valve: The tricuspid valve is grossly normal. Tricuspid valve  regurgitation is moderate.   Aortic Valve: The aortic valve is tricuspid. Aortic valve regurgitation is  not visualized. No aortic stenosis is present.   Pulmonic Valve: The pulmonic valve was grossly normal. Pulmonic valve  regurgitation is not visualized. No evidence of pulmonic stenosis.   Aorta: The aortic root and ascending aorta are structurally normal, with  no evidence of dilitation.   Venous: The inferior vena cava is dilated in size with less than 50%  respiratory variability, suggesting right atrial pressure of 15 mmHg.   IAS/Shunts: The atrial septum is grossly normal.  Additional Comments: A venous catheter is visualized in the right atrium.  There is a small pleural effusion in the left lateral region.     LEFT VENTRICLE  PLAX 2D  LVIDd:     4.50 cm   Diastology  LVIDs:     3.60 cm   LV e' lateral:  6.30 cm/s  LV PW:     1.00 cm   LV E/e' lateral: 12.9  LV IVS:    0.80 cm   LV e' medial:  4.06 cm/s  LVOT diam:   1.90 cm   LV E/e' medial: 20.0  LV SV:     31  LV SV Index:  18  LVOT Area:   2.84 cm    LV Volumes (MOD)  LV vol d, MOD A2C: 79.9 ml  LV vol d, MOD A4C: 72.4 ml  LV vol s, MOD A2C: 51.7 ml  LV vol s, MOD A4C: 45.7 ml  LV SV MOD A2C:   28.2 ml  LV SV MOD A4C:   72.4 ml  LV SV MOD BP:   29.5 ml   RIGHT VENTRICLE  RV S prime:   8.70 cm/s  TAPSE (M-mode): 1.2 cm   LEFT ATRIUM        Index    RIGHT ATRIUM      Index  LA diam:    3.50 cm 2.01 cm/m RA Area:   16.90 cm  LA Vol (A2C):  36.2 ml 20.79 ml/m RA Volume:  50.10 ml 28.78 ml/m  LA Vol (A4C):  41.3 ml 23.72 ml/m  LA Biplane Vol: 38.8 ml 22.29 ml/m  AORTIC VALVE  LVOT Vmax:  70.00 cm/s  LVOT Vmean: 44.400 cm/s  LVOT VTI:  0.108 m    AORTA  Ao Root diam: 2.30 cm  Ao Asc diam: 3.00 cm   MITRAL VALVE        TRICUSPID VALVE  MV Area (PHT): 3.99 cm  TR Peak grad:  37.9 mmHg  MV Decel Time: 190 msec  TR Vmax:    308.00 cm/s  MR Peak grad: 33.6 mmHg  MR Vmax:   290.00 cm/s SHUNTS  MV E velocity: 81.00 cm/s Systemic VTI: 0.11 m  MV A velocity: 72.20 cm/s Systemic Diam: 1.90 cm  MV E/A ratio: 1.12   LE VENOUS DUPLEX BILATERAL:  - No evidence of deep vein thrombosis seen in the lower extremities,  bilaterally.   Antimicrobials:  Anti-infectives (From admission, onward)   Start     Dose/Rate Route Frequency Ordered Stop   06/19/19 2315  doxycycline (VIBRA-TABS) tablet 100 mg     100 mg Oral 2 times daily 06/19/19 2304     06/19/19 2315  ceFAZolin (ANCEF) IVPB 2g/100 mL premix     2 g 200 mL/hr over 30 Minutes Intravenous Every 8 hours 06/19/19 2306       Subjective: Seen and examined at bedside states that she is doing okay but continues remain massively volume overloaded.  No nausea or vomiting.  She is shortness of breath is stable.  No other concerns or complaints at this time.  Objective: Vitals:   06/21/19 0000 06/21/19 0300 06/21/19 0357 06/21/19 0951  BP:   114/70 114/61  Pulse: 88  85 94  Resp:  16 12 19   Temp:   98.1 F (36.7 C) 98.4 F (36.9 C)  TempSrc:   Oral Oral  SpO2:   99% 100%  Weight:      Height:  Intake/Output Summary (Last 24 hours) at 06/21/2019 1709 Last data filed at 06/21/2019 1307 Gross per 24 hour  Intake 200 ml  Output 4200 ml  Net -4000 ml   Filed Weights   06/20/19 0150  Weight: 72.8 kg    Examination: Physical Exam:  Constitutional: WN/WD overweight Caucasian female currently in no acute distress appears calm  Eyes: Lids and conjunctivae normal, sclerae anicteric  ENMT: External Ears, Nose appear normal. Grossly normal hearing. Mucous membranes are moist.  Neck: Appears normal, supple, no cervical masses, normal ROM, no appreciable thyromegaly; no JVD Respiratory: Diminished to auscultation bilaterally with some crackles bilaterally and coarse breath sounds, no wheezing, rales, rhonchi or crackles. Normal respiratory effort and patient is not tachypenic.  She is wearing 2 L supplemental oxygen via nasal cannula has unlabored breathing Cardiovascular: RRR, no murmurs / rubs / gallops. S1 and S2 auscultated.  Has 2+ to 3+ lower extremity edema Abdomen: Soft, non-tender, distended second body habitus. Bowel sounds positive.  GU: Deferred. Musculoskeletal: No clubbing / cyanosis of digits/nails. No joint deformity upper and lower extremities. Skin: No rashes, lesions, ulcers on limited skin evaluation. No induration; Warm and dry.  Neurologic: CN 2-12 grossly intact with no focal deficits. Romberg sign and cerebellar reflexes not assessed.  Psychiatric: Normal judgment and insight. Alert and oriented x 3. Normal mood and appropriate affect.   Data Reviewed: I have personally reviewed following labs and imaging studies  CBC: Recent Labs  Lab 06/19/19 1935 06/20/19 0513 06/21/19 0422  WBC 6.3 10.1 8.0  NEUTROABS 3.8 6.0 3.8  HGB 9.7* 8.7* 8.3*  HCT 31.5* 28.4* 27.2*  MCV 103.3* 102.5* 105.4*  PLT 360 340 858   Basic Metabolic Panel: Recent Labs  Lab 06/19/19 1935 06/20/19 0513 06/21/19 0422  NA 136 138 136  K 3.8 3.6 3.4*  CL 99 100 99  CO2 27 29 29   GLUCOSE 163* 135* 95  BUN 10 13 13   CREATININE 0.68 0.77 0.73  CALCIUM 8.1* 8.3* 8.1*  MG  --   --  1.6*  PHOS  --   --  3.7   GFR: Estimated Creatinine Clearance: 59.4 mL/min (by C-G formula based on SCr of  0.73 mg/dL). Liver Function Tests: Recent Labs  Lab 06/19/19 1935 06/20/19 0513 06/21/19 0422  AST 25 21 39  ALT 6 <5 <5  ALKPHOS 145* 131* 154*  BILITOT 0.8 0.6 0.9  PROT 6.3* 6.0* 5.9*  ALBUMIN 1.8* 1.8* 2.2*   No results for input(s): LIPASE, AMYLASE in the last 168 hours. No results for input(s): AMMONIA in the last 168 hours. Coagulation Profile: Recent Labs  Lab 06/19/19 1935  INR 1.1   Cardiac Enzymes: No results for input(s): CKTOTAL, CKMB, CKMBINDEX, TROPONINI in the last 168 hours. BNP (last 3 results) No results for input(s): PROBNP in the last 8760 hours. HbA1C: No results for input(s): HGBA1C in the last 72 hours. CBG: No results for input(s): GLUCAP in the last 168 hours. Lipid Profile: No results for input(s): CHOL, HDL, LDLCALC, TRIG, CHOLHDL, LDLDIRECT in the last 72 hours. Thyroid Function Tests: No results for input(s): TSH, T4TOTAL, FREET4, T3FREE, THYROIDAB in the last 72 hours. Anemia Panel: No results for input(s): VITAMINB12, FOLATE, FERRITIN, TIBC, IRON, RETICCTPCT in the last 72 hours. Sepsis Labs: Recent Labs  Lab 06/20/19 0513  PROCALCITON <0.10    Recent Results (from the past 240 hour(s))  Respiratory Panel by RT PCR (Flu A&B, Covid) - Nasopharyngeal Swab     Status: None  Collection Time: 06/19/19  8:24 PM   Specimen: Nasopharyngeal Swab  Result Value Ref Range Status   SARS Coronavirus 2 by RT PCR NEGATIVE NEGATIVE Final    Comment: (NOTE) SARS-CoV-2 target nucleic acids are NOT DETECTED. The SARS-CoV-2 RNA is generally detectable in upper respiratoy specimens during the acute phase of infection. The lowest concentration of SARS-CoV-2 viral copies this assay can detect is 131 copies/mL. A negative result does not preclude SARS-Cov-2 infection and should not be used as the sole basis for treatment or other patient management decisions. A negative result may occur with  improper specimen collection/handling, submission of  specimen other than nasopharyngeal swab, presence of viral mutation(s) within the areas targeted by this assay, and inadequate number of viral copies (<131 copies/mL). A negative result must be combined with clinical observations, patient history, and epidemiological information. The expected result is Negative. Fact Sheet for Patients:  PinkCheek.be Fact Sheet for Healthcare Providers:  GravelBags.it This test is not yet ap proved or cleared by the [place] FDA and  has been authorized for detection and/or diagnosis of SARS-CoV-2 by FDA under an Emergency Use Authorization (EUA). This EUA will remain  in effect (meaning this test can be used) for the duration of the COVID-19 declaration under Section 564(b)(1) of the Act, 21 U.S.C. section 360bbb-3(b)(1), unless the authorization is terminated or revoked sooner.    Influenza A by PCR NEGATIVE NEGATIVE Final   Influenza B by PCR NEGATIVE NEGATIVE Final    Comment: (NOTE) The Xpert Xpress SARS-CoV-2/FLU/RSV assay is intended as an aid in  the diagnosis of influenza from Nasopharyngeal swab specimens and  should not be used as a sole basis for treatment. Nasal washings and  aspirates are unacceptable for Xpert Xpress SARS-CoV-2/FLU/RSV  testing. Fact Sheet for Patients: PinkCheek.be Fact Sheet for Healthcare Providers: GravelBags.it This test is not yet approved or cleared by the [place] FDA and  has been authorized for detection and/or diagnosis of SARS-CoV-2 by  FDA under an Emergency Use Authorization (EUA). This EUA will remain  in effect (meaning this test can be used) for the duration of the  Covid-19 declaration under Section 564(b)(1) of the Act, 21  U.S.C. section 360bbb-3(b)(1), unless the authorization is  terminated or revoked. Performed at [facility], [place] [address] [place],  [place] [address]   Culture, blood (routine x 2)     Status: None (Preliminary result)   Collection Time: 06/19/19 11:25 PM   Specimen: BLOOD  Result Value Ref Range Status   Specimen Description BLOOD RIGHT ARM  Final   Special Requests   Final    BOTTLES DRAWN AEROBIC AND ANAEROBIC Blood Culture adequate volume   Culture   Final    NO GROWTH 2 DAYS Performed at [facility], [place] [address] [place], [place] [address]    Report Status PENDING  Incomplete  Culture, blood (routine x 2)     Status: None (Preliminary result)   Collection Time: 06/19/19 11:30 PM   Specimen: BLOOD  Result Value Ref Range Status   Specimen Description BLOOD LEFT HAND  Final   Special Requests   Final    BOTTLES DRAWN AEROBIC AND ANAEROBIC Blood Culture adequate volume   Culture   Final    NO GROWTH 2 DAYS Performed at [facility], [place] [address], [place],  [address]    Report Status PENDING  Incomplete  MRSA PCR Screening     Status: None  Collection Time: 06/20/19  8:59 AM   Specimen: Nasal Mucosa; Nasopharyngeal  Result Value Ref Range Status   MRSA by PCR NEGATIVE NEGATIVE Final    Comment:        The GeneXpert MRSA Assay (FDA approved for NASAL specimens only), is one component of a comprehensive MRSA colonization surveillance program. It is not intended to diagnose MRSA infection nor to guide or monitor treatment for MRSA infections. Performed at [facility], [place] [address] [place], [place] [address]   Gram stain     Status: None   Collection Time: 06/20/19 10:56 AM   Specimen: PATH Cytology Pleural fluid  Result Value Ref Range Status   Specimen Description FLUID  Final   Special Requests NONE  Final   Gram Stain   Final    CYTOSPIN SMEAR WBC PRESENT, PREDOMINANTLY MONONUCLEAR NO ORGANISMS SEEN Performed at [facility], [place] [address], [place], [place] [address]    Report Status 06/20/2019 FINAL  Final  Culture, body fluid-bottle     Status: None  (Preliminary result)   Collection Time: 06/20/19 10:58 AM   Specimen: Fluid  Result Value Ref Range Status   Specimen Description FLUID  Final   Special Requests NONE  Final   Culture   Final    NO GROWTH 1 DAY Performed at [facility], [place] [address], [place], [place] [address]    Report Status PENDING  Incomplete     RN Pressure Injury Documentation:     Estimated body mass index is 29.35 kg/m as calculated from the following:   Height as of this encounter: 5\' 2"  (1.575 m).   Weight as of this encounter: 72.8 kg.  Malnutrition Type:  Nutrition Problem: Inadequate oral intake Etiology: decreased appetite   Malnutrition Characteristics:  Signs/Symptoms: per patient/family report   Nutrition Interventions:  Interventions: Ensure Enlive (each supplement provides 350kcal and 20 grams of protein), MVI   Radiology Studies: DG Chest 1 View  Result Date: 06/20/2019 CLINICAL DATA:  Prior thoracentesis. EXAM: CHEST  1 VIEW COMPARISON:  CT 02/19/2020. FINDINGS: PICC line noted with tip over cavoatrial junction. Heart size normal. Interim decrease in left-sided pleural effusion following thoracentesis. No pneumothorax. Persistent right pleural effusion. Persistent bibasilar atelectasis. Prior cervical spine fusion. IMPRESSION: 1.  Right PICC line noted with tip over cavoatrial junction. 2. Near complete resolution of left-sided pleural effusion following thoracentesis. No evidence of pneumothorax. 3. Persistent right pleural effusion. Persistent bibasilar atelectasis. Electronically Signed   By: Marcello Moores  Register   On: 06/20/2019 09:59   DG CHEST PORT 1 VIEW  Result Date: 06/21/2019 CLINICAL DATA:  Shortness of breath and pleural effusion EXAM: PORTABLE CHEST 1 VIEW COMPARISON:  June 20, 2019 FINDINGS: Right PICC line is identified unchanged. The mediastinal contour and cardiac silhouette are stable. Small to moderate left pleural effusion is increased compared prior exam.  Moderate right pleural effusion is unchanged. Scoliosis with prior postsurgical change of cervical spine are noted unchanged. IMPRESSION: Small to moderate left pleural effusion increased compared prior exam. Moderate right pleural effusion is unchanged. Electronically Signed   By: Abelardo Diesel M.D.   On: 06/21/2019 11:19   ECHOCARDIOGRAM COMPLETE  Result Date: 06/20/2019    ECHOCARDIOGRAM REPORT   Patient Name:   Samantha Keller Date of Exam: 06/20/2019 Medical Rec #:  [id number]      Height:       62.0 in Accession #:    [id number]     Weight:  160.5 lb Date of Birth:  1945-10-13       BSA:          1.741 m Patient Age:    60 years       BP:           112/63 mmHg Patient Gender: F              HR:           90 bpm. Exam Location:  Inpatient Procedure: 2D Echo Indications:    Dyspnea R06.00  History:        Patient has no prior history of Echocardiogram examinations.  Sonographer:    Mikki Santee RDCS (AE) Referring Phys: [id number] Georgina Quint LATIF [place]  1. Left ventricular ejection fraction, by estimation, is 20 to 25%. The left ventricle has severely decreased function. The left ventricle demonstrates global hypokinesis. Indeterminate diastolic filling due to E-A fusion.  2. Right ventricular systolic function is mildly reduced. The right ventricular size is moderately enlarged. There is moderately elevated pulmonary artery systolic pressure. The estimated right ventricular systolic pressure is 36.6 mmHg.  3. Right atrial size was mild to moderately dilated.  4. The mitral valve is grossly normal. Mild to moderate mitral valve regurgitation.  5. Tricuspid valve regurgitation is moderate.  6. The aortic valve is tricuspid. Aortic valve regurgitation is not visualized. No aortic stenosis is present.  7. The inferior vena cava is dilated in size with <50% respiratory variability, suggesting right atrial pressure of 15 mmHg. FINDINGS  Left Ventricle: Left ventricular ejection fraction, by  estimation, is 20 to 25%. The left ventricle has severely decreased function. The left ventricle demonstrates global hypokinesis. The left ventricular internal cavity size was normal in size. There is no left ventricular hypertrophy. Abnormal (paradoxical) septal motion, consistent with left bundle branch block. Indeterminate diastolic filling due to E-A fusion. Right Ventricle: The right ventricular size is moderately enlarged. No increase in right ventricular wall thickness. Right ventricular systolic function is mildly reduced. There is moderately elevated pulmonary artery systolic pressure. The tricuspid regurgitant velocity is 3.08 m/s, and with an assumed right atrial pressure of 15 mmHg, the estimated right ventricular systolic pressure is 44.0 mmHg. Left Atrium: Left atrial size was normal in size. Right Atrium: Right atrial size was mild to moderately dilated. Pericardium: There is no evidence of pericardial effusion. Presence of pericardial fat pad. Mitral Valve: The mitral valve is grossly normal. Mild to moderate mitral valve regurgitation. Tricuspid Valve: The tricuspid valve is grossly normal. Tricuspid valve regurgitation is moderate. Aortic Valve: The aortic valve is tricuspid. Aortic valve regurgitation is not visualized. No aortic stenosis is present. Pulmonic Valve: The pulmonic valve was grossly normal. Pulmonic valve regurgitation is not visualized. No evidence of pulmonic stenosis. Aorta: The aortic root and ascending aorta are structurally normal, with no evidence of dilitation. Venous: The inferior vena cava is dilated in size with less than 50% respiratory variability, suggesting right atrial pressure of 15 mmHg. IAS/Shunts: The atrial septum is grossly normal. Additional Comments: A venous catheter is visualized in the right atrium. There is a small pleural effusion in the left lateral region.  LEFT VENTRICLE PLAX 2D LVIDd:         4.50 cm     Diastology LVIDs:         3.60 cm     LV e'  lateral:   6.30 cm/s LV PW:         1.00 cm  LV E/e' lateral: 12.9 LV IVS:        0.80 cm     LV e' medial:    4.06 cm/s LVOT diam:     1.90 cm     LV E/e' medial:  20.0 LV SV:         31 LV SV Index:   18 LVOT Area:     2.84 cm  LV Volumes (MOD) LV vol d, MOD A2C: 79.9 ml LV vol d, MOD A4C: 72.4 ml LV vol s, MOD A2C: 51.7 ml LV vol s, MOD A4C: 45.7 ml LV SV MOD A2C:     28.2 ml LV SV MOD A4C:     72.4 ml LV SV MOD BP:      29.5 ml RIGHT VENTRICLE RV S prime:     8.70 cm/s TAPSE (M-mode): 1.2 cm LEFT ATRIUM             Index       RIGHT ATRIUM           Index LA diam:        3.50 cm 2.01 cm/m  RA Area:     16.90 cm LA Vol (A2C):   36.2 ml 20.79 ml/m RA Volume:   50.10 ml  28.78 ml/m LA Vol (A4C):   41.3 ml 23.72 ml/m LA Biplane Vol: 38.8 ml 22.29 ml/m  AORTIC VALVE LVOT Vmax:   70.00 cm/s LVOT Vmean:  44.400 cm/s LVOT VTI:    0.108 m  AORTA Ao Root diam: 2.30 cm Ao Asc diam:  3.00 cm MITRAL VALVE               TRICUSPID VALVE MV Area (PHT): 3.99 cm    TR Peak grad:   37.9 mmHg MV Decel Time: 190 msec    TR Vmax:        308.00 cm/s MR Peak grad: 33.6 mmHg MR Vmax:      290.00 cm/s  SHUNTS MV E velocity: 81.00 cm/s  Systemic VTI:  0.11 m MV A velocity: 72.20 cm/s  Systemic Diam: 1.90 cm MV E/A ratio:  1.12 Eleonore Chiquito MD Electronically signed by Eleonore Chiquito MD Signature Date/Time: 06/20/2019/4:09:00 PM    Final    VAS [place] LOWER EXTREMITY VENOUS (DVT)  Result Date: 06/21/2019  Lower Venous DVTStudy Indications: Edema.  Comparison Study: no prior study Performing Technologist: Sharion Dove RVS  Examination Guidelines: A complete evaluation includes B-mode imaging, spectral Doppler, color Doppler, and power Doppler as needed of all accessible portions of each vessel. Bilateral testing is considered an integral part of a complete examination. Limited examinations for reoccurring indications may be performed as noted. The reflux portion of the exam is performed with the patient in reverse Trendelenburg.   +---------+---------------+---------+-----------+----------+--------------+ RIGHT    CompressibilityPhasicitySpontaneityPropertiesThrombus Aging +---------+---------------+---------+-----------+----------+--------------+ CFV      Full                                         pulsatile      +---------+---------------+---------+-----------+----------+--------------+ SFJ      Full                                                        +---------+---------------+---------+-----------+----------+--------------+ FV  Prox  Full                                                        +---------+---------------+---------+-----------+----------+--------------+ FV Mid   Full                                                        +---------+---------------+---------+-----------+----------+--------------+ FV DistalFull                                                        +---------+---------------+---------+-----------+----------+--------------+ PFV      Full                                                        +---------+---------------+---------+-----------+----------+--------------+ POP      Full                                         pulsatile      +---------+---------------+---------+-----------+----------+--------------+ PTV      Full                                                        +---------+---------------+---------+-----------+----------+--------------+ PERO     Full                                                        +---------+---------------+---------+-----------+----------+--------------+   +---------+---------------+---------+-----------+----------+--------------+ LEFT     CompressibilityPhasicitySpontaneityPropertiesThrombus Aging +---------+---------------+---------+-----------+----------+--------------+ CFV      Full                                         pulsatile       +---------+---------------+---------+-----------+----------+--------------+ SFJ      Full                                                        +---------+---------------+---------+-----------+----------+--------------+ FV Prox  Full                                                        +---------+---------------+---------+-----------+----------+--------------+  FV Mid   Full                                                        +---------+---------------+---------+-----------+----------+--------------+ FV DistalFull                                                        +---------+---------------+---------+-----------+----------+--------------+ PFV      Full                                                        +---------+---------------+---------+-----------+----------+--------------+ POP      Full                                         pulsatile      +---------+---------------+---------+-----------+----------+--------------+ PTV      Full                                                        +---------+---------------+---------+-----------+----------+--------------+ PERO     Full                                                        +---------+---------------+---------+-----------+----------+--------------+     Summary: BILATERAL: - No evidence of deep vein thrombosis seen in the lower extremities, bilaterally.   *See table(s) above for measurements and observations.    Preliminary    IR THORACENTESIS ASP PLEURAL SPACE W/IMG GUIDE  Result Date: 06/20/2019 INDICATION: Large bilateral pleural effusions with complete left lower lobe collapse, partial collapse of lingula, and spiculated right upper lobe nodule. Request for diagnostic and therapeutic thoracentesis. EXAM: ULTRASOUND GUIDED LEFT THORACENTESIS MEDICATIONS: 1% lidocaine 10 mL COMPLICATIONS: None immediate. PROCEDURE: An ultrasound guided thoracentesis was thoroughly discussed with the  patient and questions answered. The benefits, risks, alternatives and complications were also discussed. The patient understands and wishes to proceed with the procedure. Written consent was obtained. Ultrasound was performed to localize and mark an adequate pocket of fluid in the left chest. The area was then prepped and draped in the normal sterile fashion. 1% Lidocaine was used for local anesthesia. Under ultrasound guidance a 6 Fr Safe-T-Centesis catheter was introduced. Thoracentesis was performed. The catheter was removed and a dressing applied. FINDINGS: A total of approximately 650 mL of clear yellow fluid was removed. Samples were sent to the laboratory as requested by the clinical team. IMPRESSION: Successful ultrasound guided left thoracentesis yielding 650 mL of pleural fluid. No pneumothorax on post-procedure chest x-ray. Read by: Gareth Eagle, PA-C Electronically Signed   By: Eden Emms.D.  On: 06/20/2019 10:11   Scheduled Meds: . Chlorhexidine Gluconate Cloth  6 each Topical Daily  . cycloSPORINE  1 drop Both Eyes BID  . doxycycline  100 mg Oral BID  . DULoxetine  60 mg Oral Daily  . enoxaparin (LOVENOX) injection  40 mg Subcutaneous Q24H  . feeding supplement (ENSURE ENLIVE)  237 mL Oral BID BM  . folic acid  2 mg Oral Daily  . furosemide  60 mg Intravenous Q12H  . gabapentin  600 mg Oral TID  . losartan  12.5 mg Oral Daily  . melatonin  6 mg Oral QHS  . metoprolol tartrate  25 mg Oral BID  . multivitamin with minerals  1 tablet Oral Daily  . pantoprazole  40 mg Oral BID  . sodium chloride flush  3 mL Intravenous Q12H  . sodium chloride flush  3 mL Intravenous Q12H   Continuous Infusions: . sodium chloride    .  ceFAZolin (ANCEF) IV 2 g (06/21/19 1307)    LOS: 1 day    Kerney Elbe, DO [facility] PAGER is on [place]  If 7PM-7AM, please contact night-coverage [email or web address]

## 2019-06-21 NOTE — Evaluation (Addendum)
Clinical/Bedside Swallow Evaluation Patient Details  Name: Samantha Keller MRN: [id number] Date of Birth: 04/19/1945  Today's Date: 06/21/2019 Time: SLP Start Time (ACUTE ONLY): 1660 SLP Stop Time (ACUTE ONLY): 0910 SLP Time Calculation (min) (ACUTE ONLY): 27 min  Past Medical History:  Past Medical History:  Diagnosis Date  . Abscess of bursa of right hip   . Acute kidney failure ([place])   . Alcohol withdrawal ([place])   . Asthma   . Cellulitis of unspecified part of limb   . Chronic tension type headache   . Depression   . Dysphagia, oropharyngeal phase   . Essential (primary) hypertension   . Hyperosmolality and/or hypernatremia   . Hypovolemic shock ([place])   . Iron deficiency anemia   . LBBB (left bundle branch block)   . Malignant neoplasm of upper lobe bronchus, left ([place])   . Metabolic encephalopathy   . MSSA bacteremia 2021  . Muscle weakness (generalized)   . Peritoneal abscess ([place])   . Pneumonia   . RA (rheumatoid arthritis) ([place])   . Respiratory failure ([place])   . Staph infection   . UGIB (upper gastrointestinal bleed) 2021  . Unspecified protein-calorie malnutrition ([place])    Past Surgical History:  Past Surgical History:  Procedure Laterality Date  . ABCESS DRAINAGE Bilateral 2021  . ABDOMINAL HYSTERECTOMY    . BREAST ENHANCEMENT SURGERY    . CERVICAL FUSION    . COLONOSCOPY     about 5 years ago. Been doing cologuard  . ESOPHAGOGASTRODUODENOSCOPY     [place]   . EYE SURGERY Bilateral    lens replacement  . INCISION AND DRAINAGE OF WOUND Bilateral 2021   hip, thigh  . IR THORACENTESIS ASP PLEURAL SPACE W/IMG GUIDE  06/20/2019  . LUMBAR FUSION    . TONSILLECTOMY     HPI:  74 y.o. female with medical history significant for rheumatoid arthritis previously on Humira, chronic pain, and complicated hospitalization in [place] beginning in late January and involving MSSA bacteremia, now presenting to the ED for evaluation of  concerning CT chest findings.  Patient had CT chest performed today with large bilateral pleural effusions, complete collapse of left lower lobe, partial collapse of the lingula, and spiculated nodule in the right upper lobe.  CXR on 06/21/19 indicated Small to moderate left pleural effusion increased compared prior exam. Moderate right pleural effusion is unchanged.  Assessment / Plan / Recommendation Clinical Impression  Pt with a normal oropharyngeal swallow without overt s/s of aspiration present during trial with thin, puree and soft solids.  Pt stated she was on a Dysphagia 3 diet with thin liquids in previous facility, but unsure why this was put into place.  Pt had a recent lengthy hospitalization, so safety may be a concern due to generalized weakness and compromised respiratory issues.  Pt swallowed timely and cleared all stasis using general swallowing precautions.  Continue current diet of Dysphagia 3 (mechanical soft) with thin liquids at this time.  ST will f/u x1 for diet tolerance during a meal for potential fatigue/safety concerns. Thank you for this consult.      Aspiration Risk    Mild   Diet Recommendation   Dysphagia 3/thin liquids  Medication Administration: Crushed with puree    Other  Recommendations Oral Care Recommendations: Oral care BID   Follow up Recommendations Skilled Nursing facility      Frequency and Duration     1x/wk for 1 week  Prognosis Prognosis for Safe Diet Advancement: Good      Swallow Study   General Date of Onset: 06/19/19 HPI: 75 y.o. female with medical history significant for rheumatoid arthritis previously on Humira, chronic pain, and complicated hospitalization in [place] beginning in late January and involving MSSA bacteremia, now presenting to the ED for evaluation of concerning CT chest findings.  Patient had CT chest performed today with large bilateral pleural effusions, complete collapse of left lower lobe,  partial collapse of the lingula, and spiculated nodule in the right upper lobe.  Type of Study: Bedside Swallow Evaluation Previous Swallow Assessment: hx of dysphagia in chart review; stated she was on D3/thin at rehab faciility Diet Prior to this Study: Dysphagia 3 (soft);Thin liquids Temperature Spikes Noted: No Respiratory Status: Nasal cannula History of Recent Intubation: No Behavior/Cognition: Alert;Cooperative;Pleasant mood Oral Cavity Assessment: Within Functional Limits Oral Care Completed by SLP: Recent completion by staff Oral Cavity - Dentition: Adequate natural dentition;Poor condition Vision: Functional for self-feeding Self-Feeding Abilities: Able to feed self Patient Positioning: Upright in bed Baseline Vocal Quality: Normal Volitional Cough: Strong Volitional Swallow: Able to elicit    Oral/Motor/Sensory Function Overall Oral Motor/Sensory Function: Within functional limits   Ice Chips Ice chips: Not tested   Thin Liquid Thin Liquid: Within functional limits Presentation: Cup    Nectar Thick Nectar Thick Liquid: Not tested   Honey Thick Honey Thick Liquid: Not tested   Puree Puree: Within functional limits Presentation: Self Fed   Solid     Solid: Within functional limits Presentation: Self Fed      Elvina Sidle, M.S., CCC-SLP 06/21/2019,11:45 AM

## 2019-06-22 LAB — CBC WITH DIFFERENTIAL/PLATELET
Abs Immature Granulocytes: 0.02 10*3/uL (ref 0.00–0.07)
Basophils Absolute: 0.1 10*3/uL (ref 0.0–0.1)
Basophils Relative: 1 %
Eosinophils Absolute: 0.4 10*3/uL (ref 0.0–0.5)
Eosinophils Relative: 5 %
HCT: 28.4 % — ABNORMAL LOW (ref 36.0–46.0)
Hemoglobin: 8.8 g/dL — ABNORMAL LOW (ref 12.0–15.0)
Immature Granulocytes: 0 %
Lymphocytes Relative: 37 %
Lymphs Abs: 3 10*3/uL (ref 0.7–4.0)
MCH: 32 pg (ref 26.0–34.0)
MCHC: 31 g/dL (ref 30.0–36.0)
MCV: 103.3 fL — ABNORMAL HIGH (ref 80.0–100.0)
Monocytes Absolute: 0.8 10*3/uL (ref 0.1–1.0)
Monocytes Relative: 9 %
Neutro Abs: 4 10*3/uL (ref 1.7–7.7)
Neutrophils Relative %: 48 %
Platelets: 316 10*3/uL (ref 150–400)
RBC: 2.75 MIL/uL — ABNORMAL LOW (ref 3.87–5.11)
RDW: 16.7 % — ABNORMAL HIGH (ref 11.5–15.5)
WBC: 8.3 10*3/uL (ref 4.0–10.5)
nRBC: 0 % (ref 0.0–0.2)

## 2019-06-22 LAB — COMPREHENSIVE METABOLIC PANEL
ALT: <5 U/L (ref 0–44)
AST: 22 U/L (ref 15–41)
Albumin: 2.1 g/dL — ABNORMAL LOW (ref 3.5–5.0)
Alkaline Phosphatase: 144 U/L — ABNORMAL HIGH (ref 38–126)
Anion gap: 9 (ref 5–15)
BUN: 16 mg/dL (ref 8–23)
CO2: 33 mmol/L — ABNORMAL HIGH (ref 22–32)
Calcium: 8 mg/dL — ABNORMAL LOW (ref 8.9–10.3)
Chloride: 94 mmol/L — ABNORMAL LOW (ref 98–111)
Creatinine, Ser: 0.7 mg/dL (ref 0.44–1.00)
GFR calc Af Amer: >60 mL/min (ref >60)
GFR calc non Af Amer: >60 mL/min (ref >60)
Glucose, Bld: 98 mg/dL (ref 70–99)
Potassium: 3 mmol/L — ABNORMAL LOW (ref 3.5–5.1)
Sodium: 136 mmol/L (ref 135–145)
Total Bilirubin: 0.7 mg/dL (ref 0.3–1.2)
Total Protein: 5.7 g/dL — ABNORMAL LOW (ref 6.5–8.1)

## 2019-06-22 LAB — T4, FREE: Free T4: 0.93 ng/dL (ref 0.61–1.12)

## 2019-06-22 LAB — MAGNESIUM: Magnesium: 1.6 mg/dL — ABNORMAL LOW (ref 1.7–2.4)

## 2019-06-22 LAB — TSH: TSH: 10.903 u[IU]/mL — ABNORMAL HIGH (ref 0.350–4.500)

## 2019-06-22 LAB — PHOSPHORUS: Phosphorus: 3.5 mg/dL (ref 2.5–4.6)

## 2019-06-22 MED ORDER — POTASSIUM CHLORIDE CRYS ER 20 MEQ PO TBCR
40.0000 meq | EXTENDED_RELEASE_TABLET | Freq: Two times a day (BID) | ORAL | Status: AC
  Administered 2019-06-22 (×2): 40 meq via ORAL
  Filled 2019-06-22 (×2): qty 2

## 2019-06-22 MED ORDER — MAGNESIUM SULFATE 2 GM/50ML IV SOLN
2.0000 g | Freq: Once | INTRAVENOUS | Status: AC
  Administered 2019-06-22: 09:00:00 2 g via INTRAVENOUS
  Filled 2019-06-22: qty 50

## 2019-06-22 NOTE — Progress Notes (Signed)
Progress Note  Patient Name: Samantha Keller Date of Encounter: 06/22/2019  Primary Cardiologist: New  Subjective   Some ongoing SOB  Inpatient Medications    Scheduled Meds: . Chlorhexidine Gluconate Cloth  6 each Topical Daily  . cycloSPORINE  1 drop Both Eyes BID  . doxycycline  100 mg Oral BID  . DULoxetine  60 mg Oral Daily  . enoxaparin (LOVENOX) injection  40 mg Subcutaneous Q24H  . feeding supplement (ENSURE ENLIVE)  237 mL Oral BID BM  . folic acid  2 mg Oral Daily  . furosemide  60 mg Intravenous Q12H  . gabapentin  600 mg Oral TID  . losartan  12.5 mg Oral Daily  . melatonin  6 mg Oral QHS  . metoprolol tartrate  25 mg Oral BID  . multivitamin with minerals  1 tablet Oral Daily  . pantoprazole  40 mg Oral BID  . potassium chloride  40 mEq Oral BID  . sodium chloride flush  3 mL Intravenous Q12H  . sodium chloride flush  3 mL Intravenous Q12H   Continuous Infusions: . sodium chloride    .  ceFAZolin (ANCEF) IV 2 g (06/22/19 0603)   PRN Meds: sodium chloride, acetaminophen **OR** acetaminophen, baclofen, lidocaine, polyethylene glycol, sodium chloride flush   Vital Signs    Vitals:   06/21/19 1600 06/21/19 2033 06/22/19 0540 06/22/19 0847  BP: 122/70 (!) 119/59 (!) 111/58 (!) 138/58  Pulse: 99 97 85 93  Resp: 15 13 11 18   Temp: 98 F (36.7 C) 97.9 F (36.6 C) 97.6 F (36.4 C) 97.9 F (36.6 C)  TempSrc: Oral Oral Oral Oral  SpO2: 100% 100% 100% 99%  Weight:      Height:        Intake/Output Summary (Last 24 hours) at 06/22/2019 1219 Last data filed at 06/22/2019 0908 Gross per 24 hour  Intake 760 ml  Output 3400 ml  Net -2640 ml   Last 3 Weights 06/20/2019 06/11/2019  Weight (lbs) 160 lb 7.9 oz 165 lb 3 oz  Weight (kg) 72.8 kg 74.929 kg      Telemetry    SR - Personally Reviewed  ECG    n/a - Personally Reviewed  Physical Exam   GEN: No acute distress.   Neck: elevated JVD Cardiac: RRR, no murmurs, rubs, or gallops.  Respiratory:  coarse bilaterally GI: Soft, nontender, non-distended  MS: 2+ bilateral LE edema; No deformity. Neuro:  Nonfocal  Psych: Normal affect   Labs    High Sensitivity Troponin:   Recent Labs  Lab 06/19/19 1935  TROPONINIHS 12      Chemistry Recent Labs  Lab 06/20/19 0513 06/21/19 0422 06/22/19 0449  NA 138 136 136  K 3.6 3.4* 3.0*  CL 100 99 94*  CO2 29 29 33*  GLUCOSE 135* 95 98  BUN 13 13 16   CREATININE 0.77 0.73 0.70  CALCIUM 8.3* 8.1* 8.0*  PROT 6.0* 5.9* 5.7*  ALBUMIN 1.8* 2.2* 2.1*  AST 21 39 22  ALT <5 <5 <5  ALKPHOS 131* 154* 144*  BILITOT 0.6 0.9 0.7  GFRNONAA >60 >60 >60  GFRAA >60 >60 >60  ANIONGAP 9 8 9      Hematology Recent Labs  Lab 06/20/19 0513 06/21/19 0422 06/22/19 0449  WBC 10.1 8.0 8.3  RBC 2.77* 2.58* 2.75*  HGB 8.7* 8.3* 8.8*  HCT 28.4* 27.2* 28.4*  MCV 102.5* 105.4* 103.3*  MCH 31.4 32.2 32.0  MCHC 30.6 30.5 31.0  RDW 17.1*  17.2* 16.7*  PLT 340 311 316    BNP Recent Labs  Lab 06/19/19 1940  BNP 1,843.9*     DDimer No results for input(s): DDIMER in the last 168 hours.   Radiology    DG CHEST PORT 1 VIEW  Result Date: 06/21/2019 CLINICAL DATA:  Shortness of breath and pleural effusion EXAM: PORTABLE CHEST 1 VIEW COMPARISON:  June 20, 2019 FINDINGS: Right PICC line is identified unchanged. The mediastinal contour and cardiac silhouette are stable. Small to moderate left pleural effusion is increased compared prior exam. Moderate right pleural effusion is unchanged. Scoliosis with prior postsurgical change of cervical spine are noted unchanged. IMPRESSION: Small to moderate left pleural effusion increased compared prior exam. Moderate right pleural effusion is unchanged. Electronically Signed   By: Abelardo Diesel M.D.   On: 06/21/2019 11:19   ECHOCARDIOGRAM COMPLETE  Result Date: 06/20/2019    ECHOCARDIOGRAM REPORT   Patient Name:   Samantha Keller Birch Date of Exam: 06/20/2019 Medical Rec #:  [id number]      Height:       62.0 in  Accession #:    [id number]     Weight:       160.5 lb Date of Birth:  [birth year]       BSA:          1.741 m Patient Age:    74 years       BP:           112/63 mmHg Patient Gender: F              HR:           90 bpm. Exam Location:  Inpatient Procedure: 2D Echo Indications:    Dyspnea R06.00  History:        Patient has no prior history of Echocardiogram examinations.  Sonographer:    Mikki Santee RDCS (AE) Referring Phys: [id number] Georgina Quint LATIF [place]  1. Left ventricular ejection fraction, by estimation, is 20 to 25%. The left ventricle has severely decreased function. The left ventricle demonstrates global hypokinesis. Indeterminate diastolic filling due to E-A fusion.  2. Right ventricular systolic function is mildly reduced. The right ventricular size is moderately enlarged. There is moderately elevated pulmonary artery systolic pressure. The estimated right ventricular systolic pressure is 73.4 mmHg.  3. Right atrial size was mild to moderately dilated.  4. The mitral valve is grossly normal. Mild to moderate mitral valve regurgitation.  5. Tricuspid valve regurgitation is moderate.  6. The aortic valve is tricuspid. Aortic valve regurgitation is not visualized. No aortic stenosis is present.  7. The inferior vena cava is dilated in size with <50% respiratory variability, suggesting right atrial pressure of 15 mmHg. FINDINGS  Left Ventricle: Left ventricular ejection fraction, by estimation, is 20 to 25%. The left ventricle has severely decreased function. The left ventricle demonstrates global hypokinesis. The left ventricular internal cavity size was normal in size. There is no left ventricular hypertrophy. Abnormal (paradoxical) septal motion, consistent with left bundle Eythan Jayne block. Indeterminate diastolic filling due to E-A fusion. Right Ventricle: The right ventricular size is moderately enlarged. No increase in right ventricular wall thickness. Right ventricular systolic function is  mildly reduced. There is moderately elevated pulmonary artery systolic pressure. The tricuspid regurgitant velocity is 3.08 m/s, and with an assumed right atrial pressure of 15 mmHg, the estimated right ventricular systolic pressure is 19.3 mmHg. Left Atrium: Left atrial size was normal in size. Right Atrium: Right atrial size was  mild to moderately dilated. Pericardium: There is no evidence of pericardial effusion. Presence of pericardial fat pad. Mitral Valve: The mitral valve is grossly normal. Mild to moderate mitral valve regurgitation. Tricuspid Valve: The tricuspid valve is grossly normal. Tricuspid valve regurgitation is moderate. Aortic Valve: The aortic valve is tricuspid. Aortic valve regurgitation is not visualized. No aortic stenosis is present. Pulmonic Valve: The pulmonic valve was grossly normal. Pulmonic valve regurgitation is not visualized. No evidence of pulmonic stenosis. Aorta: The aortic root and ascending aorta are structurally normal, with no evidence of dilitation. Venous: The inferior vena cava is dilated in size with less than 50% respiratory variability, suggesting right atrial pressure of 15 mmHg. IAS/Shunts: The atrial septum is grossly normal. Additional Comments: A venous catheter is visualized in the right atrium. There is a small pleural effusion in the left lateral region.  LEFT VENTRICLE PLAX 2D LVIDd:         4.50 cm     Diastology LVIDs:         3.60 cm     LV e' lateral:   6.30 cm/s LV PW:         1.00 cm     LV E/e' lateral: 12.9 LV IVS:        0.80 cm     LV e' medial:    4.06 cm/s LVOT diam:     1.90 cm     LV E/e' medial:  20.0 LV SV:         31 LV SV Index:   18 LVOT Area:     2.84 cm  LV Volumes (MOD) LV vol d, MOD A2C: 79.9 ml LV vol d, MOD A4C: 72.4 ml LV vol s, MOD A2C: 51.7 ml LV vol s, MOD A4C: 45.7 ml LV SV MOD A2C:     28.2 ml LV SV MOD A4C:     72.4 ml LV SV MOD BP:      29.5 ml RIGHT VENTRICLE RV S prime:     8.70 cm/s TAPSE (M-mode): 1.2 cm LEFT ATRIUM              Index       RIGHT ATRIUM           Index LA diam:        3.50 cm 2.01 cm/m  RA Area:     16.90 cm LA Vol (A2C):   36.2 ml 20.79 ml/m RA Volume:   50.10 ml  28.78 ml/m LA Vol (A4C):   41.3 ml 23.72 ml/m LA Biplane Vol: 38.8 ml 22.29 ml/m  AORTIC VALVE LVOT Vmax:   70.00 cm/s LVOT Vmean:  44.400 cm/s LVOT VTI:    0.108 m  AORTA Ao Root diam: 2.30 cm Ao Asc diam:  3.00 cm MITRAL VALVE               TRICUSPID VALVE MV Area (PHT): 3.99 cm    TR Peak grad:   37.9 mmHg MV Decel Time: 190 msec    TR Vmax:        308.00 cm/s MR Peak grad: 33.6 mmHg MR Vmax:      290.00 cm/s  SHUNTS MV E velocity: 81.00 cm/s  Systemic VTI:  0.11 m MV A velocity: 72.20 cm/s  Systemic Diam: 1.90 cm MV E/A ratio:  1.12 Eleonore Chiquito MD Electronically signed by Eleonore Chiquito MD Signature Date/Time: 06/20/2019/4:09:00 PM    Final    VAS [place] LOWER EXTREMITY VENOUS (DVT)  Result  Date: 06/21/2019  Lower Venous DVTStudy Indications: Edema.  Comparison Study: no prior study Performing Technologist: Sharion Dove RVS  Examination Guidelines: A complete evaluation includes B-mode imaging, spectral Doppler, color Doppler, and power Doppler as needed of all accessible portions of each vessel. Bilateral testing is considered an integral part of a complete examination. Limited examinations for reoccurring indications may be performed as noted. The reflux portion of the exam is performed with the patient in reverse Trendelenburg.  +---------+---------------+---------+-----------+----------+--------------+ RIGHT    CompressibilityPhasicitySpontaneityPropertiesThrombus Aging +---------+---------------+---------+-----------+----------+--------------+ CFV      Full                                         pulsatile      +---------+---------------+---------+-----------+----------+--------------+ SFJ      Full                                                         +---------+---------------+---------+-----------+----------+--------------+ FV Prox  Full                                                        +---------+---------------+---------+-----------+----------+--------------+ FV Mid   Full                                                        +---------+---------------+---------+-----------+----------+--------------+ FV DistalFull                                                        +---------+---------------+---------+-----------+----------+--------------+ PFV      Full                                                        +---------+---------------+---------+-----------+----------+--------------+ POP      Full                                         pulsatile      +---------+---------------+---------+-----------+----------+--------------+ PTV      Full                                                        +---------+---------------+---------+-----------+----------+--------------+ PERO     Full                                                        +---------+---------------+---------+-----------+----------+--------------+   +---------+---------------+---------+-----------+----------+--------------+  LEFT     CompressibilityPhasicitySpontaneityPropertiesThrombus Aging +---------+---------------+---------+-----------+----------+--------------+ CFV      Full                                         pulsatile      +---------+---------------+---------+-----------+----------+--------------+ SFJ      Full                                                        +---------+---------------+---------+-----------+----------+--------------+ FV Prox  Full                                                        +---------+---------------+---------+-----------+----------+--------------+ FV Mid   Full                                                         +---------+---------------+---------+-----------+----------+--------------+ FV DistalFull                                                        +---------+---------------+---------+-----------+----------+--------------+ PFV      Full                                                        +---------+---------------+---------+-----------+----------+--------------+ POP      Full                                         pulsatile      +---------+---------------+---------+-----------+----------+--------------+ PTV      Full                                                        +---------+---------------+---------+-----------+----------+--------------+ PERO     Full                                                        +---------+---------------+---------+-----------+----------+--------------+     Summary: BILATERAL: - No evidence of deep vein thrombosis seen in the lower extremities, bilaterally.   *See table(s) above for measurements and observations.    Preliminary     Cardiac Studies     Patient Profile     MEKAYLA SOMAN is a  74 y.o. female with a hx of rheumatoid arthritis, recent extended admission in [facility] with MSSA bacteremia,  who is being seen today for the evaluation of acute HF at the request of Dr . Alfredia Ferguson   Assessment & Plan    1. Acute systolic HF - - 09/2900 echo LVEF 20-25%, mild RV dysfunction, PASP 53 - - BNP 1800 on admission. Massively fluid overloaded by exam  - negative 3.2 L yesterday, neg 5.5 L since admission. She is on lasix 60mg  IV bid. REnal function is stable. REmains massively fluid overloaded with anasarca, conitue IV diuretics.  - hold beta blocker until more euvolemic - started losartan 12.5mg  daily yesterday, bp's tolerating. Titrate as tolerated, likely add low dose aldactone pending lab trends - - history of prior GI bleed with progressing anemia this admission, no plans for inpatient cath. Also lung nodule and recent  issues with MSSA bacteremia on abx. Quite a few things to resolve prior to cath, no urgent indications and could be done as outpatient down the road.    2. Hypokalemia/Hypomagnesemia - K 3 this AM, Mg 1.6 - she is already written for oral replacement for KCl and IV replacement for Mg   3. High TSH/normal Free T4 - per primary team   For questions or updates, please contact [place] Please consult [email or web address] for contact info under        Signed, Carlyle Dolly, MD  06/22/2019, 12:19 PM

## 2019-06-22 NOTE — Progress Notes (Signed)
PROGRESS NOTE    Samantha Keller  DZH:[id number] DOB: [birth year] DOA: 06/19/2019 PCP: Patient, [name]  Brief Narrative:  HPI per Dr. Christia Reading Opyd 06/19/19 Samantha Keller is a 74 y.o. female with medical history significant for rheumatoid arthritis previously on Humira, chronic pain, and complicated hospitalization in [place] beginning in late January and involving MSSA bacteremia, now presenting to the ED for evaluation of concerning CT chest findings.  Patient had CT chest performed today with large bilateral pleural effusions, complete collapse of left lower lobe, partial collapse of the lingula, and spiculated nodule in the right upper lobe.  She was directed to the ED for evaluation of this.  Patient reports chronic dyspnea and cough but has not appreciated any acute change and denies any recent fevers, chills, chest pain, or palpitations.  Patient had a complicated hospitalization beginning in late January when she presented with fevers, confusion, and hypoxia, and was found to be hyponatremic with AKI, right upper lobe mass concerning for pneumonia versus cancer, MSSA bacteremia, lower extremity cellulitis with abscess, and upper GI bleed.  She had TEE negative for vegetation, had vegetation at the tip of her central line, underwent I&D abscess, and was eventually discharged to a local SNF with PICC for long course of cefazolin.  She has since established with local GI, pulmonology, and ID, and had an outpatient CT chest performed today with findings that prompted her current presentation.  ED Course: Upon arrival to the ED, patient is found to be afebrile, saturating 100% on 2 L/min of supplemental oxygen, slightly tachycardic, and with stable blood pressure.  EKG features sinus rhythm with LVH, secondary repolarization abnormality, and QTc interval 501 ms.  Chemistry panel notable for albumin of 1.8.  CBC with hemoglobin of 9.7 and MCV 103.3.  BNP is elevated to 1844.   Troponin and INR are normal.  Infectious disease was consulted by the ED physician, influenza and Covid PCR are in process, and hospitalist asked to admit.  **Interim History  Patient continues to be significantly volume overloaded and states that her shortness of breath is little bit better but still has some.  We will further work-up her anasarca and given that she had an elevated BNP will order echocardiogram and lower extremity venous duplex to rule out DVT.  Echocardiogram showed an EF of 20 to 25% and from review of outside records from the hospital she had an EF of 50 to 55% in January.  Cardiology was consulted for her new systolic CHF and they recommend against cath at this time and recommending medical management and diuresis and they have started her on losartan.  LE Venous duplex was negative for DVT.  Assessment & Plan:   Principal Problem:   Large pleural effusion Active Problems:   Bacteremia due to methicillin susceptible Staphylococcus aureus (MSSA)   History of GI bleed   Rheumatoid arthritis (HCC)   Anemia   Nodule of right lung   Anasarca   Protein-calorie malnutrition, mild (HCC)   Pleural effusion  Large pleural effusions; right lung nodule; chronic hypoxic respiratory failure in the setting of acute systolic CHF with a EF of 20-25% - Patient has been on cefazolin at her SNF for MSSA bacteremia secondary to LE cellulitis vs PNA and had follow-up chest CT today,  -Was found to have large bilateral pleural effusions with complete LLL collapse, partial collapse of lingula, and spiculated RUL nodule  - She is stable on her usual 2 Lpm and denies  any recent change in chronic cough and dyspnea  - Consult IR for thoracentesis with fluid analysis including cytology and this was done yesterday  -Monocyte-macrocytosis fluid was 18 and straw-colored fluid movements 11 650 mL was removed  -Repeat chest x-ray 06/21/19 showed "Small to moderate left pleural effusion increased compared  prior exam. Moderate right pleural effusion is unchanged." -Echocardiogram to rule out Cardiac Causes and showed "Left ventricular ejection fraction, by estimation, is 20 to 25%. The Left ventricle has severely decreased function. The left ventricle  demonstrates global hypokinesis. Indeterminate diastolic filling due to E-A fusion.  Right ventricular systolic function is mildly reduced. The right  ventricular size is moderately enlarged. There is moderately elevated  pulmonary artery systolic pressure. The estimated right ventricular  systolic pressure is 81.8 mmHg. Right atrial size was mild to moderately dilated." -Gven albumin and Lasix combination x1 the day before yesterdayand started Scheduled Diuresis with IV Lasix 40 mg q12h and Cardiology was consulted and increased this to 60 mg IV BID -Strict I's and O's and daily weights; She is -7.707 Liters since admission and Weight is normally 115 and she is 160.5 on admission and not rechecked today despite having Daily Weights -BP was elevated on admission at 1843 and does have CHF -Repeat chest x-ray in a.m.   MSSA bacteremia   - Admitted to a hospital in [place] in late January with MSSA bacteremia, suspected PNA, and LE cellulitis with abscess  - She had TEE without vegetation; there was vegetation at tip of central line  - She has been on cefazolin with end date 06/25/2019; she is also on doxycycline  - ID consulted by ED physician and much appreciate  - No fever or leukocytosis on admission  - Continue cefazolin and doxy, repeat blood cultures  and showed no growth to date less than 12 hours -Infectious disease recommends continuing current treatment with Cefazolin and po Doxy  -She underwent a thoracentesis yesterday  -Repeat chest x-ray intermittently and in AM    Anemia; recent UGIB  -Hgb/Hct went from 9.7/31.5 -> 8.7/20.4 -> 8.3/27.2 -> 8.8/28.4 -She is established with local GI and planned for EGD in April  -Continue Protonix    -Need to monitor for signs and symptoms of bleeding; currently no overt bleeding noted -Repeat CBC in a.m.  Rheumatoid arthritis; chronic pain  - Continue Cymbalta, gabapentin, as-needed baclofen    Anasarca and volume overload likely multifactorial in the setting of poor nutritional status and acute systolic CHF with an EF of 20 to 25% -In the setting of poor nutritional status and low albumin 1.8 and prealbumin 9.8 and now acute systolic CHF -Consult nutritionist and they recommended a speech eval because the patient was on pured diet; SLP has been called the diet will remain on mechanical soft with thin liquids -Give albumin 25 g as well as IV Lasix 40 mg and see volume response yesterday but will start scheduled diuresis with IV 40 mg of Lasix and cardiology increase this to IV 60 mg BID  -Check echocardiogram and lower extremity venous duplex to rule out DVT -BNP was elevated so echocardiogram was done and showed findings as below -Continue with Ensure Enlive p.o. twice daily as well as a multivitamin daily -Unlikely the patient's anasarca is related to nephrotic syndrome given the lack of proteinuria on her urinalysis -Check LE Venous Duplex to r/o DVT and this was negative -Cardiology was consulted for further evaluation recommendations and appreciate their assistance and Dr. Harl Bowie feels that she  remains massively volume overloaded and recommends to continue to hold beta-blocker until more euvolemic and start losartan 12.5 mg p.o. daily -Dr. Harl Bowie feels that she will require several days of IV diuresis and will continue Diuresis per Card Recc's   Hypomagnesemia -Patient's magnesium level was 1.6 -Replete with IV mag sulfate 2 g -Continue to monitor and replete as necessary -Repeat magnesium level in the a.m.  Hypokalemia -Patient's potassium was 3.0 -Likely in the setting of IV diuresis  -Replete with po KCl 40 mEQ BID x2  -likely will require scheduled potassium replacement   -continue to monitor and replete as necessary -Repeat CMP in AM   DVT prophylaxis: Enoxaparin 40 mg sq q24h Code Status: FULL CODE  Family Communication: Daughter was not at bedside today  Disposition Plan: The patient is from SNF and likely will go back to SNF once her volume status improves and she is diuresed back to her dry weight per cardiology recommendations  Consultants:   Infectious Diseases    Procedures:  ECHOCARDIOGRAM IMPRESSIONS    1. Left ventricular ejection fraction, by estimation, is 20 to 25%. The  left ventricle has severely decreased function. The left ventricle  demonstrates global hypokinesis. Indeterminate diastolic filling due to  E-A fusion.  2. Right ventricular systolic function is mildly reduced. The right  ventricular size is moderately enlarged. There is moderately elevated  pulmonary artery systolic pressure. The estimated right ventricular  systolic pressure is 19.6 mmHg.  3. Right atrial size was mild to moderately dilated.  4. The mitral valve is grossly normal. Mild to moderate mitral valve  regurgitation.  5. Tricuspid valve regurgitation is moderate.  6. The aortic valve is tricuspid. Aortic valve regurgitation is not  visualized. No aortic stenosis is present.  7. The inferior vena cava is dilated in size with <50% respiratory  variability, suggesting right atrial pressure of 15 mmHg.   FINDINGS  Left Ventricle: Left ventricular ejection fraction, by estimation, is 20  to 25%. The left ventricle has severely decreased function. The left  ventricle demonstrates global hypokinesis. The left ventricular internal  cavity size was normal in size. There  is no left ventricular hypertrophy. Abnormal (paradoxical) septal motion,  consistent with left bundle branch block. Indeterminate diastolic filling  due to E-A fusion.   Right Ventricle: The right ventricular size is moderately enlarged. No  increase in right ventricular wall  thickness. Right ventricular systolic  function is mildly reduced. There is moderately elevated pulmonary artery  systolic pressure. The tricuspid  regurgitant velocity is 3.08 m/s, and with an assumed right atrial  pressure of 15 mmHg, the estimated right ventricular systolic pressure is  22.2 mmHg.   Left Atrium: Left atrial size was normal in size.   Right Atrium: Right atrial size was mild to moderately dilated.   Pericardium: There is no evidence of pericardial effusion. Presence of  pericardial fat pad.   Mitral Valve: The mitral valve is grossly normal. Mild to moderate mitral  valve regurgitation.   Tricuspid Valve: The tricuspid valve is grossly normal. Tricuspid valve  regurgitation is moderate.   Aortic Valve: The aortic valve is tricuspid. Aortic valve regurgitation is  not visualized. No aortic stenosis is present.   Pulmonic Valve: The pulmonic valve was grossly normal. Pulmonic valve  regurgitation is not visualized. No evidence of pulmonic stenosis.   Aorta: The aortic root and ascending aorta are structurally normal, with  no evidence of dilitation.   Venous: The inferior vena cava is dilated in  size with less than 50%  respiratory variability, suggesting right atrial pressure of 15 mmHg.   IAS/Shunts: The atrial septum is grossly normal.   Additional Comments: A venous catheter is visualized in the right atrium.  There is a small pleural effusion in the left lateral region.     LEFT VENTRICLE  PLAX 2D  LVIDd:     4.50 cm   Diastology  LVIDs:     3.60 cm   LV e' lateral:  6.30 cm/s  LV PW:     1.00 cm   LV E/e' lateral: 12.9  LV IVS:    0.80 cm   LV e' medial:  4.06 cm/s  LVOT diam:   1.90 cm   LV E/e' medial: 20.0  LV SV:     31  LV SV Index:  18  LVOT Area:   2.84 cm    LV Volumes (MOD)  LV vol d, MOD A2C: 79.9 ml  LV vol d, MOD A4C: 72.4 ml  LV vol s, MOD A2C: 51.7 ml  LV vol s, MOD A4C: 45.7 ml  LV SV  MOD A2C:   28.2 ml  LV SV MOD A4C:   72.4 ml  LV SV MOD BP:   29.5 ml   RIGHT VENTRICLE  RV S prime:   8.70 cm/s  TAPSE (M-mode): 1.2 cm   LEFT ATRIUM       Index    RIGHT ATRIUM      Index  LA diam:    3.50 cm 2.01 cm/m RA Area:   16.90 cm  LA Vol (A2C):  36.2 ml 20.79 ml/m RA Volume:  50.10 ml 28.78 ml/m  LA Vol (A4C):  41.3 ml 23.72 ml/m  LA Biplane Vol: 38.8 ml 22.29 ml/m  AORTIC VALVE  LVOT Vmax:  70.00 cm/s  LVOT Vmean: 44.400 cm/s  LVOT VTI:  0.108 m    AORTA  Ao Root diam: 2.30 cm  Ao Asc diam: 3.00 cm   MITRAL VALVE        TRICUSPID VALVE  MV Area (PHT): 3.99 cm  TR Peak grad:  37.9 mmHg  MV Decel Time: 190 msec  TR Vmax:    308.00 cm/s  MR Peak grad: 33.6 mmHg  MR Vmax:   290.00 cm/s SHUNTS  MV E velocity: 81.00 cm/s Systemic VTI: 0.11 m  MV A velocity: 72.20 cm/s Systemic Diam: 1.90 cm  MV E/A ratio: 1.12   LE VENOUS DUPLEX BILATERAL:  - No evidence of deep vein thrombosis seen in the lower extremities,  bilaterally.   Antimicrobials:  Anti-infectives (From admission, onward)   Start     Dose/Rate Route Frequency Ordered Stop   06/19/19 2315  doxycycline (VIBRA-TABS) tablet 100 mg     100 mg Oral 2 times daily 06/19/19 2304     06/19/19 2315  ceFAZolin (ANCEF) IVPB 2g/100 mL premix     2 g 200 mL/hr over 30 Minutes Intravenous Every 8 hours 06/19/19 2306       Subjective: Seen and examined at bedside states that she slept okay and still has some shortness of breath.  No nausea or vomiting.  Denies any other concerns or complaints at this time.  Objective: Vitals:   06/21/19 2033 06/22/19 0540 06/22/19 0847 06/22/19 1358  BP: (!) 119/59 (!) 111/58 (!) 138/58 (!) 101/52  Pulse: 97 85 93 90  Resp: 13 11 18 15   Temp: 97.9 F (36.6 C) 97.6 F (36.4 C) 97.9 F (36.6 C)  98.4 F (36.9 C)  TempSrc: Oral Oral Oral Oral  SpO2: 100% 100% 99% 100%  Weight:      Height:         Intake/Output Summary (Last 24 hours) at 06/22/2019 1413 Last data filed at 06/22/2019 0908 Gross per 24 hour  Intake 560 ml  Output 2400 ml  Net -1840 ml   Filed Weights   06/20/19 0150  Weight: 72.8 kg   Examination: Physical Exam:  Constitutional: WN/WD overweight Caucasian female currently no acute distress appears calm Eyes: Lids and conjunctivae normal, sclerae anicteric  ENMT: External Ears, Nose appear normal. Grossly normal hearing. Mucous membranes are moist. Neck: Appears normal, supple, no cervical masses, normal ROM, no appreciable thyromegaly; no real JVD Respiratory: Diminished to auscultation bilaterally with coarse breath sounds and some crackles; no wheezing, rales, rhonchi. Normal respiratory effort and patient is not tachypenic. No accessory muscle use.  Wearing supplemental oxygen via nasal cannula 2 L Cardiovascular: RRR, no murmurs / rubs / gallops. S1 and S2 auscultated.  Has 2+ to 3+ lower extremity pitting edema still Abdomen: Soft, non-tender, distended secondary body habitus. Bowel sounds positive.  GU: Deferred. Musculoskeletal: No clubbing / cyanosis of digits/nails. No joint deformity upper and lower extremities.  Skin: No rashes, lesions, ulcers on limited skin evaluation. No induration; Warm and dry.  Neurologic: CN 2-12 grossly intact with no focal deficits. Romberg sign and cerebellar reflexes not assessed.  Psychiatric: Normal judgment and insight. Alert and oriented x 3. Normal mood and appropriate affect.   Data Reviewed: I have personally reviewed following labs and imaging studies  CBC: Recent Labs  Lab 06/19/19 1935 06/20/19 0513 06/21/19 0422 06/22/19 0449  WBC 6.3 10.1 8.0 8.3  NEUTROABS 3.8 6.0 3.8 4.0  HGB 9.7* 8.7* 8.3* 8.8*  HCT 31.5* 28.4* 27.2* 28.4*  MCV 103.3* 102.5* 105.4* 103.3*  PLT 360 340 311 741   Basic Metabolic Panel: Recent Labs  Lab 06/19/19 1935 06/20/19 0513 06/21/19 0422 06/22/19 0449  NA 136 138 136  136  K 3.8 3.6 3.4* 3.0*  CL 99 100 99 94*  CO2 27 29 29  33*  GLUCOSE 163* 135* 95 98  BUN 10 13 13 16   CREATININE 0.68 0.77 0.73 0.70  CALCIUM 8.1* 8.3* 8.1* 8.0*  MG  --   --  1.6* 1.6*  PHOS  --   --  3.7 3.5   GFR: Estimated Creatinine Clearance: 59.4 mL/min (by C-G formula based on SCr of 0.7 mg/dL). Liver Function Tests: Recent Labs  Lab 06/19/19 1935 06/20/19 0513 06/21/19 0422 06/22/19 0449  AST 25 21 39 22  ALT 6 <5 <5 <5  ALKPHOS 145* 131* 154* 144*  BILITOT 0.8 0.6 0.9 0.7  PROT 6.3* 6.0* 5.9* 5.7*  ALBUMIN 1.8* 1.8* 2.2* 2.1*   No results for input(s): LIPASE, AMYLASE in the last 168 hours. No results for input(s): AMMONIA in the last 168 hours. Coagulation Profile: Recent Labs  Lab 06/19/19 1935  INR 1.1   Cardiac Enzymes: No results for input(s): CKTOTAL, CKMB, CKMBINDEX, TROPONINI in the last 168 hours. BNP (last 3 results) No results for input(s): PROBNP in the last 8760 hours. HbA1C: No results for input(s): HGBA1C in the last 72 hours. CBG: No results for input(s): GLUCAP in the last 168 hours. Lipid Profile: No results for input(s): CHOL, HDL, LDLCALC, TRIG, CHOLHDL, LDLDIRECT in the last 72 hours. Thyroid Function Tests: Recent Labs    06/22/19 0446 06/22/19 0449  TSH  --  10.903*  FREET4 0.93  --    Anemia Panel: No results for input(s): VITAMINB12, FOLATE, FERRITIN, TIBC, IRON, RETICCTPCT in the last 72 hours. Sepsis Labs: Recent Labs  Lab 06/20/19 0513  PROCALCITON <0.10    Recent Results (from the past 240 hour(s))  Respiratory Panel by RT PCR (Flu A&B, Covid) - Nasopharyngeal Swab     Status: None   Collection Time: 06/19/19  8:24 PM   Specimen: Nasopharyngeal Swab  Result Value Ref Range Status   SARS Coronavirus 2 by RT PCR NEGATIVE NEGATIVE Final    Comment: (NOTE) SARS-CoV-2 target nucleic acids are NOT DETECTED. The SARS-CoV-2 RNA is generally detectable in upper respiratoy specimens during the acute phase of  infection. The lowest concentration of SARS-CoV-2 viral copies this assay can detect is 131 copies/mL. A negative result does not preclude SARS-Cov-2 infection and should not be used as the sole basis for treatment or other patient management decisions. A negative result may occur with  improper specimen collection/handling, submission of specimen other than nasopharyngeal swab, presence of viral mutation(s) within the areas targeted by this assay, and inadequate number of viral copies (<131 copies/mL). A negative result must be combined with clinical observations, patient history, and epidemiological information. The expected result is Negative. Fact Sheet for Patients:  PinkCheek.be Fact Sheet for Healthcare Providers:  GravelBags.it This test is not yet ap proved or cleared by the [place] FDA and  has been authorized for detection and/or diagnosis of SARS-CoV-2 by FDA under an Emergency Use Authorization (EUA). This EUA will remain  in effect (meaning this test can be used) for the duration of the COVID-19 declaration under Section 564(b)(1) of the Act, 21 U.S.C. section 360bbb-3(b)(1), unless the authorization is terminated or revoked sooner.    Influenza A by PCR NEGATIVE NEGATIVE Final   Influenza B by PCR NEGATIVE NEGATIVE Final    Comment: (NOTE) The Xpert Xpress SARS-CoV-2/FLU/RSV assay is intended as an aid in  the diagnosis of influenza from Nasopharyngeal swab specimens and  should not be used as a sole basis for treatment. Nasal washings and  aspirates are unacceptable for Xpert Xpress SARS-CoV-2/FLU/RSV  testing. Fact Sheet for Patients: PinkCheek.be Fact Sheet for Healthcare Providers: GravelBags.it This test is not yet approved or cleared by the [place] FDA and  has been authorized for detection and/or diagnosis of SARS-CoV-2 by  FDA under  an Emergency Use Authorization (EUA). This EUA will remain  in effect (meaning this test can be used) for the duration of the  Covid-19 declaration under Section 564(b)(1) of the Act, 21  U.S.C. section 360bbb-3(b)(1), unless the authorization is  terminated or revoked. Performed at [facility], [place] [address] [place], [place] [address]   Culture, blood (routine x 2)     Status: None (Preliminary result)   Collection Time: 06/19/19 11:25 PM   Specimen: BLOOD  Result Value Ref Range Status   Specimen Description BLOOD RIGHT ARM  Final   Special Requests   Final    BOTTLES DRAWN AEROBIC AND ANAEROBIC Blood Culture adequate volume   Culture   Final    NO GROWTH 3 DAYS Performed at [facility], [place] [address], [place], [place] [address]    Report Status PENDING  Incomplete  Culture, blood (routine x 2)     Status: None (Preliminary result)   Collection Time: 06/19/19 11:30 PM   Specimen: BLOOD  Result Value Ref Range Status   Specimen Description BLOOD LEFT HAND  Final  Special Requests   Final    BOTTLES DRAWN AEROBIC AND ANAEROBIC Blood Culture adequate volume   Culture   Final    NO GROWTH 3 DAYS Performed at [facility], [place] [address], [place], [place] [address]    Report Status PENDING  Incomplete  MRSA PCR Screening     Status: None   Collection Time: 06/20/19  8:59 AM   Specimen: Nasal Mucosa; Nasopharyngeal  Result Value Ref Range Status   MRSA by PCR NEGATIVE NEGATIVE Final    Comment:        The GeneXpert MRSA Assay (FDA approved for NASAL specimens only), is one component of a comprehensive MRSA colonization surveillance program. It is not intended to diagnose MRSA infection nor to guide or monitor treatment for MRSA infections. Performed at [facility], [place] [address] [place], [place] [address]   Gram stain     Status: None   Collection Time: 06/20/19 10:56 AM   Specimen: PATH Cytology Pleural fluid  Result Value Ref Range  Status   Specimen Description FLUID  Final   Special Requests NONE  Final   Gram Stain   Final    CYTOSPIN SMEAR WBC PRESENT, PREDOMINANTLY MONONUCLEAR NO ORGANISMS SEEN Performed at [facility], [place] [address] [place], [place] [address]    Report Status 06/20/2019 FINAL  Final  Culture, body fluid-bottle     Status: None (Preliminary result)   Collection Time: 06/20/19 10:58 AM   Specimen: Fluid  Result Value Ref Range Status   Specimen Description FLUID  Final   Special Requests NONE  Final   Culture   Final    NO GROWTH 2 DAYS Performed at [place] [address], [place],  [address]    Report Status PENDING  Incomplete     RN Pressure Injury Documentation:     Estimated body mass index is 29.35 kg/m as calculated from the following:   Height as of this encounter: 5\' 2"  (1.575 m).   Weight as of this encounter: 72.8 kg.  Malnutrition Type:  Nutrition Problem: Inadequate oral intake Etiology: decreased appetite   Malnutrition Characteristics:  Signs/Symptoms: per patient/family report   Nutrition Interventions:  Interventions: Ensure Enlive (each supplement provides 350kcal and 20 grams of protein), MVI   Radiology Studies: DG CHEST PORT 1 VIEW  Result Date: 06/21/2019 CLINICAL DATA:  Shortness of breath and pleural effusion EXAM: PORTABLE CHEST 1 VIEW COMPARISON:  June 20, 2019 FINDINGS: Right PICC line is identified unchanged. The mediastinal contour and cardiac silhouette are stable. Small to moderate left pleural effusion is increased compared prior exam. Moderate right pleural effusion is unchanged. Scoliosis with prior postsurgical change of cervical spine are noted unchanged. IMPRESSION: Small to moderate left pleural effusion increased compared prior exam. Moderate right pleural effusion is unchanged. Electronically Signed   By: Abelardo Diesel M.D.   On: 06/21/2019 11:19   VAS [place] LOWER EXTREMITY VENOUS (DVT)  Result Date:  06/21/2019  Lower Venous DVTStudy Indications: Edema.  Comparison Study: no prior study Performing Technologist: Sharion Dove RVS  Examination Guidelines: A complete evaluation includes B-mode imaging, spectral Doppler, color Doppler, and power Doppler as needed of all accessible portions of each vessel. Bilateral testing is considered an integral part of a complete examination. Limited examinations for reoccurring indications may be performed as noted. The reflux portion of the exam is performed with the patient in reverse Trendelenburg.  +---------+---------------+---------+-----------+----------+--------------+ RIGHT    CompressibilityPhasicitySpontaneityPropertiesThrombus Aging +---------+---------------+---------+-----------+----------+--------------+  CFV      Full                                         pulsatile      +---------+---------------+---------+-----------+----------+--------------+ SFJ      Full                                                        +---------+---------------+---------+-----------+----------+--------------+ FV Prox  Full                                                        +---------+---------------+---------+-----------+----------+--------------+ FV Mid   Full                                                        +---------+---------------+---------+-----------+----------+--------------+ FV DistalFull                                                        +---------+---------------+---------+-----------+----------+--------------+ PFV      Full                                                        +---------+---------------+---------+-----------+----------+--------------+ POP      Full                                         pulsatile      +---------+---------------+---------+-----------+----------+--------------+ PTV      Full                                                         +---------+---------------+---------+-----------+----------+--------------+ PERO     Full                                                        +---------+---------------+---------+-----------+----------+--------------+   +---------+---------------+---------+-----------+----------+--------------+ LEFT     CompressibilityPhasicitySpontaneityPropertiesThrombus Aging +---------+---------------+---------+-----------+----------+--------------+ CFV      Full                                         pulsatile      +---------+---------------+---------+-----------+----------+--------------+  SFJ      Full                                                        +---------+---------------+---------+-----------+----------+--------------+ FV Prox  Full                                                        +---------+---------------+---------+-----------+----------+--------------+ FV Mid   Full                                                        +---------+---------------+---------+-----------+----------+--------------+ FV DistalFull                                                        +---------+---------------+---------+-----------+----------+--------------+ PFV      Full                                                        +---------+---------------+---------+-----------+----------+--------------+ POP      Full                                         pulsatile      +---------+---------------+---------+-----------+----------+--------------+ PTV      Full                                                        +---------+---------------+---------+-----------+----------+--------------+ PERO     Full                                                        +---------+---------------+---------+-----------+----------+--------------+     Summary: BILATERAL: - No evidence of deep vein thrombosis seen in the lower extremities, bilaterally.   *See table(s)  above for measurements and observations.    Preliminary    Scheduled Meds: . Chlorhexidine Gluconate Cloth  6 each Topical Daily  . cycloSPORINE  1 drop Both Eyes BID  . doxycycline  100 mg Oral BID  . DULoxetine  60 mg Oral Daily  . enoxaparin (LOVENOX) injection  40 mg Subcutaneous Q24H  . feeding supplement (ENSURE ENLIVE)  237 mL Oral BID BM  . folic acid  2 mg Oral Daily  . furosemide  60 mg Intravenous Q12H  . gabapentin  600  mg Oral TID  . losartan  12.5 mg Oral Daily  . melatonin  6 mg Oral QHS  . metoprolol tartrate  25 mg Oral BID  . multivitamin with minerals  1 tablet Oral Daily  . pantoprazole  40 mg Oral BID  . potassium chloride  40 mEq Oral BID  . sodium chloride flush  3 mL Intravenous Q12H  . sodium chloride flush  3 mL Intravenous Q12H   Continuous Infusions: . sodium chloride    .  ceFAZolin (ANCEF) IV 2 g (06/22/19 0603)    LOS: 2 days    Kerney Elbe, DO [facility] PAGER is on [place]  If 7PM-7AM, please contact night-coverage [email or web address]

## 2019-06-23 ENCOUNTER — Inpatient Hospital Stay (HOSPITAL_COMMUNITY): Payer: Medicare PPO

## 2019-06-23 DIAGNOSIS — R7881 Bacteremia: Secondary | ICD-10-CM

## 2019-06-23 DIAGNOSIS — B9561 Methicillin susceptible Staphylococcus aureus infection as the cause of diseases classified elsewhere: Secondary | ICD-10-CM

## 2019-06-23 DIAGNOSIS — Z6827 Body mass index (BMI) 27.0-27.9, adult: Secondary | ICD-10-CM

## 2019-06-23 DIAGNOSIS — I509 Heart failure, unspecified: Secondary | ICD-10-CM

## 2019-06-23 LAB — COMPREHENSIVE METABOLIC PANEL
ALT: <5 U/L (ref 0–44)
AST: 19 U/L (ref 15–41)
Albumin: 1.9 g/dL — ABNORMAL LOW (ref 3.5–5.0)
Alkaline Phosphatase: 132 U/L — ABNORMAL HIGH (ref 38–126)
Anion gap: 8 (ref 5–15)
BUN: 15 mg/dL (ref 8–23)
CO2: 36 mmol/L — ABNORMAL HIGH (ref 22–32)
Calcium: 7.9 mg/dL — ABNORMAL LOW (ref 8.9–10.3)
Chloride: 94 mmol/L — ABNORMAL LOW (ref 98–111)
Creatinine, Ser: 0.84 mg/dL (ref 0.44–1.00)
GFR calc Af Amer: >60 mL/min (ref >60)
GFR calc non Af Amer: >60 mL/min (ref >60)
Glucose, Bld: 98 mg/dL (ref 70–99)
Potassium: 3.7 mmol/L (ref 3.5–5.1)
Sodium: 138 mmol/L (ref 135–145)
Total Bilirubin: 0.8 mg/dL (ref 0.3–1.2)
Total Protein: 5.6 g/dL — ABNORMAL LOW (ref 6.5–8.1)

## 2019-06-23 LAB — CBC WITH DIFFERENTIAL/PLATELET
Abs Immature Granulocytes: 0.05 10*3/uL (ref 0.00–0.07)
Basophils Absolute: 0.1 10*3/uL (ref 0.0–0.1)
Basophils Relative: 1 %
Eosinophils Absolute: 0.4 10*3/uL (ref 0.0–0.5)
Eosinophils Relative: 4 %
HCT: 28.9 % — ABNORMAL LOW (ref 36.0–46.0)
Hemoglobin: 8.9 g/dL — ABNORMAL LOW (ref 12.0–15.0)
Immature Granulocytes: 1 %
Lymphocytes Relative: 38 %
Lymphs Abs: 3.2 10*3/uL (ref 0.7–4.0)
MCH: 31.8 pg (ref 26.0–34.0)
MCHC: 30.8 g/dL (ref 30.0–36.0)
MCV: 103.2 fL — ABNORMAL HIGH (ref 80.0–100.0)
Monocytes Absolute: 0.7 10*3/uL (ref 0.1–1.0)
Monocytes Relative: 8 %
Neutro Abs: 4.1 10*3/uL (ref 1.7–7.7)
Neutrophils Relative %: 48 %
Platelets: 278 10*3/uL (ref 150–400)
RBC: 2.8 MIL/uL — ABNORMAL LOW (ref 3.87–5.11)
RDW: 16.5 % — ABNORMAL HIGH (ref 11.5–15.5)
WBC: 8.4 10*3/uL (ref 4.0–10.5)
nRBC: 0 % (ref 0.0–0.2)

## 2019-06-23 LAB — MAGNESIUM: Magnesium: 1.7 mg/dL (ref 1.7–2.4)

## 2019-06-23 LAB — CYTOLOGY - NON PAP

## 2019-06-23 LAB — PHOSPHORUS: Phosphorus: 3.2 mg/dL (ref 2.5–4.6)

## 2019-06-23 MED ORDER — METOPROLOL SUCCINATE ER 25 MG PO TB24
25.0000 mg | ORAL_TABLET | Freq: Two times a day (BID) | ORAL | Status: DC
  Administered 2019-06-23 – 2019-07-01 (×15): 25 mg via ORAL
  Filled 2019-06-23 (×17): qty 1

## 2019-06-23 MED ORDER — POTASSIUM CHLORIDE CRYS ER 20 MEQ PO TBCR
40.0000 meq | EXTENDED_RELEASE_TABLET | Freq: Once | ORAL | Status: AC
  Administered 2019-06-23: 40 meq via ORAL
  Filled 2019-06-23: qty 2

## 2019-06-23 MED ORDER — MAGNESIUM SULFATE 2 GM/50ML IV SOLN
2.0000 g | Freq: Once | INTRAVENOUS | Status: AC
  Administered 2019-06-23: 2 g via INTRAVENOUS
  Filled 2019-06-23: qty 50

## 2019-06-23 NOTE — Progress Notes (Signed)
PROGRESS NOTE    KADIAN BARCELLOS  KZS:[id number] DOB: Apr 22, 1945 DOA: 06/19/2019 PCP: Patient, [name]  Brief Narrative:  HPI per Dr. Christia Reading Opyd 06/19/19 Babs Bertin Modesitt is a 74 y.o. female with medical history significant for rheumatoid arthritis previously on Humira, chronic pain, and complicated hospitalization in [place] beginning in late January and involving MSSA bacteremia, now presenting to the ED for evaluation of concerning CT chest findings.  Patient had CT chest performed today with large bilateral pleural effusions, complete collapse of left lower lobe, partial collapse of the lingula, and spiculated nodule in the right upper lobe.  She was directed to the ED for evaluation of this.  Patient reports chronic dyspnea and cough but has not appreciated any acute change and denies any recent fevers, chills, chest pain, or palpitations.  Patient had a complicated hospitalization beginning in late January when she presented with fevers, confusion, and hypoxia, and was found to be hyponatremic with AKI, right upper lobe mass concerning for pneumonia versus cancer, MSSA bacteremia, lower extremity cellulitis with abscess, and upper GI bleed.  She had TEE negative for vegetation, had vegetation at the tip of her central line, underwent I&D abscess, and was eventually discharged to a local SNF with PICC for long course of cefazolin.  She has since established with local GI, pulmonology, and ID, and had an outpatient CT chest performed today with findings that prompted her current presentation.  ED Course: Upon arrival to the ED, patient is found to be afebrile, saturating 100% on 2 L/min of supplemental oxygen, slightly tachycardic, and with stable blood pressure.  EKG features sinus rhythm with LVH, secondary repolarization abnormality, and QTc interval 501 ms.  Chemistry panel notable for albumin of 1.8.  CBC with hemoglobin of 9.7 and MCV 103.3.  BNP is elevated to 1844.   Troponin and INR are normal.  Infectious disease was consulted by the ED physician, influenza and Covid PCR are in process, and hospitalist asked to admit.  **Interim History  Patient continues to be significantly volume overloaded and states that her shortness of breath is little bit better but still has some.    Further worked-up her anasarca and given that she had an elevated BNP ordered echocardiogram and lower extremity venous duplex to rule out DVT.  Echocardiogram showed an EF of 20 to 25% and from review of outside records from the hospital she had an EF of 50 to 55% in January.  Cardiology was consulted for her new systolic CHF and they recommend against cath at this time and recommending medical management and diuresis and they have started her on losartan.  LE Venous duplex was negative for DVT.  Patient is -10.217 Liters now and weight is down 12 lbs since admission. Is continuing to be diuresed with IV Lasix 60 mg and Cardiology considering adding Spironolactone.  Assessment & Plan:   Principal Problem:   Large pleural effusion Active Problems:   Bacteremia due to methicillin susceptible Staphylococcus aureus (MSSA)   History of GI bleed   Rheumatoid arthritis (HCC)   Anemia   Nodule of right lung   Anasarca   Protein-calorie malnutrition, mild (HCC)   Pleural effusion  Large pleural effusions; right lung nodule; chronic hypoxic respiratory failure in the setting of acute systolic CHF with a EF of 20-25% - Patient has been on cefazolin at her SNF for MSSA bacteremia secondary to LE cellulitis vs PNA and had follow-up chest CT and CT Scan showed that the  patient was found to have large bilateral pleural effusions with complete LLL collapse, partial collapse of lingula, and spiculated RUL nodule  - She is stable on her usual 2 Lpm and denies any recent change in chronic cough and dyspnea  -Consulted IR for thoracentesis with fluid analysis including cytology and this was  d -Monocyte-macrocytosis fluid was 18 and straw-colored fluid movements and 650 mL was removed  -Repeat chest x-ray 06/23/19 showed "Bilateral pleural effusions, moderate on the right and small on the left, similar. Suspect mild pulmonary edema with bibasilar atelectasis." -Echocardiogram was ordered to rule out Cardiac Causes and showed "Left ventricular ejection fraction, by estimation, is 20 to 25%. The Left ventricle has severely decreased function. The left ventricle demonstrates global hypokinesis. Indeterminate diastolic filling due to E-A fusion.  Right ventricular systolic function is mildly reduced. The right  ventricular size is moderately enlarged. There is moderately elevated  pulmonary artery systolic pressure. The estimated right ventricular  systolic pressure is 32.3 mmHg. Right atrial size was mild to moderately dilated." -Gven albumin and Lasix combination x1 the day before the firs day and then started Scheduled Diuresis with IV Lasix 40 mg q12h and Cardiology was consulted and increased this to 60 mg IV BID -Strict I's and O's and daily weights; She is -10.217 Liters since admission and Weight is normally 115 and she is 160.5 on admission and on recheck today she was 148 lbs  -BP was elevated on admission at 1843 and does have CHF -Repeat chest x-ray in a.m.   MSSA Bacteremia   - Admitted to a hospital in [place] in late January with MSSA bacteremia, suspected PNA, and LE cellulitis with abscess  - She had TEE without vegetation; there was vegetation at tip of central line  - She has been on cefazolin with end date 06/25/2019; she is also on doxycycline  - ID consulted by ED physician and much appreciate  - No fever or leukocytosis on admission  -Continue IV Cefazolin and po Doxycycline per ID  -Repeat blood cultures  and showed no growth to date less than 12 hours -She underwent a thoracentesis this admission  -Repeat chest x-ray intermittently    Macrocytic Anemia; recent  UGIB  -Hgb/Hct went from 9.7/31.5 -> 8.7/20.4 -> 8.3/27.2 -> 8.8/28.4 -> 8.9/28.9 -MCV was 103.2 -She is established with local GI and planned for EGD in April  -Continue Pantoprazole 40 mg po BID   -Need to monitor for signs and symptoms of bleeding; currently no overt bleeding noted -Repeat CBC in a.m.  Rheumatoid Arthritis Chronic Pain  -Continue Duloxetine 60 mg po Daily, Gabapentin 600 mg po TID and Baclofen 10 mg po TID Daily PRN  Anasarca and volume overload likely multifactorial in the setting of poor nutritional status and acute systolic CHF with an EF of 20 to 25% -In the setting of poor nutritional status and low albumin 1.8 and prealbumin 9.8 and now acute systolic CHF -Consult nutritionist and they recommended a speech eval because the patient was on pured diet; SLP has been called the diet will remain on mechanical soft with thin liquids -Give albumin 25 g as well as IV Lasix 40 mg and see volume response initially but was started on scheduled diuresis with IV 40 mg of Lasix and cardiology increase this to IV 60 mg BID  -Checked echocardiogram and lower extremity venous duplex to rule out DVT -BNP was elevated  -Continue with Ensure Enlive p.o. twice daily as well as a multivitamin daily -Unlikely  the patient's anasarca is related to nephrotic syndrome given the lack of proteinuria on her urinalysis -LE Venous Duplex to r/o DVT and this was negative -Cardiology was consulted for further evaluation recommendations and appreciate their assistance and Dr. Harl Bowie feels that she remains massively volume overloaded and recommends to continue to hold beta-blocker until more euvolemic and start losartan 12.5 mg p.o. daily and are considering adding Spironolactone -Metoprolol 25 mg po BID was on board but stopped until Cardiology felt that she was a little more Euvolemic  -Cardiology feels that she will require several days of IV diuresis and will continue Diuresis per Card  Recc's -Monitor volume status carefully and repeat chest x-ray in a.m.  Hypomagnesemia -Patient's magnesium level was 1.7 -Replete with IV mag sulfate 2 g again  -Continue to monitor and replete as necessary -Repeat magnesium level in the a.m.  Hypokalemia -Patient's potassium was 3.0 and is now 3.7 -Likely in the setting of IV diuresis  -Replete with po KCl 40 mEQ x1 -likely will require scheduled potassium replacement  -continue to monitor and replete as necessary -Repeat CMP in AM   Elevated TSH -TSH was 10.903 -Likely in the setting of her infection and reactive -Free T4 was normal at 0.93 -Recommend repeating thyroid function studies in 4 to 6 weeks in outpatient setting  DVT prophylaxis: Enoxaparin 40 mg sq q24h Code Status: FULL CODE  Family Communication: No family present at bedside  Disposition Plan: The patient is from SNF and likely will go back to SNF once her volume status improves and she is diuresed back to her dry weight per cardiology recommendations  Consultants:   Infectious Diseases    Procedures:  ECHOCARDIOGRAM IMPRESSIONS    1. Left ventricular ejection fraction, by estimation, is 20 to 25%. The  left ventricle has severely decreased function. The left ventricle  demonstrates global hypokinesis. Indeterminate diastolic filling due to  E-A fusion.  2. Right ventricular systolic function is mildly reduced. The right  ventricular size is moderately enlarged. There is moderately elevated  pulmonary artery systolic pressure. The estimated right ventricular  systolic pressure is 86.7 mmHg.  3. Right atrial size was mild to moderately dilated.  4. The mitral valve is grossly normal. Mild to moderate mitral valve  regurgitation.  5. Tricuspid valve regurgitation is moderate.  6. The aortic valve is tricuspid. Aortic valve regurgitation is not  visualized. No aortic stenosis is present.  7. The inferior vena cava is dilated in size with <50%  respiratory  variability, suggesting right atrial pressure of 15 mmHg.   FINDINGS  Left Ventricle: Left ventricular ejection fraction, by estimation, is 20  to 25%. The left ventricle has severely decreased function. The left  ventricle demonstrates global hypokinesis. The left ventricular internal  cavity size was normal in size. There  is no left ventricular hypertrophy. Abnormal (paradoxical) septal motion,  consistent with left bundle branch block. Indeterminate diastolic filling  due to E-A fusion.   Right Ventricle: The right ventricular size is moderately enlarged. No  increase in right ventricular wall thickness. Right ventricular systolic  function is mildly reduced. There is moderately elevated pulmonary artery  systolic pressure. The tricuspid  regurgitant velocity is 3.08 m/s, and with an assumed right atrial  pressure of 15 mmHg, the estimated right ventricular systolic pressure is  61.9 mmHg.   Left Atrium: Left atrial size was normal in size.   Right Atrium: Right atrial size was mild to moderately dilated.   Pericardium: There is no  evidence of pericardial effusion. Presence of  pericardial fat pad.   Mitral Valve: The mitral valve is grossly normal. Mild to moderate mitral  valve regurgitation.   Tricuspid Valve: The tricuspid valve is grossly normal. Tricuspid valve  regurgitation is moderate.   Aortic Valve: The aortic valve is tricuspid. Aortic valve regurgitation is  not visualized. No aortic stenosis is present.   Pulmonic Valve: The pulmonic valve was grossly normal. Pulmonic valve  regurgitation is not visualized. No evidence of pulmonic stenosis.   Aorta: The aortic root and ascending aorta are structurally normal, with  no evidence of dilitation.   Venous: The inferior vena cava is dilated in size with less than 50%  respiratory variability, suggesting right atrial pressure of 15 mmHg.   IAS/Shunts: The atrial septum is grossly normal.    Additional Comments: A venous catheter is visualized in the right atrium.  There is a small pleural effusion in the left lateral region.     LEFT VENTRICLE  PLAX 2D  LVIDd:     4.50 cm   Diastology  LVIDs:     3.60 cm   LV e' lateral:  6.30 cm/s  LV PW:     1.00 cm   LV E/e' lateral: 12.9  LV IVS:    0.80 cm   LV e' medial:  4.06 cm/s  LVOT diam:   1.90 cm   LV E/e' medial: 20.0  LV SV:     31  LV SV Index:  18  LVOT Area:   2.84 cm    LV Volumes (MOD)  LV vol d, MOD A2C: 79.9 ml  LV vol d, MOD A4C: 72.4 ml  LV vol s, MOD A2C: 51.7 ml  LV vol s, MOD A4C: 45.7 ml  LV SV MOD A2C:   28.2 ml  LV SV MOD A4C:   72.4 ml  LV SV MOD BP:   29.5 ml   RIGHT VENTRICLE  RV S prime:   8.70 cm/s  TAPSE (M-mode): 1.2 cm   LEFT ATRIUM       Index    RIGHT ATRIUM      Index  LA diam:    3.50 cm 2.01 cm/m RA Area:   16.90 cm  LA Vol (A2C):  36.2 ml 20.79 ml/m RA Volume:  50.10 ml 28.78 ml/m  LA Vol (A4C):  41.3 ml 23.72 ml/m  LA Biplane Vol: 38.8 ml 22.29 ml/m  AORTIC VALVE  LVOT Vmax:  70.00 cm/s  LVOT Vmean: 44.400 cm/s  LVOT VTI:  0.108 m    AORTA  Ao Root diam: 2.30 cm  Ao Asc diam: 3.00 cm   MITRAL VALVE        TRICUSPID VALVE  MV Area (PHT): 3.99 cm  TR Peak grad:  37.9 mmHg  MV Decel Time: 190 msec  TR Vmax:    308.00 cm/s  MR Peak grad: 33.6 mmHg  MR Vmax:   290.00 cm/s SHUNTS  MV E velocity: 81.00 cm/s Systemic VTI: 0.11 m  MV A velocity: 72.20 cm/s Systemic Diam: 1.90 cm  MV E/A ratio: 1.12   LE VENOUS DUPLEX BILATERAL:  - No evidence of deep vein thrombosis seen in the lower extremities,  bilaterally.   Antimicrobials:  Anti-infectives (From admission, onward)   Start     Dose/Rate Route Frequency Ordered Stop   06/19/19 2315  doxycycline (VIBRA-TABS) tablet 100 mg     100 mg Oral 2 times daily 06/19/19 2304  06/19/19 2315  ceFAZolin (ANCEF) IVPB  2g/100 mL premix     2 g 200 mL/hr over 30 Minutes Intravenous Every 8 hours 06/19/19 2306       Subjective: Seen and examined at bedside states that she did not sleep very well last night.  No nausea or vomiting.  Denies any chest pain but states that she still is a little short of breath though.  No other concerns or complaints at this time and happy that she is improving somewhat with her swelling.  Objective: Vitals:   06/22/19 1954 06/23/19 0447 06/23/19 0744 06/23/19 0817  BP: (!) 107/47 (!) 110/50 117/64   Pulse:  82 85 84  Resp: 17 15 16 16   Temp: 98.1 F (36.7 C) 97.7 F (36.5 C) 98.2 F (36.8 C)   TempSrc: Oral Oral Oral   SpO2: 100% 100% 100% 100%  Weight:  67.5 kg    Height:        Intake/Output Summary (Last 24 hours) at 06/23/2019 1128 Last data filed at 06/23/2019 1104 Gross per 24 hour  Intake 640 ml  Output 4950 ml  Net -4310 ml   Filed Weights   06/20/19 0150 06/23/19 0447  Weight: 72.8 kg 67.5 kg   Examination: Physical Exam:  Constitutional: WN/WD overweight Caucasian female currently in no acute distress appears calm Eyes: Lids and conjunctivae normal, sclerae anicteric  ENMT: External Ears, Nose appear normal. Grossly normal hearing. Mucous membranes are moist.  Neck: Appears normal, supple, no cervical masses, normal ROM, no appreciable thyromegaly; no JVD Respiratory: Diminished to auscultation bilaterally with coarse breath sounds and some mild crackles bilaterally but no reachable wheezing, rales, rhonchi. Normal respiratory effort and patient is not tachypenic. No accessory muscle use.  Wearing supplemental oxygen via nasal cannula 2 L Cardiovascular: RRR, no murmurs / rubs / gallops. S1 and S2 auscultated.  Continues to have significant 2+ lower extremity pitting edema and has some anasarca Abdomen: Soft, non-tender, distended secondary body habitus. No masses palpated. Bowel sounds positive.  GU: Deferred. Musculoskeletal: No clubbing /  cyanosis of digits/nails. No joint deformity upper and lower extremities.  Skin: No rashes, lesions, ulcers on limited skin evaluation. No induration; Warm and dry.  Neurologic: CN 2-12 grossly intact with no focal deficits. Romberg sign and cerebellar reflexes not assessed.  Psychiatric: Normal judgment and insight. Alert and oriented x 3. Normal mood and appropriate affect.   Data Reviewed: I have personally reviewed following labs and imaging studies  CBC: Recent Labs  Lab 06/19/19 1935 06/20/19 0513 06/21/19 0422 06/22/19 0449 06/23/19 0505  WBC 6.3 10.1 8.0 8.3 8.4  NEUTROABS 3.8 6.0 3.8 4.0 4.1  HGB 9.7* 8.7* 8.3* 8.8* 8.9*  HCT 31.5* 28.4* 27.2* 28.4* 28.9*  MCV 103.3* 102.5* 105.4* 103.3* 103.2*  PLT 360 340 311 316 478   Basic Metabolic Panel: Recent Labs  Lab 06/19/19 1935 06/20/19 0513 06/21/19 0422 06/22/19 0449 06/23/19 0505  NA 136 138 136 136 138  K 3.8 3.6 3.4* 3.0* 3.7  CL 99 100 99 94* 94*  CO2 27 29 29  33* 36*  GLUCOSE 163* 135* 95 98 98  BUN 10 13 13 16 15   CREATININE 0.68 0.77 0.73 0.70 0.84  CALCIUM 8.1* 8.3* 8.1* 8.0* 7.9*  MG  --   --  1.6* 1.6* 1.7  PHOS  --   --  3.7 3.5 3.2   GFR: Estimated Creatinine Clearance: 54.6 mL/min (by C-G formula based on SCr of 0.84 mg/dL). Liver Function Tests:  Recent Labs  Lab 06/19/19 1935 06/20/19 0513 06/21/19 0422 06/22/19 0449 06/23/19 0505  AST 25 21 39 22 19  ALT 6 <5 <5 <5 <5  ALKPHOS 145* 131* 154* 144* 132*  BILITOT 0.8 0.6 0.9 0.7 0.8  PROT 6.3* 6.0* 5.9* 5.7* 5.6*  ALBUMIN 1.8* 1.8* 2.2* 2.1* 1.9*   No results for input(s): LIPASE, AMYLASE in the last 168 hours. No results for input(s): AMMONIA in the last 168 hours. Coagulation Profile: Recent Labs  Lab 06/19/19 1935  INR 1.1   Cardiac Enzymes: No results for input(s): CKTOTAL, CKMB, CKMBINDEX, TROPONINI in the last 168 hours. BNP (last 3 results) No results for input(s): PROBNP in the last 8760 hours. HbA1C: No results for  input(s): HGBA1C in the last 72 hours. CBG: No results for input(s): GLUCAP in the last 168 hours. Lipid Profile: No results for input(s): CHOL, HDL, LDLCALC, TRIG, CHOLHDL, LDLDIRECT in the last 72 hours. Thyroid Function Tests: Recent Labs    06/22/19 0446 06/22/19 0449  TSH  --  10.903*  FREET4 0.93  --    Anemia Panel: No results for input(s): VITAMINB12, FOLATE, FERRITIN, TIBC, IRON, RETICCTPCT in the last 72 hours. Sepsis Labs: Recent Labs  Lab 06/20/19 0513  PROCALCITON <0.10    Recent Results (from the past 240 hour(s))  Respiratory Panel by RT PCR (Flu A&B, Covid) - Nasopharyngeal Swab     Status: None   Collection Time: 06/19/19  8:24 PM   Specimen: Nasopharyngeal Swab  Result Value Ref Range Status   SARS Coronavirus 2 by RT PCR NEGATIVE NEGATIVE Final    Comment: (NOTE) SARS-CoV-2 target nucleic acids are NOT DETECTED. The SARS-CoV-2 RNA is generally detectable in upper respiratoy specimens during the acute phase of infection. The lowest concentration of SARS-CoV-2 viral copies this assay can detect is 131 copies/mL. A negative result does not preclude SARS-Cov-2 infection and should not be used as the sole basis for treatment or other patient management decisions. A negative result may occur with  improper specimen collection/handling, submission of specimen other than nasopharyngeal swab, presence of viral mutation(s) within the areas targeted by this assay, and inadequate number of viral copies (<131 copies/mL). A negative result must be combined with clinical observations, patient history, and epidemiological information. The expected result is Negative. Fact Sheet for Patients:  PinkCheek.be Fact Sheet for Healthcare Providers:  GravelBags.it This test is not yet ap proved or cleared by the [place] FDA and  has been authorized for detection and/or diagnosis of SARS-CoV-2 by FDA under an  Emergency Use Authorization (EUA). This EUA will remain  in effect (meaning this test can be used) for the duration of the COVID-19 declaration under Section 564(b)(1) of the Act, 21 U.S.C. section 360bbb-3(b)(1), unless the authorization is terminated or revoked sooner.    Influenza A by PCR NEGATIVE NEGATIVE Final   Influenza B by PCR NEGATIVE NEGATIVE Final    Comment: (NOTE) The Xpert Xpress SARS-CoV-2/FLU/RSV assay is intended as an aid in  the diagnosis of influenza from Nasopharyngeal swab specimens and  should not be used as a sole basis for treatment. Nasal washings and  aspirates are unacceptable for Xpert Xpress SARS-CoV-2/FLU/RSV  testing. Fact Sheet for Patients: PinkCheek.be Fact Sheet for Healthcare Providers: GravelBags.it This test is not yet approved or cleared by the [place] FDA and  has been authorized for detection and/or diagnosis of SARS-CoV-2 by  FDA under an Emergency Use Authorization (EUA). This EUA will remain  in effect (meaning  this test can be used) for the duration of the  Covid-19 declaration under Section 564(b)(1) of the Act, 21  U.S.C. section 360bbb-3(b)(1), unless the authorization is  terminated or revoked. Performed at [facility], [place] [address] [place], [place] [address]   Culture, blood (routine x 2)     Status: None (Preliminary result)   Collection Time: 06/19/19 11:25 PM   Specimen: BLOOD  Result Value Ref Range Status   Specimen Description BLOOD RIGHT ARM  Final   Special Requests   Final    BOTTLES DRAWN AEROBIC AND ANAEROBIC Blood Culture adequate volume Performed at [facility], [place] [address], [place], [place] [address]    Culture NO GROWTH 4 DAYS  Final   Report Status PENDING  Incomplete  Culture, blood (routine x 2)     Status: None (Preliminary result)   Collection Time: 06/19/19 11:30 PM   Specimen: BLOOD  Result Value Ref Range Status    Specimen Description BLOOD LEFT HAND  Final   Special Requests   Final    BOTTLES DRAWN AEROBIC AND ANAEROBIC Blood Culture adequate volume Performed at [facility], [place] [address] [place], [place] [address]    Culture NO GROWTH 4 DAYS  Final   Report Status PENDING  Incomplete  MRSA PCR Screening     Status: None   Collection Time: 06/20/19  8:59 AM   Specimen: Nasal Mucosa; Nasopharyngeal  Result Value Ref Range Status   MRSA by PCR NEGATIVE NEGATIVE Final    Comment:        The GeneXpert MRSA Assay (FDA approved for NASAL specimens only), is one component of a comprehensive MRSA colonization surveillance program. It is not intended to diagnose MRSA infection nor to guide or monitor treatment for MRSA infections. Performed at [facility], [place] [address], [place], [place] [address]   Gram stain     Status: None   Collection Time: 06/20/19 10:56 AM   Specimen: PATH Cytology Pleural fluid  Result Value Ref Range Status   Specimen Description FLUID  Final   Special Requests NONE  Final   Gram Stain   Final    CYTOSPIN SMEAR WBC PRESENT, PREDOMINANTLY MONONUCLEAR NO ORGANISMS SEEN Performed at [facility], [place] [address] [place], [place] [address]    Report Status 06/20/2019 FINAL  Final  Culture, body fluid-bottle     Status: None (Preliminary result)   Collection Time: 06/20/19 10:58 AM   Specimen: Fluid  Result Value Ref Range Status   Specimen Description FLUID  Final   Special Requests NONE  Final   Culture NO GROWTH 3 DAYS  Final   Report Status PENDING  Incomplete     RN Pressure Injury Documentation:     Estimated body mass index is 27.22 kg/m as calculated from the following:   Height as of this encounter: 5\' 2"  (1.575 m).   Weight as of this encounter: 67.5 kg.  Malnutrition Type:  Nutrition Problem: Inadequate oral intake Etiology: decreased appetite   Malnutrition Characteristics:  Signs/Symptoms: per patient/family  report   Nutrition Interventions:  Interventions: Ensure Enlive (each supplement provides 350kcal and 20 grams of protein), MVI   Radiology Studies: DG CHEST PORT 1 VIEW  Result Date: 06/23/2019 CLINICAL DATA:  Shortness of breath, large pleural effusion. EXAM: PORTABLE CHEST 1 VIEW COMPARISON:  06/21/2019 and CT chest 06/19/2019. FINDINGS: Right PICC terminates in the low SVC or at the SVC RA junction.  Trachea is midline. Heart size stable. There are bilateral pleural effusions, large on the right and moderate on the left, similar to 06/21/2019. There may be mild mid and lower lung zone interstitial prominence and indistinctness. Right humeral head is high riding and there are degenerative changes in the right shoulder. IMPRESSION: 1. Bilateral pleural effusions, moderate on the right and small on the left, similar. 2. Suspect mild pulmonary edema with bibasilar atelectasis. Electronically Signed   By: Lorin Picket M.D.   On: 06/23/2019 07:56   Scheduled Meds:  Chlorhexidine Gluconate Cloth  6 each Topical Daily   cycloSPORINE  1 drop Both Eyes BID   doxycycline  100 mg Oral BID   DULoxetine  60 mg Oral Daily   enoxaparin (LOVENOX) injection  40 mg Subcutaneous Q24H   feeding supplement (ENSURE ENLIVE)  237 mL Oral BID BM   folic acid  2 mg Oral Daily   furosemide  60 mg Intravenous Q12H   gabapentin  600 mg Oral TID   losartan  12.5 mg Oral Daily   melatonin  6 mg Oral QHS   metoprolol tartrate  25 mg Oral BID   multivitamin with minerals  1 tablet Oral Daily   pantoprazole  40 mg Oral BID   sodium chloride flush  3 mL Intravenous Q12H   sodium chloride flush  3 mL Intravenous Q12H   Continuous Infusions:  sodium chloride      ceFAZolin (ANCEF) IV 2 g (06/23/19 0508)   magnesium sulfate bolus IVPB 2 g (06/23/19 1047)    LOS: 3 days    Kerney Elbe, DO [facility] PAGER is on AMION  If 7PM-7AM, please contact  night-coverage [email or web address]

## 2019-06-23 NOTE — Progress Notes (Signed)
Progress Note  Patient Name: Samantha Keller Date of Encounter: 06/23/2019  Primary Cardiologist: New  Subjective   Breathing is OK  No CP    Inpatient Medications    Scheduled Meds: . Chlorhexidine Gluconate Cloth  6 each Topical Daily  . cycloSPORINE  1 drop Both Eyes BID  . doxycycline  100 mg Oral BID  . DULoxetine  60 mg Oral Daily  . enoxaparin (LOVENOX) injection  40 mg Subcutaneous Q24H  . feeding supplement (ENSURE ENLIVE)  237 mL Oral BID BM  . folic acid  2 mg Oral Daily  . furosemide  60 mg Intravenous Q12H  . gabapentin  600 mg Oral TID  . losartan  12.5 mg Oral Daily  . melatonin  6 mg Oral QHS  . metoprolol tartrate  25 mg Oral BID  . multivitamin with minerals  1 tablet Oral Daily  . pantoprazole  40 mg Oral BID  . potassium chloride  40 mEq Oral Once  . sodium chloride flush  3 mL Intravenous Q12H  . sodium chloride flush  3 mL Intravenous Q12H   Continuous Infusions: . sodium chloride    .  ceFAZolin (ANCEF) IV 2 g (06/23/19 0508)  . magnesium sulfate bolus IVPB     PRN Meds: sodium chloride, acetaminophen **OR** acetaminophen, baclofen, lidocaine, polyethylene glycol, sodium chloride flush   Vital Signs    Vitals:   06/22/19 1954 06/23/19 0447 06/23/19 0744 06/23/19 0817  BP: (!) 107/47 (!) 110/50 117/64   Pulse:  82 85 84  Resp: 17 15 16 16   Temp: 98.1 F (36.7 C) 97.7 F (36.5 C) 98.2 F (36.8 C)   TempSrc: Oral Oral Oral   SpO2: 100% 100% 100% 100%  Weight:  67.5 kg    Height:        Intake/Output Summary (Last 24 hours) at 06/23/2019 1035 Last data filed at 06/23/2019 0448 Gross per 24 hour  Intake 640 ml  Output 3850 ml  Net -3210 ml   Net neg 9.1 L   Last 3 Weights 06/23/2019 06/20/2019 06/11/2019  Weight (lbs) 148 lb 13 oz 160 lb 7.9 oz 165 lb 3 oz  Weight (kg) 67.5 kg 72.8 kg 74.929 kg      Telemetry    SR - Personally Reviewed  ECG    n/a - Personally Reviewed  Physical Exam   GEN: No acute distress.   Neck:  elevated JVD Cardiac: RRR, no murmurs, rubs, or gallops.  Respiratory: coarse bilaterally GI: Soft, nontender, non-distended  MS: 2+ bilateral LE edema; No deformity. Neuro:  Nonfocal  Psych: Normal affect   Labs    High Sensitivity Troponin:   Recent Labs  Lab 06/19/19 1935  TROPONINIHS 12      Chemistry Recent Labs  Lab 06/21/19 0422 06/22/19 0449 06/23/19 0505  NA 136 136 138  K 3.4* 3.0* 3.7  CL 99 94* 94*  CO2 29 33* 36*  GLUCOSE 95 98 98  BUN 13 16 15   CREATININE 0.73 0.70 0.84  CALCIUM 8.1* 8.0* 7.9*  PROT 5.9* 5.7* 5.6*  ALBUMIN 2.2* 2.1* 1.9*  AST 39 22 19  ALT <5 <5 <5  ALKPHOS 154* 144* 132*  BILITOT 0.9 0.7 0.8  GFRNONAA >60 >60 >60  GFRAA >60 >60 >60  ANIONGAP 8 9 8      Hematology Recent Labs  Lab 06/21/19 0422 06/22/19 0449 06/23/19 0505  WBC 8.0 8.3 8.4  RBC 2.58* 2.75* 2.80*  HGB 8.3* 8.8* 8.9*  HCT 27.2* 28.4* 28.9*  MCV 105.4* 103.3* 103.2*  MCH 32.2 32.0 31.8  MCHC 30.5 31.0 30.8  RDW 17.2* 16.7* 16.5*  PLT 311 316 278    BNP Recent Labs  Lab 06/19/19 1940  BNP 1,843.9*     DDimer No results for input(s): DDIMER in the last 168 hours.   Radiology    DG CHEST PORT 1 VIEW  Result Date: 06/23/2019 CLINICAL DATA:  Shortness of breath, large pleural effusion. EXAM: PORTABLE CHEST 1 VIEW COMPARISON:  06/21/2019 and CT chest 06/19/2019. FINDINGS: Right PICC terminates in the low SVC or at the SVC RA junction. Trachea is midline. Heart size stable. There are bilateral pleural effusions, large on the right and moderate on the left, similar to 06/21/2019. There may be mild mid and lower lung zone interstitial prominence and indistinctness. Right humeral head is high riding and there are degenerative changes in the right shoulder. IMPRESSION: 1. Bilateral pleural effusions, moderate on the right and small on the left, similar. 2. Suspect mild pulmonary edema with bibasilar atelectasis. Electronically Signed   By: Lorin Picket M.D.    On: 06/23/2019 07:56    Cardiac Studies     Patient Profile     Samantha Keller is a 74 y.o. female with a hx of rheumatoid arthritis, recent extended admission in [facility] with MSSA bacteremia,  who is being seen today for the evaluation of acute HF at the request of Dr . Alfredia Ferguson   Assessment & Plan    1. Acute systolic HF - - 06/1285 echo LVEF 20-25%, mild RV dysfunction, PASP 53 - - BNP 1800 on admission. - Pt has diuresed 10.2 L   - - history of prior GI bleed with progressing anemia this admission, no plans for inpatient cath. Also lung nodule and recent issues with MSSA bacteremia on abx. Quite a few things to resolve prior to cath, no urgent indications and could be done as outpatient down the road.  Will start toprol   Continue IV diuresis today    2. Hypokalemia/Hypomagnesemia - COntinue to replete  And follow     3. High TSH/normal Free T4 - per primary team  Plan for f/u in near future   4  Malnutrition   Alb 1.8     Nutrition seeing    For questions or updates, please contact [place] Please consult [email or web address] for contact info under        Signed, [name], MD  06/23/2019, 10:35 AM

## 2019-06-23 NOTE — Progress Notes (Signed)
Patient ID: [name], female   DOB: [birth year], 74 y.o.   MRN: [id number]         [facility]  Date of Admission:  06/19/2019   Total days of antibiotics 62         ASSESSMENT: She has protein calorie malnutrition, anasarca, CHF and transudate of pleural effusions superimposed upon her recent MSSA bacteremia.  Pleural fluid cultures are negative.  PLAN: 1. Continue cefazolin for 2 more days (which will be 6 weeks of therapy after right hip abscess was drained).  Principal Problem:   Large pleural effusion Active Problems:   Bacteremia due to methicillin susceptible Staphylococcus aureus (MSSA)   History of GI bleed   Rheumatoid arthritis (HCC)   Anemia   Nodule of right lung   Anasarca   Protein-calorie malnutrition, mild (HCC)   Pleural effusion   Scheduled Meds: . Chlorhexidine Gluconate Cloth  6 each Topical Daily  . cycloSPORINE  1 drop Both Eyes BID  . doxycycline  100 mg Oral BID  . DULoxetine  60 mg Oral Daily  . enoxaparin (LOVENOX) injection  40 mg Subcutaneous Q24H  . feeding supplement (ENSURE ENLIVE)  237 mL Oral BID BM  . folic acid  2 mg Oral Daily  . furosemide  60 mg Intravenous Q12H  . gabapentin  600 mg Oral TID  . losartan  12.5 mg Oral Daily  . melatonin  6 mg Oral QHS  . metoprolol tartrate  25 mg Oral BID  . multivitamin with minerals  1 tablet Oral Daily  . pantoprazole  40 mg Oral BID  . potassium chloride  40 mEq Oral Once  . sodium chloride flush  3 mL Intravenous Q12H  . sodium chloride flush  3 mL Intravenous Q12H   Continuous Infusions: . sodium chloride    .  ceFAZolin (ANCEF) IV 2 g (06/23/19 0508)  . magnesium sulfate bolus IVPB     PRN Meds:.sodium chloride, acetaminophen **OR** acetaminophen, baclofen, lidocaine, polyethylene glycol, sodium chloride flush   SUBJECTIVE: She is feeling better.  Review of Systems: Review of Systems  Constitutional: Positive for weight loss. Negative for fever.    Respiratory: Negative for cough, sputum production and shortness of breath.   Cardiovascular: Negative for chest pain.  Gastrointestinal: Negative for abdominal pain, diarrhea, nausea and vomiting.    Allergies  Allergen Reactions  . Iodinated Diagnostic Agents Anaphylaxis  . Shellfish Allergy Anaphylaxis  . Shellfish-Derived Products Anaphylaxis    OBJECTIVE: Vitals:   06/22/19 1358 06/22/19 1954 06/23/19 0447 06/23/19 0744  BP: (!) 101/52 (!) 107/47 (!) 110/50 117/64  Pulse: 90  82 85  Resp: 15 17 15 16   Temp: 98.4 F (36.9 C) 98.1 F (36.7 C) 97.7 F (36.5 C) 98.2 F (36.8 C)  TempSrc: Oral Oral Oral Oral  SpO2: 100% 100% 100% 100%  Weight:   67.5 kg   Height:       Body mass index is 27.22 kg/m.  Physical Exam Constitutional:      Comments: She is in good spirits.  Her weight is down 12 pounds in the past 3 days.  Cardiovascular:     Rate and Rhythm: Normal rate and regular rhythm.     Heart sounds: No murmur.  Pulmonary:     Effort: Pulmonary effort is normal.     Comments: Decreased breath sounds in right base. Abdominal:     Palpations: Abdomen is soft.     Tenderness: There is no  abdominal tenderness.  Musculoskeletal:     Right lower leg: Edema present.     Left lower leg: Edema present.  Skin:    Findings: No rash.  Neurological:     General: No focal deficit present.  Psychiatric:        Mood and Affect: Mood normal.     Lab Results Lab Results  Component Value Date   WBC 8.4 06/23/2019   HGB 8.9 (L) 06/23/2019   HCT 28.9 (L) 06/23/2019   MCV 103.2 (H) 06/23/2019   PLT 278 06/23/2019    Lab Results  Component Value Date   CREATININE 0.84 06/23/2019   BUN 15 06/23/2019   NA 138 06/23/2019   K 3.7 06/23/2019   CL 94 (L) 06/23/2019   CO2 36 (H) 06/23/2019    Lab Results  Component Value Date   ALT <5 06/23/2019   AST 19 06/23/2019   ALKPHOS 132 (H) 06/23/2019   BILITOT 0.8 06/23/2019     Microbiology: Recent Results (from the  past 240 hour(s))  Respiratory Panel by RT PCR (Flu A&B, Covid) - Nasopharyngeal Swab     Status: None   Collection Time: 06/19/19  8:24 PM   Specimen: Nasopharyngeal Swab  Result Value Ref Range Status   SARS Coronavirus 2 by RT PCR NEGATIVE NEGATIVE Final    Comment: (NOTE) SARS-CoV-2 target nucleic acids are NOT DETECTED. The SARS-CoV-2 RNA is generally detectable in upper respiratoy specimens during the acute phase of infection. The lowest concentration of SARS-CoV-2 viral copies this assay can detect is 131 copies/mL. A negative result does not preclude SARS-Cov-2 infection and should not be used as the sole basis for treatment or other patient management decisions. A negative result may occur with  improper specimen collection/handling, submission of specimen other than nasopharyngeal swab, presence of viral mutation(s) within the areas targeted by this assay, and inadequate number of viral copies (<131 copies/mL). A negative result must be combined with clinical observations, patient history, and epidemiological information. The expected result is Negative. Fact Sheet for Patients:  PinkCheek.be Fact Sheet for Healthcare Providers:  GravelBags.it This test is not yet ap proved or cleared by the [place] FDA and  has been authorized for detection and/or diagnosis of SARS-CoV-2 by FDA under an Emergency Use Authorization (EUA). This EUA will remain  in effect (meaning this test can be used) for the duration of the COVID-19 declaration under Section 564(b)(1) of the Act, 21 U.S.C. section 360bbb-3(b)(1), unless the authorization is terminated or revoked sooner.    Influenza A by PCR NEGATIVE NEGATIVE Final   Influenza B by PCR NEGATIVE NEGATIVE Final    Comment: (NOTE) The Xpert Xpress SARS-CoV-2/FLU/RSV assay is intended as an aid in  the diagnosis of influenza from Nasopharyngeal swab specimens and  should not be  used as a sole basis for treatment. Nasal washings and  aspirates are unacceptable for Xpert Xpress SARS-CoV-2/FLU/RSV  testing. Fact Sheet for Patients: PinkCheek.be Fact Sheet for Healthcare Providers: GravelBags.it This test is not yet approved or cleared by the [place] FDA and  has been authorized for detection and/or diagnosis of SARS-CoV-2 by  FDA under an Emergency Use Authorization (EUA). This EUA will remain  in effect (meaning this test can be used) for the duration of the  Covid-19 declaration under Section 564(b)(1) of the Act, 21  U.S.C. section 360bbb-3(b)(1), unless the authorization is  terminated or revoked. Performed at [facility], [place] [address], [place],  [address]  Culture, blood (routine x 2)     Status: None (Preliminary result)   Collection Time: 06/19/19 11:25 PM   Specimen: BLOOD  Result Value Ref Range Status   Specimen Description BLOOD RIGHT ARM  Final   Special Requests   Final    BOTTLES DRAWN AEROBIC AND ANAEROBIC Blood Culture adequate volume Performed at [facility], [place] [address] [place], [place] [address]    Culture NO GROWTH 4 DAYS  Final   Report Status PENDING  Incomplete  Culture, blood (routine x 2)     Status: None (Preliminary result)   Collection Time: 06/19/19 11:30 PM   Specimen: BLOOD  Result Value Ref Range Status   Specimen Description BLOOD LEFT HAND  Final   Special Requests   Final    BOTTLES DRAWN AEROBIC AND ANAEROBIC Blood Culture adequate volume Performed at [facility], [place] [address], [place], [place] [address]    Culture NO GROWTH 4 DAYS  Final   Report Status PENDING  Incomplete  MRSA PCR Screening     Status: None   Collection Time: 06/20/19  8:59 AM   Specimen: Nasal Mucosa; Nasopharyngeal  Result Value Ref Range Status   MRSA by PCR NEGATIVE NEGATIVE Final    Comment:        The GeneXpert MRSA Assay (FDA approved  for NASAL specimens only), is one component of a comprehensive MRSA colonization surveillance program. It is not intended to diagnose MRSA infection nor to guide or monitor treatment for MRSA infections. Performed at [facility], [place] [address] [place], [place] [address]   Gram stain     Status: None   Collection Time: 06/20/19 10:56 AM   Specimen: PATH Cytology Pleural fluid  Result Value Ref Range Status   Specimen Description FLUID  Final   Special Requests NONE  Final   Gram Stain   Final    CYTOSPIN SMEAR WBC PRESENT, PREDOMINANTLY MONONUCLEAR NO ORGANISMS SEEN Performed at [facility], [place] [address], [place], [place] [address]    Report Status 06/20/2019 FINAL  Final  Culture, body fluid-bottle     Status: None (Preliminary result)   Collection Time: 06/20/19 10:58 AM   Specimen: Fluid  Result Value Ref Range Status   Specimen Description FLUID  Final   Special Requests NONE  Final   Culture NO GROWTH 3 DAYS  Final   Report Status PENDING  Incomplete    Michel Bickers, MD [facility] [phone] pager   [phone] cell 06/23/2019, 10:19 AM

## 2019-06-24 ENCOUNTER — Inpatient Hospital Stay (HOSPITAL_COMMUNITY): Payer: Medicare PPO

## 2019-06-24 DIAGNOSIS — I5021 Acute systolic (congestive) heart failure: Secondary | ICD-10-CM

## 2019-06-24 DIAGNOSIS — E872 Acidosis, unspecified: Secondary | ICD-10-CM

## 2019-06-24 LAB — CULTURE, BLOOD (ROUTINE X 2)
Culture: NO GROWTH
Culture: NO GROWTH
Special Requests: ADEQUATE
Special Requests: ADEQUATE

## 2019-06-24 LAB — CBC WITH DIFFERENTIAL/PLATELET
Abs Immature Granulocytes: 0.05 10*3/uL (ref 0.00–0.07)
Basophils Absolute: 0.1 10*3/uL (ref 0.0–0.1)
Basophils Relative: 1 %
Eosinophils Absolute: 0.4 10*3/uL (ref 0.0–0.5)
Eosinophils Relative: 4 %
HCT: 29.9 % — ABNORMAL LOW (ref 36.0–46.0)
Hemoglobin: 9.3 g/dL — ABNORMAL LOW (ref 12.0–15.0)
Immature Granulocytes: 1 %
Lymphocytes Relative: 37 %
Lymphs Abs: 3.6 10*3/uL (ref 0.7–4.0)
MCH: 32.5 pg (ref 26.0–34.0)
MCHC: 31.1 g/dL (ref 30.0–36.0)
MCV: 104.5 fL — ABNORMAL HIGH (ref 80.0–100.0)
Monocytes Absolute: 0.8 10*3/uL (ref 0.1–1.0)
Monocytes Relative: 8 %
Neutro Abs: 4.9 10*3/uL (ref 1.7–7.7)
Neutrophils Relative %: 49 %
Platelets: 331 10*3/uL (ref 150–400)
RBC: 2.86 MIL/uL — ABNORMAL LOW (ref 3.87–5.11)
RDW: 16.5 % — ABNORMAL HIGH (ref 11.5–15.5)
WBC: 9.8 10*3/uL (ref 4.0–10.5)
nRBC: 0 % (ref 0.0–0.2)

## 2019-06-24 LAB — COMPREHENSIVE METABOLIC PANEL
ALT: 5 U/L (ref 0–44)
AST: 20 U/L (ref 15–41)
Albumin: 2 g/dL — ABNORMAL LOW (ref 3.5–5.0)
Alkaline Phosphatase: 123 U/L (ref 38–126)
Anion gap: 9 (ref 5–15)
BUN: 13 mg/dL (ref 8–23)
CO2: 38 mmol/L — ABNORMAL HIGH (ref 22–32)
Calcium: 8 mg/dL — ABNORMAL LOW (ref 8.9–10.3)
Chloride: 87 mmol/L — ABNORMAL LOW (ref 98–111)
Creatinine, Ser: 0.61 mg/dL (ref 0.44–1.00)
GFR calc Af Amer: >60 mL/min (ref >60)
GFR calc non Af Amer: >60 mL/min (ref >60)
Glucose, Bld: 98 mg/dL (ref 70–99)
Potassium: 3.4 mmol/L — ABNORMAL LOW (ref 3.5–5.1)
Sodium: 134 mmol/L — ABNORMAL LOW (ref 135–145)
Total Bilirubin: 0.8 mg/dL (ref 0.3–1.2)
Total Protein: 5.9 g/dL — ABNORMAL LOW (ref 6.5–8.1)

## 2019-06-24 LAB — PHOSPHORUS: Phosphorus: 3.2 mg/dL (ref 2.5–4.6)

## 2019-06-24 LAB — MAGNESIUM: Magnesium: 1.7 mg/dL (ref 1.7–2.4)

## 2019-06-24 MED ORDER — POTASSIUM CHLORIDE CRYS ER 20 MEQ PO TBCR
40.0000 meq | EXTENDED_RELEASE_TABLET | Freq: Two times a day (BID) | ORAL | Status: AC
  Administered 2019-06-24 (×2): 40 meq via ORAL
  Filled 2019-06-24 (×2): qty 2

## 2019-06-24 MED ORDER — MAGNESIUM SULFATE 2 GM/50ML IV SOLN
2.0000 g | Freq: Once | INTRAVENOUS | Status: AC
  Administered 2019-06-24: 2 g via INTRAVENOUS
  Filled 2019-06-24: qty 50

## 2019-06-24 NOTE — Progress Notes (Signed)
PROGRESS NOTE    Samantha Keller  CHE:[id number] DOB: 1945/11/22 DOA: 06/19/2019 PCP: Patient, [name]  Brief Narrative:  HPI per Dr. Christia Reading Opyd 06/19/19 Samantha Keller is a 74 y.o. female with medical history significant for rheumatoid arthritis previously on Humira, chronic pain, and complicated hospitalization in [place] beginning in late January and involving MSSA bacteremia, now presenting to the ED for evaluation of concerning CT chest findings.  Patient had CT chest performed today with large bilateral pleural effusions, complete collapse of left lower lobe, partial collapse of the lingula, and spiculated nodule in the right upper lobe.  She was directed to the ED for evaluation of this.  Patient reports chronic dyspnea and cough but has not appreciated any acute change and denies any recent fevers, chills, chest pain, or palpitations.  Patient had a complicated hospitalization beginning in late January when she presented with fevers, confusion, and hypoxia, and was found to be hyponatremic with AKI, right upper lobe mass concerning for pneumonia versus cancer, MSSA bacteremia, lower extremity cellulitis with abscess, and upper GI bleed.  She had TEE negative for vegetation, had vegetation at the tip of her central line, underwent I&D abscess, and was eventually discharged to a local SNF with PICC for long course of cefazolin.  She has since established with local GI, pulmonology, and ID, and had an outpatient CT chest performed today with findings that prompted her current presentation.  ED Course: Upon arrival to the ED, patient is found to be afebrile, saturating 100% on 2 L/min of supplemental oxygen, slightly tachycardic, and with stable blood pressure.  EKG features sinus rhythm with LVH, secondary repolarization abnormality, and QTc interval 501 ms.  Chemistry panel notable for albumin of 1.8.  CBC with hemoglobin of 9.7 and MCV 103.3.  BNP is elevated to 1844.   Troponin and INR are normal.  Infectious disease was consulted by the ED physician, influenza and Covid PCR are in process, and hospitalist asked to admit.  **Interim History  Patient continues to be significantly volume overloaded and states that her shortness of breath is little bit better but still has some.    Further worked-up her anasarca and given that she had an elevated BNP ordered echocardiogram and lower extremity venous duplex to rule out DVT.  Echocardiogram showed an EF of 20 to 25% and from review of outside records from the hospital she had an EF of 50 to 55% in January.  Cardiology was consulted for her new systolic CHF and they recommend against cath at this time and recommending medical management and diuresis and they have started her on losartan.  LE Venous duplex was negative for DVT.  Patient is -12.036 Liters now and weight is down 23 lbs since admission. Is continuing to be diuresed with IV Lasix 60 mg and Cardiology considering adding Spironolactone but have held off.  Her beta-blocker was initially stopped stopped reinitiated by cardiology yesterday with metoprolol succinate 25 mg p.o. twice daily.  Assessment & Plan:   Principal Problem:   Large pleural effusion Active Problems:   Bacteremia due to methicillin susceptible Staphylococcus aureus (MSSA)   History of GI bleed   Rheumatoid arthritis (HCC)   Anemia   Nodule of right lung   Anasarca   Protein-calorie malnutrition, mild (HCC)   Pleural effusion  Large pleural effusions; right lung nodule; chronic hypoxic respiratory failure in the setting of acute systolic CHF with a EF of 20-25% - Patient has been on cefazolin  at her SNF for MSSA bacteremia secondary to LE cellulitis vs PNA and had follow-up chest CT and CT Scan showed that the patient was found to have large bilateral pleural effusions with complete LLL collapse, partial collapse of lingula, and spiculated RUL nodule  -She is stable on her usual 2 Lpm  and denies any recent change in chronic cough and dyspnea  -Consulted IR for thoracentesis with fluid analysis including cytology and this was d -Monocyte-macrocytosis fluid was 18 and straw-colored fluid movements and 650 mL was removed  -Repeat chest x-ray 06/24/19 showed "Normal heart size and mediastinal contours. Right PICC with tip at the upper right atrium. Bilateral pleural effusion with borderline improvement on the right. The right effusion appears moderate and the left effusion more small volume. Streaky density at the lung bases. No visible pneumothorax." -Echocardiogram was ordered to rule out Cardiac Causes and showed "Left ventricular ejection fraction, by estimation, is 20 to 25%. The Left ventricle has severely decreased function. The left ventricle demonstrates global hypokinesis. Indeterminate diastolic filling due to E-A fusion.  Right ventricular systolic function is mildly reduced. The right  ventricular size is moderately enlarged. There is moderately elevated  pulmonary artery systolic pressure. The estimated right ventricular  systolic pressure is 41.6 mmHg. Right atrial size was mild to moderately dilated." -Gven albumin and Lasix combination x1 initially and then started Scheduled Diuresis with IV Lasix 40 mg q12h and Cardiology was consulted and increased this to 60 mg IV BID -Strict I's and O's and daily weights; She is -12.036 Liters since admission and Weight is normally 115 and she is 160.5 on admission and on recheck today she was 137 lbs  -BP was elevated on admission at 1843 and does have CHF -Repeat chest x-ray in a.m.   MSSA Bacteremia   - Admitted to a hospital in [place] in late January with MSSA bacteremia, suspected PNA, and LE cellulitis with abscess  - She had TEE without vegetation; there was vegetation at tip of central line  - She has been on cefazolin with end date 06/25/2019; she is also on doxycycline  - ID consulted by ED physician and much appreciate   - No fever or leukocytosis on admission  -Continue IV Cefazolin and po Doxycycline per ID  -Repeat blood cultures and showed no growth to date at 5 Days (06/19/19) and repeat Body Fluid Cx showed NGTD at 4 Days -She underwent a thoracentesis this admission  -Repeat chest x-ray intermittently   Metabolic Alkalosis -In the setting of Diuresis  -Due to 1.29 is now 38 today -Wll need to monitor carefully given that she is being diuresed may need to add some acetazolamide but will defer to Cardiology    Macrocytic Anemia; recent UGIB  -Hgb/Hct went from 9.7/31.5 on admission and is now 9.3/29.9 -MCV was 104.5 -She is established with local GI and planned for EGD in April  -Continue Pantoprazole 40 mg po BID   -Need to monitor for signs and symptoms of bleeding; currently no overt bleeding noted -Repeat CBC in a.m.  Rheumatoid Arthritis Chronic Pain  -Continue Duloxetine 60 mg po Daily, Gabapentin 600 mg po TID and Baclofen 10 mg po TID Daily PRN  Anasarca and volume overload likely multifactorial in the setting of poor nutritional status and acute systolic CHF with an EF of 20 to 25% -In the setting of poor nutritional status and low albumin 1.8 and prealbumin 9.8 and now acute systolic CHF -Consult nutritionist and they recommended a speech eval  because the patient was on pured diet; SLP has been called the diet will remain on mechanical soft with thin liquids -Give albumin 25 g as well as IV Lasix 40 mg and see volume response initially but was started on scheduled diuresis with IV 40 mg of Lasix and cardiology increase this to IV 60 mg BID  -Checked echocardiogram and lower extremity venous duplex to rule out DVT -BNP was elevated  -Continue with Ensure Enlive p.o. twice daily as well as a multivitamin daily -Unlikely the patient's anasarca is related to nephrotic syndrome given the lack of proteinuria on her urinalysis -LE Venous Duplex to r/o DVT and this was negative -Cardiology  was consulted for further evaluation recommendations and appreciate their assistance and Dr. Harl Bowie feels that she remains massively volume overloaded and recommends to continue to hold beta-blocker until more euvolemic and start losartan 12.5 mg p.o. daily and are considering adding Spironolactone -Metoprolol 25 mg po BID was on board but stopped until Cardiology felt that she was a little more Euvolemic and now it has been resumed by Dr. Harrington Challenger and is Metoprolol Succinate 25 mg po BID  -Cardiology feels that she will require several days of IV diuresis and will continue Diuresis per Card Recc's -Monitor volume status carefully and repeat chest x-ray in a.m.  Hypomagnesemia -Patient's magnesium level was 1.7 -Replete with IV mag sulfate 2 g again  -Continue to monitor and replete as necessary -Repeat magnesium level in the a.m.  Hypokalemia -Patient's potassium was 3.0 and is now 3.4 -Likely in the setting of IV diuresis  -Replete with po KCl 40 mEQ BID -likely will require scheduled potassium replacement  -continue to monitor and replete as necessary -Repeat CMP in AM   Elevated TSH -TSH was 10.903 -Likely in the setting of her infection and reactive -Free T4 was normal at 0.93 -Recommend repeating thyroid function studies in 4 to 6 weeks in outpatient setting  DVT prophylaxis: Enoxaparin 40 mg sq q24h Code Status: FULL CODE  Family Communication: No family present at bedside  Disposition Plan: The patient is from SNF and likely will go back to SNF once her volume status improves and she is diuresed back to her dry weight per cardiology recommendations  Consultants:   Infectious Diseases    Procedures:  ECHOCARDIOGRAM IMPRESSIONS    1. Left ventricular ejection fraction, by estimation, is 20 to 25%. The  left ventricle has severely decreased function. The left ventricle  demonstrates global hypokinesis. Indeterminate diastolic filling due to  E-A fusion.  2. Right  ventricular systolic function is mildly reduced. The right  ventricular size is moderately enlarged. There is moderately elevated  pulmonary artery systolic pressure. The estimated right ventricular  systolic pressure is 62.8 mmHg.  3. Right atrial size was mild to moderately dilated.  4. The mitral valve is grossly normal. Mild to moderate mitral valve  regurgitation.  5. Tricuspid valve regurgitation is moderate.  6. The aortic valve is tricuspid. Aortic valve regurgitation is not  visualized. No aortic stenosis is present.  7. The inferior vena cava is dilated in size with <50% respiratory  variability, suggesting right atrial pressure of 15 mmHg.   FINDINGS  Left Ventricle: Left ventricular ejection fraction, by estimation, is 20  to 25%. The left ventricle has severely decreased function. The left  ventricle demonstrates global hypokinesis. The left ventricular internal  cavity size was normal in size. There  is no left ventricular hypertrophy. Abnormal (paradoxical) septal motion,  consistent with left  bundle branch block. Indeterminate diastolic filling  due to E-A fusion.   Right Ventricle: The right ventricular size is moderately enlarged. No  increase in right ventricular wall thickness. Right ventricular systolic  function is mildly reduced. There is moderately elevated pulmonary artery  systolic pressure. The tricuspid  regurgitant velocity is 3.08 m/s, and with an assumed right atrial  pressure of 15 mmHg, the estimated right ventricular systolic pressure is  97.0 mmHg.   Left Atrium: Left atrial size was normal in size.   Right Atrium: Right atrial size was mild to moderately dilated.   Pericardium: There is no evidence of pericardial effusion. Presence of  pericardial fat pad.   Mitral Valve: The mitral valve is grossly normal. Mild to moderate mitral  valve regurgitation.   Tricuspid Valve: The tricuspid valve is grossly normal. Tricuspid valve    regurgitation is moderate.   Aortic Valve: The aortic valve is tricuspid. Aortic valve regurgitation is  not visualized. No aortic stenosis is present.   Pulmonic Valve: The pulmonic valve was grossly normal. Pulmonic valve  regurgitation is not visualized. No evidence of pulmonic stenosis.   Aorta: The aortic root and ascending aorta are structurally normal, with  no evidence of dilitation.   Venous: The inferior vena cava is dilated in size with less than 50%  respiratory variability, suggesting right atrial pressure of 15 mmHg.   IAS/Shunts: The atrial septum is grossly normal.   Additional Comments: A venous catheter is visualized in the right atrium.  There is a small pleural effusion in the left lateral region.     LEFT VENTRICLE  PLAX 2D  LVIDd:     4.50 cm   Diastology  LVIDs:     3.60 cm   LV e' lateral:  6.30 cm/s  LV PW:     1.00 cm   LV E/e' lateral: 12.9  LV IVS:    0.80 cm   LV e' medial:  4.06 cm/s  LVOT diam:   1.90 cm   LV E/e' medial: 20.0  LV SV:     31  LV SV Index:  18  LVOT Area:   2.84 cm    LV Volumes (MOD)  LV vol d, MOD A2C: 79.9 ml  LV vol d, MOD A4C: 72.4 ml  LV vol s, MOD A2C: 51.7 ml  LV vol s, MOD A4C: 45.7 ml  LV SV MOD A2C:   28.2 ml  LV SV MOD A4C:   72.4 ml  LV SV MOD BP:   29.5 ml   RIGHT VENTRICLE  RV S prime:   8.70 cm/s  TAPSE (M-mode): 1.2 cm   LEFT ATRIUM       Index    RIGHT ATRIUM      Index  LA diam:    3.50 cm 2.01 cm/m RA Area:   16.90 cm  LA Vol (A2C):  36.2 ml 20.79 ml/m RA Volume:  50.10 ml 28.78 ml/m  LA Vol (A4C):  41.3 ml 23.72 ml/m  LA Biplane Vol: 38.8 ml 22.29 ml/m  AORTIC VALVE  LVOT Vmax:  70.00 cm/s  LVOT Vmean: 44.400 cm/s  LVOT VTI:  0.108 m    AORTA  Ao Root diam: 2.30 cm  Ao Asc diam: 3.00 cm   MITRAL VALVE        TRICUSPID VALVE  MV Area (PHT): 3.99 cm  TR Peak grad:  37.9 mmHg  MV Decel Time:  190 msec  TR Vmax:    308.00  cm/s  MR Peak grad: 33.6 mmHg  MR Vmax:   290.00 cm/s SHUNTS  MV E velocity: 81.00 cm/s Systemic VTI: 0.11 m  MV A velocity: 72.20 cm/s Systemic Diam: 1.90 cm  MV E/A ratio: 1.12   LE VENOUS DUPLEX BILATERAL:  - No evidence of deep vein thrombosis seen in the lower extremities,  bilaterally.   Antimicrobials:  Anti-infectives (From admission, onward)   Start     Dose/Rate Route Frequency Ordered Stop   06/19/19 2315  doxycycline (VIBRA-TABS) tablet 100 mg     100 mg Oral 2 times daily 06/19/19 2304     06/19/19 2315  ceFAZolin (ANCEF) IVPB 2g/100 mL premix     2 g 200 mL/hr over 30 Minutes Intravenous Every 8 hours 06/19/19 2306       Subjective: Seen and examined at bedside states that she slept okay but they came and woke her up in the middle of the night at 4 AM to give her a bath.  She states that she is not happy about this.  No other concerns or claims at this time and thinks her breathing is stable.  Also thinks that her legs are little bit better today.  No nausea or vomiting.  No other concerns or complaints at this time.  Objective: Vitals:   06/24/19 0000 06/24/19 0300 06/24/19 0409 06/24/19 0851  BP:   (!) 110/53 (!) 105/46  Pulse:   84 87  Resp: 12 14 17 13   Temp:   97.6 F (36.4 C) (!) 97.3 F (36.3 C)  TempSrc:   Oral Axillary  SpO2:   100%   Weight:   62.4 kg   Height:        Intake/Output Summary (Last 24 hours) at 06/24/2019 1146 Last data filed at 06/24/2019 1062 Gross per 24 hour  Intake 480.68 ml  Output 2300 ml  Net -1819.32 ml   Filed Weights   06/20/19 0150 06/23/19 0447 06/24/19 0409  Weight: 72.8 kg 67.5 kg 62.4 kg   Examination: Physical Exam:  Constitutional: WN/WD overweight Caucasian female currently in no acute distress appears calm but still wearing supplemental oxygen via nasal cannula. Eyes: Lids and conjunctivae normal, sclerae anicteric  ENMT: External Ears, Nose appear normal. Grossly  normal hearing. Mucous membranes are moist.  Neck: Appears normal, supple, no cervical masses, normal ROM, no appreciable thyromegaly neck: No JVD Respiratory: Diminished to auscultation bilaterally with coarse breath sounds and bilateral crackles on the basis; no appreciable wheezing, rales, rhonchi. Normal respiratory effort and patient is not tachypenic. No accessory muscle use.  Wearing 2 L of supplemental oxygen via nasal cannula Cardiovascular: RRR, no murmurs / rubs / gallops. S1 and S2 auscultated.  Has 2+ lower extremity pitting edema and continues appears severely volume overloaded Abdomen: Soft, non-tender, distended secondary body habitus. No masses palpated. Bowel sounds positive x4.  GU: Deferred. Musculoskeletal: No clubbing / cyanosis of digits/nails. No joint deformity upper and lower extremities.  Skin: No rashes, lesions, ulcers on limited skin evaluation. No induration; Warm and dry.  Neurologic: CN 2-12 grossly intact with no focal deficits.  Romberg sign and cerebellar reflexes were not assessed  Psychiatric: Normal judgment and insight. Alert and oriented x 3. Normal mood and appropriate affect.    Data Reviewed: I have personally reviewed following labs and imaging studies  CBC: Recent Labs  Lab 06/20/19 0513 06/21/19 0422 06/22/19 0449 06/23/19 0505 06/24/19 0548  WBC 10.1 8.0 8.3 8.4 9.8  NEUTROABS 6.0 3.8 4.0 4.1  4.9  HGB 8.7* 8.3* 8.8* 8.9* 9.3*  HCT 28.4* 27.2* 28.4* 28.9* 29.9*  MCV 102.5* 105.4* 103.3* 103.2* 104.5*  PLT 340 311 316 278 235   Basic Metabolic Panel: Recent Labs  Lab 06/20/19 0513 06/21/19 0422 06/22/19 0449 06/23/19 0505 06/24/19 0548  NA 138 136 136 138 134*  K 3.6 3.4* 3.0* 3.7 3.4*  CL 100 99 94* 94* 87*  CO2 29 29 33* 36* 38*  GLUCOSE 135* 95 98 98 98  BUN 13 13 16 15 13   CREATININE 0.77 0.73 0.70 0.84 0.61  CALCIUM 8.3* 8.1* 8.0* 7.9* 8.0*  MG  --  1.6* 1.6* 1.7 1.7  PHOS  --  3.7 3.5 3.2 3.2   GFR: Estimated  Creatinine Clearance: 55.2 mL/min (by C-G formula based on SCr of 0.61 mg/dL). Liver Function Tests: Recent Labs  Lab 06/20/19 0513 06/21/19 0422 06/22/19 0449 06/23/19 0505 06/24/19 0548  AST 21 39 22 19 20   ALT <5 <5 <5 <5 5  ALKPHOS 131* 154* 144* 132* 123  BILITOT 0.6 0.9 0.7 0.8 0.8  PROT 6.0* 5.9* 5.7* 5.6* 5.9*  ALBUMIN 1.8* 2.2* 2.1* 1.9* 2.0*   No results for input(s): LIPASE, AMYLASE in the last 168 hours. No results for input(s): AMMONIA in the last 168 hours. Coagulation Profile: Recent Labs  Lab 06/19/19 1935  INR 1.1   Cardiac Enzymes: No results for input(s): CKTOTAL, CKMB, CKMBINDEX, TROPONINI in the last 168 hours. BNP (last 3 results) No results for input(s): PROBNP in the last 8760 hours. HbA1C: No results for input(s): HGBA1C in the last 72 hours. CBG: No results for input(s): GLUCAP in the last 168 hours. Lipid Profile: No results for input(s): CHOL, HDL, LDLCALC, TRIG, CHOLHDL, LDLDIRECT in the last 72 hours. Thyroid Function Tests: Recent Labs    06/22/19 0446 06/22/19 0449  TSH  --  10.903*  FREET4 0.93  --    Anemia Panel: No results for input(s): VITAMINB12, FOLATE, FERRITIN, TIBC, IRON, RETICCTPCT in the last 72 hours. Sepsis Labs: Recent Labs  Lab 06/20/19 0513  PROCALCITON <0.10    Recent Results (from the past 240 hour(s))  Respiratory Panel by RT PCR (Flu A&B, Covid) - Nasopharyngeal Swab     Status: None   Collection Time: 06/19/19  8:24 PM   Specimen: Nasopharyngeal Swab  Result Value Ref Range Status   SARS Coronavirus 2 by RT PCR NEGATIVE NEGATIVE Final    Comment: (NOTE) SARS-CoV-2 target nucleic acids are NOT DETECTED. The SARS-CoV-2 RNA is generally detectable in upper respiratoy specimens during the acute phase of infection. The lowest concentration of SARS-CoV-2 viral copies this assay can detect is 131 copies/mL. A negative result does not preclude SARS-Cov-2 infection and should not be used as the sole basis for  treatment or other patient management decisions. A negative result may occur with  improper specimen collection/handling, submission of specimen other than nasopharyngeal swab, presence of viral mutation(s) within the areas targeted by this assay, and inadequate number of viral copies (<131 copies/mL). A negative result must be combined with clinical observations, patient history, and epidemiological information. The expected result is Negative. Fact Sheet for Patients:  PinkCheek.be Fact Sheet for Healthcare Providers:  GravelBags.it This test is not yet ap proved or cleared by the [place] FDA and  has been authorized for detection and/or diagnosis of SARS-CoV-2 by FDA under an Emergency Use Authorization (EUA). This EUA will remain  in effect (meaning this test can be used) for the duration of  the COVID-19 declaration under Section 564(b)(1) of the Act, 21 U.S.C. section 360bbb-3(b)(1), unless the authorization is terminated or revoked sooner.    Influenza A by PCR NEGATIVE NEGATIVE Final   Influenza B by PCR NEGATIVE NEGATIVE Final    Comment: (NOTE) The Xpert Xpress SARS-CoV-2/FLU/RSV assay is intended as an aid in  the diagnosis of influenza from Nasopharyngeal swab specimens and  should not be used as a sole basis for treatment. Nasal washings and  aspirates are unacceptable for Xpert Xpress SARS-CoV-2/FLU/RSV  testing. Fact Sheet for Patients: PinkCheek.be Fact Sheet for Healthcare Providers: GravelBags.it This test is not yet approved or cleared by the [place] FDA and  has been authorized for detection and/or diagnosis of SARS-CoV-2 by  FDA under an Emergency Use Authorization (EUA). This EUA will remain  in effect (meaning this test can be used) for the duration of the  Covid-19 declaration under Section 564(b)(1) of the Act, 21  U.S.C. section  360bbb-3(b)(1), unless the authorization is  terminated or revoked. Performed at [facility], [place] [address], [place], [place] [address]   Culture, blood (routine x 2)     Status: None   Collection Time: 06/19/19 11:25 PM   Specimen: BLOOD  Result Value Ref Range Status   Specimen Description BLOOD RIGHT ARM  Final   Special Requests   Final    BOTTLES DRAWN AEROBIC AND ANAEROBIC Blood Culture adequate volume   Culture   Final    NO GROWTH 5 DAYS Performed at [facility], [address] [place], [place] [address]    Report Status 06/24/2019 FINAL  Final  Culture, blood (routine x 2)     Status: None   Collection Time: 06/19/19 11:30 PM   Specimen: BLOOD  Result Value Ref Range Status   Specimen Description BLOOD LEFT HAND  Final   Special Requests   Final    BOTTLES DRAWN AEROBIC AND ANAEROBIC Blood Culture adequate volume   Culture   Final    NO GROWTH 5 DAYS Performed at [facility], [place] [address] [place], [place] [address]    Report Status 06/24/2019 FINAL  Final  MRSA PCR Screening     Status: None   Collection Time: 06/20/19  8:59 AM   Specimen: Nasal Mucosa; Nasopharyngeal  Result Value Ref Range Status   MRSA by PCR NEGATIVE NEGATIVE Final    Comment:        The GeneXpert MRSA Assay (FDA approved for NASAL specimens only), is one component of a comprehensive MRSA colonization surveillance program. It is not intended to diagnose MRSA infection nor to guide or monitor treatment for MRSA infections. Performed at [facility], [place] [address] [place], [place] [address]   Gram stain     Status: None   Collection Time: 06/20/19 10:56 AM   Specimen: PATH Cytology Pleural fluid  Result Value Ref Range Status   Specimen Description FLUID  Final   Special Requests NONE  Final   Gram Stain   Final    CYTOSPIN SMEAR WBC PRESENT, PREDOMINANTLY MONONUCLEAR NO ORGANISMS SEEN Performed at [facility], [place] [address],  [place], [place] [address]    Report Status 06/20/2019 FINAL  Final  Culture, body fluid-bottle     Status: None (Preliminary result)   Collection Time: 06/20/19 10:58 AM   Specimen: Fluid  Result Value Ref Range Status   Specimen Description FLUID  Final   Special Requests NONE  Final   Culture   Final    NO GROWTH 4 DAYS Performed at [facility], [place] [address] [place], [place] [address]    Report Status PENDING  Incomplete     RN Pressure Injury Documentation:     Estimated body mass index is 25.16 kg/m as calculated from the following:   Height as of this encounter: 5\' 2"  (1.575 m).   Weight as of this encounter: 62.4 kg.  Malnutrition Type:  Nutrition Problem: Inadequate oral intake Etiology: decreased appetite   Malnutrition Characteristics:  Signs/Symptoms: per patient/family report   Nutrition Interventions:  Interventions: Ensure Enlive (each supplement provides 350kcal and 20 grams of protein), MVI   Radiology Studies: DG CHEST PORT 1 VIEW  Result Date: 06/24/2019 CLINICAL DATA:  Shortness of breath EXAM: PORTABLE CHEST 1 VIEW COMPARISON:  Yesterday FINDINGS: Normal heart size and mediastinal contours. Right PICC with tip at the upper right atrium. Bilateral pleural effusion with borderline improvement on the right. The right effusion appears moderate and the left effusion more small volume. Streaky density at the lung bases. No visible pneumothorax IMPRESSION: Bilateral pleural effusion that are stable to mildly decreased. No new abnormality. Electronically Signed   By: Monte Fantasia M.D.   On: 06/24/2019 08:33   DG CHEST PORT 1 VIEW  Result Date: 06/23/2019 CLINICAL DATA:  Shortness of breath, large pleural effusion. EXAM: PORTABLE CHEST 1 VIEW COMPARISON:  06/21/2019 and CT chest 06/19/2019. FINDINGS: Right PICC terminates in the low SVC or at the SVC RA junction. Trachea is midline. Heart size stable. There are bilateral pleural effusions, large on the  right and moderate on the left, similar to 06/21/2019. There may be mild mid and lower lung zone interstitial prominence and indistinctness. Right humeral head is high riding and there are degenerative changes in the right shoulder. IMPRESSION: 1. Bilateral pleural effusions, moderate on the right and small on the left, similar. 2. Suspect mild pulmonary edema with bibasilar atelectasis. Electronically Signed   By: Lorin Picket M.D.   On: 06/23/2019 07:56   Scheduled Meds:  Chlorhexidine Gluconate Cloth  6 each Topical Daily   cycloSPORINE  1 drop Both Eyes BID   doxycycline  100 mg Oral BID   DULoxetine  60 mg Oral Daily   enoxaparin (LOVENOX) injection  40 mg Subcutaneous Q24H   feeding supplement (ENSURE ENLIVE)  237 mL Oral BID BM   folic acid  2 mg Oral Daily   furosemide  60 mg Intravenous Q12H   gabapentin  600 mg Oral TID   losartan  12.5 mg Oral Daily   melatonin  6 mg Oral QHS   metoprolol succinate  25 mg Oral BID WC   multivitamin with minerals  1 tablet Oral Daily   pantoprazole  40 mg Oral BID   potassium chloride  40 mEq Oral BID   sodium chloride flush  3 mL Intravenous Q12H   sodium chloride flush  3 mL Intravenous Q12H   Continuous Infusions:  sodium chloride      ceFAZolin (ANCEF) IV 2 g (06/24/19 0521)   magnesium sulfate bolus IVPB 2 g (06/24/19 1050)    LOS: 4 days    Kerney Elbe, DO [facility] PAGER is on AMION  If 7PM-7AM, please contact night-coverage [email or web address]

## 2019-06-24 NOTE — Progress Notes (Signed)
  Speech Language Pathology Treatment: Dysphagia  Patient Details Name: Samantha Keller MRN: [id number] DOB: 08-Apr-1945 Today's Date: 06/24/2019 Time: 4033-5331 SLP Time Calculation (min) (ACUTE ONLY): 10 min  Assessment / Plan / Recommendation Clinical Impression  F/u for dysphagia, which has resolved. Pt independent with regular food trials and thin liquids with adequate and timely mastication, brisk swallow response, no s/s of aspiration.  No further SLP f/u is needed.  Recommend regular consistency diet, thin liquids, meds whole in liquids.  SLP to sign off.   HPI HPI: 74 y.o. female with medical history significant for rheumatoid arthritis previously on Humira, chronic pain, and complicated hospitalization in [place] beginning in late January and involving MSSA bacteremia, now presenting to the ED for evaluation of concerning CT chest findings.  Patient had CT chest performed today with large bilateral pleural effusions, complete collapse of left lower lobe, partial collapse of the lingula, and spiculated nodule in the right upper lobe.       SLP Plan  All goals met       Recommendations  Diet recommendations: Regular;Thin liquid Liquids provided via: Cup;Straw Medication Administration: Whole meds with liquid Supervision: Patient able to self feed                Oral Care Recommendations: Oral care BID Follow up Recommendations: None SLP Visit Diagnosis: Dysphagia, unspecified (R13.10) Plan: All goals met       GO                Juan Quam Laurice 06/24/2019, 12:06 PM Synia Douglass L. Tivis Ringer, [place] Office number [phone] Pager [phone]

## 2019-06-24 NOTE — TOC Progression Note (Signed)
Transition of Care [facility]) - Progression Note    Patient Details  Name: Samantha Keller MRN: [id number] Date of Birth: [birth year]  [facility]) CM/SW [place], [place] Phone Number: 06/24/2019, 12:15 PM  Clinical Narrative:     CSW met with the patient at bedside. CSW introduced self and explained role. Patient confirmed she was from [facility]. Patient states she has been at [facility] for a few weeks. Patient states she plan to return to [facility] once she is medically stable.  Patient states no questions or concerns at this time.   CSW will continue to follow and assist with discharged planning.   Thurmond Butts, MSW, [place] Clinical Social Worker   Expected Discharge Plan: Skilled Nursing Facility Barriers to Discharge: Continued Medical Work up  Expected Discharge Plan and Services Expected Discharge Plan: [place] In-house Referral: Clinical Social Work                                             Social Determinants of Health (SDOH) Interventions    Readmission Risk Interventions No flowsheet data found.

## 2019-06-24 NOTE — Progress Notes (Signed)
Progress Note  Patient Name: Samantha Keller Date of Encounter: 06/24/2019  Primary Cardiologist: New  Subjective   No SOB  No CP    Inpatient Medications    Scheduled Meds: . Chlorhexidine Gluconate Cloth  6 each Topical Daily  . cycloSPORINE  1 drop Both Eyes BID  . doxycycline  100 mg Oral BID  . DULoxetine  60 mg Oral Daily  . enoxaparin (LOVENOX) injection  40 mg Subcutaneous Q24H  . feeding supplement (ENSURE ENLIVE)  237 mL Oral BID BM  . folic acid  2 mg Oral Daily  . furosemide  60 mg Intravenous Q12H  . gabapentin  600 mg Oral TID  . losartan  12.5 mg Oral Daily  . melatonin  6 mg Oral QHS  . metoprolol succinate  25 mg Oral BID WC  . multivitamin with minerals  1 tablet Oral Daily  . pantoprazole  40 mg Oral BID  . potassium chloride  40 mEq Oral BID  . sodium chloride flush  3 mL Intravenous Q12H  . sodium chloride flush  3 mL Intravenous Q12H   Continuous Infusions: . sodium chloride    .  ceFAZolin (ANCEF) IV 2 g (06/24/19 0521)  . magnesium sulfate bolus IVPB     PRN Meds: sodium chloride, acetaminophen **OR** acetaminophen, baclofen, lidocaine, polyethylene glycol, sodium chloride flush   Vital Signs    Vitals:   06/24/19 0000 06/24/19 0300 06/24/19 0409 06/24/19 0851  BP:   (!) 110/53 (!) 105/46  Pulse:   84 87  Resp: 12 14 17 13   Temp:   97.6 F (36.4 C) (!) 97.3 F (36.3 C)  TempSrc:   Oral Axillary  SpO2:   100%   Weight:   62.4 kg   Height:        Intake/Output Summary (Last 24 hours) at 06/24/2019 0957 Last data filed at 06/24/2019 2836 Gross per 24 hour  Intake 480.68 ml  Output 3400 ml  Net -2919.32 ml   Net neg 12 L L   Last 3 Weights 06/24/2019 06/23/2019 06/20/2019  Weight (lbs) 137 lb 9.1 oz 148 lb 13 oz 160 lb 7.9 oz  Weight (kg) 62.4 kg 67.5 kg 72.8 kg      Telemetry    SR - Personally Reviewed  ECG    No EKG- Personally Reviewed  Physical Exam   GEN: Thin 74 yo in no acute distress.   Neck: JVP is normal     Cardiac: RRR, no murmurs, rubs, or gallops.  Respiratory: coarse bilaterally GI: Soft, nontender, non-distended  MS: 1+ bilateral LE edema; No deformity. Neuro:  Nonfocal  Psych: Normal affect   Labs    High Sensitivity Troponin:   Recent Labs  Lab 06/19/19 1935  TROPONINIHS 12      Chemistry Recent Labs  Lab 06/22/19 0449 06/23/19 0505 06/24/19 0548  NA 136 138 134*  K 3.0* 3.7 3.4*  CL 94* 94* 87*  CO2 33* 36* 38*  GLUCOSE 98 98 98  BUN 16 15 13   CREATININE 0.70 0.84 0.61  CALCIUM 8.0* 7.9* 8.0*  PROT 5.7* 5.6* 5.9*  ALBUMIN 2.1* 1.9* 2.0*  AST 22 19 20   ALT <5 <5 5  ALKPHOS 144* 132* 123  BILITOT 0.7 0.8 0.8  GFRNONAA >60 >60 >60  GFRAA >60 >60 >60  ANIONGAP 9 8 9      Hematology Recent Labs  Lab 06/22/19 0449 06/23/19 0505 06/24/19 0548  WBC 8.3 8.4 9.8  RBC 2.75*  2.80* 2.86*  HGB 8.8* 8.9* 9.3*  HCT 28.4* 28.9* 29.9*  MCV 103.3* 103.2* 104.5*  MCH 32.0 31.8 32.5  MCHC 31.0 30.8 31.1  RDW 16.7* 16.5* 16.5*  PLT 316 278 331    BNP Recent Labs  Lab 06/19/19 1940  BNP 1,843.9*     DDimer No results for input(s): DDIMER in the last 168 hours.   Radiology    DG CHEST PORT 1 VIEW  Result Date: 06/24/2019 CLINICAL DATA:  Shortness of breath EXAM: PORTABLE CHEST 1 VIEW COMPARISON:  Yesterday FINDINGS: Normal heart size and mediastinal contours. Right PICC with tip at the upper right atrium. Bilateral pleural effusion with borderline improvement on the right. The right effusion appears moderate and the left effusion more small volume. Streaky density at the lung bases. No visible pneumothorax IMPRESSION: Bilateral pleural effusion that are stable to mildly decreased. No new abnormality. Electronically Signed   By: Monte Fantasia M.D.   On: 06/24/2019 08:33   DG CHEST PORT 1 VIEW  Result Date: 06/23/2019 CLINICAL DATA:  Shortness of breath, large pleural effusion. EXAM: PORTABLE CHEST 1 VIEW COMPARISON:  06/21/2019 and CT chest 06/19/2019.  FINDINGS: Right PICC terminates in the low SVC or at the SVC RA junction. Trachea is midline. Heart size stable. There are bilateral pleural effusions, large on the right and moderate on the left, similar to 06/21/2019. There may be mild mid and lower lung zone interstitial prominence and indistinctness. Right humeral head is high riding and there are degenerative changes in the right shoulder. IMPRESSION: 1. Bilateral pleural effusions, moderate on the right and small on the left, similar. 2. Suspect mild pulmonary edema with bibasilar atelectasis. Electronically Signed   By: Lorin Picket M.D.   On: 06/23/2019 07:56    Cardiac Studies     Patient Profile     Samantha Keller is a 74 y.o. female with a hx of rheumatoid arthritis, recent extended admission in [place]  with MSSA bacteremia,  who is being seen today for the evaluation of acute HF at the request of Dr . Alfredia Ferguson   Assessment & Plan    1. Acute systolic HF - - 06/8248 echo LVEF 20-25%, mild RV dysfunction, PASP 53 - - BNP 1800 on admission. - Pt has diuresed 12 L   - - history of prior GI bleed with progressing anemia this admission, no plans for inpatient cath. Also lung nodule and recent issues with MSSA bacteremia on abx. Quite a few things to resolve prior to cath, no urgent indications and could be done as outpatient down the road.  She is diuresiing    Volume has improved   2. Hypokalemia/Hypomagnesemia - COntinue to replete  And follow     3. High TSH/normal Free T4 - per primary team  Plan for f/u in near future   4  Anemia  Hgb 9.6  Macrocytic    5  Malnutrition   Alb 2       Nutrition seeing    For questions or updates, please contact [place] Please consult [email or web address] for contact info under        Signed, [name], MD  06/24/2019, 9:57 AM

## 2019-06-25 ENCOUNTER — Telehealth: Payer: Self-pay

## 2019-06-25 ENCOUNTER — Inpatient Hospital Stay (HOSPITAL_COMMUNITY): Payer: Medicare PPO

## 2019-06-25 DIAGNOSIS — L0291 Cutaneous abscess, unspecified: Secondary | ICD-10-CM

## 2019-06-25 DIAGNOSIS — R0600 Dyspnea, unspecified: Secondary | ICD-10-CM

## 2019-06-25 DIAGNOSIS — E440 Moderate protein-calorie malnutrition: Secondary | ICD-10-CM | POA: Insufficient documentation

## 2019-06-25 DIAGNOSIS — L02415 Cutaneous abscess of right lower limb: Secondary | ICD-10-CM

## 2019-06-25 DIAGNOSIS — J15211 Pneumonia due to Methicillin susceptible Staphylococcus aureus: Secondary | ICD-10-CM

## 2019-06-25 LAB — COMPREHENSIVE METABOLIC PANEL
ALT: <5 U/L (ref 0–44)
AST: 27 U/L (ref 15–41)
Albumin: 1.9 g/dL — ABNORMAL LOW (ref 3.5–5.0)
Alkaline Phosphatase: 128 U/L — ABNORMAL HIGH (ref 38–126)
Anion gap: 6 (ref 5–15)
BUN: 13 mg/dL (ref 8–23)
CO2: 37 mmol/L — ABNORMAL HIGH (ref 22–32)
Calcium: 8.1 mg/dL — ABNORMAL LOW (ref 8.9–10.3)
Chloride: 87 mmol/L — ABNORMAL LOW (ref 98–111)
Creatinine, Ser: 0.85 mg/dL (ref 0.44–1.00)
GFR calc Af Amer: >60 mL/min (ref >60)
GFR calc non Af Amer: >60 mL/min (ref >60)
Glucose, Bld: 95 mg/dL (ref 70–99)
Potassium: 4 mmol/L (ref 3.5–5.1)
Sodium: 130 mmol/L — ABNORMAL LOW (ref 135–145)
Total Bilirubin: 0.8 mg/dL (ref 0.3–1.2)
Total Protein: 5.7 g/dL — ABNORMAL LOW (ref 6.5–8.1)

## 2019-06-25 LAB — CBC WITH DIFFERENTIAL/PLATELET
Abs Immature Granulocytes: 0.06 10*3/uL (ref 0.00–0.07)
Basophils Absolute: 0 10*3/uL (ref 0.0–0.1)
Basophils Relative: 1 %
Eosinophils Absolute: 0.4 10*3/uL (ref 0.0–0.5)
Eosinophils Relative: 5 %
HCT: 28.7 % — ABNORMAL LOW (ref 36.0–46.0)
Hemoglobin: 9.1 g/dL — ABNORMAL LOW (ref 12.0–15.0)
Immature Granulocytes: 1 %
Lymphocytes Relative: 39 %
Lymphs Abs: 3.4 10*3/uL (ref 0.7–4.0)
MCH: 33 pg (ref 26.0–34.0)
MCHC: 31.7 g/dL (ref 30.0–36.0)
MCV: 104 fL — ABNORMAL HIGH (ref 80.0–100.0)
Monocytes Absolute: 0.8 10*3/uL (ref 0.1–1.0)
Monocytes Relative: 10 %
Neutro Abs: 4 10*3/uL (ref 1.7–7.7)
Neutrophils Relative %: 44 %
Platelets: 325 10*3/uL (ref 150–400)
RBC: 2.76 MIL/uL — ABNORMAL LOW (ref 3.87–5.11)
RDW: 16.5 % — ABNORMAL HIGH (ref 11.5–15.5)
WBC: 8.8 10*3/uL (ref 4.0–10.5)
nRBC: 0 % (ref 0.0–0.2)

## 2019-06-25 LAB — CULTURE, BODY FLUID W GRAM STAIN -BOTTLE: Culture: NO GROWTH

## 2019-06-25 LAB — MAGNESIUM: Magnesium: 1.8 mg/dL (ref 1.7–2.4)

## 2019-06-25 LAB — PHOSPHORUS: Phosphorus: 3.3 mg/dL (ref 2.5–4.6)

## 2019-06-25 NOTE — Telephone Encounter (Signed)
Thanks

## 2019-06-25 NOTE — Telephone Encounter (Signed)
Received call today from [place] regarding CT results.  ADDENDUM: Prior images which include only plain film evaluations of the chest show evidence of effusions and basilar airspace disease with similar appearance. The CT comparison for the pulmonary nodule in the right upper lobe is not available to determine whether this is diminishing in size. Would suggest retrieval of prior images and short interval follow-up in 6-8 weeks from today's study which would be approximately 3 months from the initial exam. Comparison could be made to the more remote exams at that time Will forward message to MD Aundria Rud, [place]

## 2019-06-25 NOTE — Progress Notes (Signed)
Progress Note  Patient Name: Samantha Keller Date of Encounter: 06/25/2019  Primary Cardiologist: New  Subjective   Pt denies CP  Breathing is OK at rest on 2L    Inpatient Medications    Scheduled Meds: . Chlorhexidine Gluconate Cloth  6 each Topical Daily  . cycloSPORINE  1 drop Both Eyes BID  . doxycycline  100 mg Oral BID  . DULoxetine  60 mg Oral Daily  . enoxaparin (LOVENOX) injection  40 mg Subcutaneous Q24H  . feeding supplement (ENSURE ENLIVE)  237 mL Oral BID BM  . folic acid  2 mg Oral Daily  . furosemide  60 mg Intravenous Q12H  . gabapentin  600 mg Oral TID  . losartan  12.5 mg Oral Daily  . melatonin  6 mg Oral QHS  . metoprolol succinate  25 mg Oral BID WC  . multivitamin with minerals  1 tablet Oral Daily  . pantoprazole  40 mg Oral BID  . sodium chloride flush  3 mL Intravenous Q12H  . sodium chloride flush  3 mL Intravenous Q12H   Continuous Infusions: . sodium chloride    .  ceFAZolin (ANCEF) IV 2 g (06/25/19 0514)   PRN Meds: sodium chloride, acetaminophen **OR** acetaminophen, baclofen, lidocaine, polyethylene glycol, sodium chloride flush   Vital Signs    Vitals:   06/24/19 1806 06/24/19 2026 06/25/19 0312 06/25/19 0500  BP: (!) 112/55 (!) 112/56 (!) 105/51   Pulse: 91 86 80   Resp: 14 14 11    Temp: 98.2 F (36.8 C) 98.2 F (36.8 C) (!) 97.4 F (36.3 C)   TempSrc: Oral Oral Oral   SpO2: 100% 100% 100%   Weight:    62.2 kg  Height:        Intake/Output Summary (Last 24 hours) at 06/25/2019 0727 Last data filed at 06/25/2019 0316 Gross per 24 hour  Intake 450 ml  Output 2150 ml  Net -1700 ml   Net neg 12 L L   Last 3 Weights 06/25/2019 06/24/2019 06/23/2019  Weight (lbs) 137 lb 2 oz 137 lb 9.1 oz 148 lb 13 oz  Weight (kg) 62.2 kg 62.4 kg 67.5 kg      Telemetry     SR - Personally Reviewed  ECG    No EKG- Personally Reviewed  Physical Exam   GEN: Thin 74 yo in no acute distress.   Neck: JVP is not elevated    Cardiac:  RRR, no murmurs, rubs, or gallops.  Respiratory: Decreased BS at R base   GI: Soft, nontender, non-distended  MS:  Triv bilateral LE edema; No deformity. Neuro:  Nonfocal  Psych: Normal affect   Labs    High Sensitivity Troponin:   Recent Labs  Lab 06/19/19 1935  TROPONINIHS 12      Chemistry Recent Labs  Lab 06/23/19 0505 06/24/19 0548 06/25/19 0500  NA 138 134* 130*  K 3.7 3.4* 4.0  CL 94* 87* 87*  CO2 36* 38* 37*  GLUCOSE 98 98 95  BUN 15 13 13   CREATININE 0.84 0.61 0.85  CALCIUM 7.9* 8.0* 8.1*  PROT 5.6* 5.9* 5.7*  ALBUMIN 1.9* 2.0* 1.9*  AST 19 20 27   ALT <5 5 <5  ALKPHOS 132* 123 128*  BILITOT 0.8 0.8 0.8  GFRNONAA >60 >60 >60  GFRAA >60 >60 >60  ANIONGAP 8 9 6      Hematology Recent Labs  Lab 06/23/19 0505 06/24/19 0548 06/25/19 0500  WBC 8.4 9.8 8.8  RBC  2.80* 2.86* 2.76*  HGB 8.9* 9.3* 9.1*  HCT 28.9* 29.9* 28.7*  MCV 103.2* 104.5* 104.0*  MCH 31.8 32.5 33.0  MCHC 30.8 31.1 31.7  RDW 16.5* 16.5* 16.5*  PLT 278 331 325    BNP Recent Labs  Lab 06/19/19 1940  BNP 1,843.9*     DDimer No results for input(s): DDIMER in the last 168 hours.   Radiology    DG CHEST PORT 1 VIEW  Result Date: 06/24/2019 CLINICAL DATA:  Shortness of breath EXAM: PORTABLE CHEST 1 VIEW COMPARISON:  Yesterday FINDINGS: Normal heart size and mediastinal contours. Right PICC with tip at the upper right atrium. Bilateral pleural effusion with borderline improvement on the right. The right effusion appears moderate and the left effusion more small volume. Streaky density at the lung bases. No visible pneumothorax IMPRESSION: Bilateral pleural effusion that are stable to mildly decreased. No new abnormality. Electronically Signed   By: Monte Fantasia M.D.   On: 06/24/2019 08:33    Cardiac Studies     Patient Profile     Samantha Keller is a 74 y.o. female with a hx of rheumatoid arthritis, recent extended admission in [place] with MSSA bacteremia,  who is  being seen today for the evaluation of acute HF at the request of Dr . Alfredia Ferguson   Assessment & Plan    1. Acute systolic HF  LVEF 20 to 92%    This is new   Plan for medical Rx for now   No ischemic eval given 1. No CP and 2 other medical issues (GI bleed, recent infeciton, poor nutrition) Volume is improved    BP a little low   Hold am diuretics   CXR with improvement in effusion    BP limits Rx   On low dose toprol     Will see about getting ReDS  Will need f/u echo down the road    2. Hypokalemia/Hypomagnesemia    K is 4    3. High TSH/normal Free T4 - per primary team  Plan for f/u in near future   4  Anemia  Hgb 0.1  Macrocytic    5  Malnutrition   Alb 1.9        Nutrition seeing     For questions or updates, please contact [place] Please consult [email or web address] for contact info under        Signed, [name], MD  06/25/2019, 7:27 AM

## 2019-06-25 NOTE — Progress Notes (Signed)
[facility]  PROGRESS NOTE  [name] GMW:[id number] DOB: 22-Sep-1945 DOA: 06/19/2019 PCP: Patient, [name] Admit date - 06/19/2019   Admitting Physician Samantha Bulls, MD  Outpatient Primary MD for the patient is Patient, [name]  LOS - 5 Brief Narrative   Samantha Keller is a 74 y.o. year old female with medical history significant for recent hospitalization for MSSA pneumonia complicated by bacteremia, left thigh cellulitis, bilateral hip abscesses pneumonia (CT chest showed cavitation of right upper lobe infiltrate) (status post I&D) treated with IV cefazolin  who presented on 06/19/2019 due to abnormal CT chest obtain an ID clinic for follow-up which showed large bilateral pleural effusions and complete collapse of left lower lobe with spiculated nodule right upper lobe.    Hospital course: Patient underwent IR guided thoracentesis (650 cc clear yellow fluid on (3/26,).  Cardiology was consulted for new acute systolic CHF.  Patient has been diuresing with IV Lasix    Subjective  Today reports no chest pain, improvement in breathing, no cough.  Denies fevers or chills.  A & P    Acute systolic CHF, stable -13 L during hospital stay. -Currently holding IV Lasix given soft BPs -Appreciate cardiology recommendations -Continue medical management with Lopressor, closely monitor -Will likely proceed with ischemic evaluation with left heart cath as outpatient  Bilateral transudative/culture-negative pleural effusions with chronic hypoxic respiratory failure, multifactorial etiology (malnutrition/anasarca/CHF/recent MSSA pneumonia) related to above Baseline O2 requirements of 3 L prior to admission, currently on 2 with notable improvement in respiratory status/lung exam -Wean O2 as able -Diurese as able mentioned above azotemia -Replete potassium to maintain goal of 4  Hypertension, currently having hypotensive episodes.  SBP less than 100 this a.m. -Likely exacerbated in  setting of IV diuresis, will hold off on further -Closely monitor BP -Parameters in place for Lopressor 25 mg twice daily, continue losartan  MSSA bacteremia, pneumonia, soft tissue abscess/right hip infection, present on admission.  Has completed 6-week course of cefazolin (end date 3/31) -Appreciate ID recommendations -Patient can be removed prior to discharge -Outpatient follow up with Dr. Megan Salon on 07/21/2019 --?doxycycline  Severe/moderate malnutrition in setting of chronic illness -Appreciate nutrition recommendations, continue Ensure  Elevated TSH, normal free T4 -Euthyroid in setting of chronic illness -We will need repeat TSH as outpatient.  Hyponatremia, in the setting of hypervolemia.  Sodium 130 (previously 134).-Closely monitor BMP, currently holding off on diuresis  Macrocytic anemia hemoglobin stable at baseline.  Has history of recent UGI bleed, no bleeding episodes here -Daily CBC -continue home folic acid -PPI  GERD, stable -Continue PPI  Peripheral neuropathy, stable-continue gabapentin  RA previously on Humira -Continue Cymbalta, gabapentin, as needed baclofen     Family Communication  : Sister updated at bedside on 3/31  Code Status : Full code  Disposition Plan  :  Patient is from rehab facility. Anticipated d/c date: 2 to 3 days. Barriers to d/c or necessity for inpatient status:  Close monitoring of blood pressure, need to evaluate for volume status and need for any further IV diuresis Consults  : Radiology, infectious disease  Procedures  : IR guided thoracentesis, TTE  DVT Prophylaxis  :  Lovenox   Lab Results  Component Value Date   PLT 325 06/25/2019    Diet :  Diet Order            Diet Heart Room service appropriate? Yes with Assist; Fluid consistency: Thin; Fluid restriction: 1500 mL Fluid  Diet effective  now               Inpatient Medications Scheduled Meds: . Chlorhexidine Gluconate Cloth  6 each Topical Daily  .  cycloSPORINE  1 drop Both Eyes BID  . doxycycline  100 mg Oral BID  . DULoxetine  60 mg Oral Daily  . enoxaparin (LOVENOX) injection  40 mg Subcutaneous Q24H  . feeding supplement (ENSURE ENLIVE)  237 mL Oral BID BM  . folic acid  2 mg Oral Daily  . gabapentin  600 mg Oral TID  . losartan  12.5 mg Oral Daily  . melatonin  6 mg Oral QHS  . metoprolol succinate  25 mg Oral BID WC  . multivitamin with minerals  1 tablet Oral Daily  . pantoprazole  40 mg Oral BID  . sodium chloride flush  3 mL Intravenous Q12H  . sodium chloride flush  3 mL Intravenous Q12H   Continuous Infusions: . sodium chloride    .  ceFAZolin (ANCEF) IV 2 g (06/25/19 1530)   PRN Meds:.sodium chloride, acetaminophen **OR** acetaminophen, baclofen, lidocaine, polyethylene glycol, sodium chloride flush  Antibiotics  :   Anti-infectives (From admission, onward)   Start     Dose/Rate Route Frequency Ordered Stop   06/19/19 2315  doxycycline (VIBRA-TABS) tablet 100 mg     100 mg Oral 2 times daily 06/19/19 2304     06/19/19 2315  ceFAZolin (ANCEF) IVPB 2g/100 mL premix     2 g 200 mL/hr over 30 Minutes Intravenous Every 8 hours 06/19/19 2306 06/25/19 2359       Objective   Vitals:   06/25/19 0312 06/25/19 0500 06/25/19 0854 06/25/19 1147  BP: (!) 105/51  (!) 98/53 (!) 117/55  Pulse: 80  86 87  Resp: 11  12 12   Temp: (!) 97.4 F (36.3 C)  98.2 F (36.8 C)   TempSrc: Oral  Oral   SpO2: 100%  100% 100%  Weight:  62.2 kg    Height:        SpO2: 100 % O2 Flow Rate (L/min): 1 L/min  Wt Readings from Last 3 Encounters:  06/25/19 62.2 kg  06/11/19 74.9 kg     Intake/Output Summary (Last 24 hours) at 06/25/2019 1709 Last data filed at 06/25/2019 0900 Gross per 24 hour  Intake 10 ml  Output 2150 ml  Net -2140 ml    Physical Exam:     Awake Alert, Oriented X 3, elderly female in no distress No new F.N deficits,  [place].AT, Normal respiratory effort on 2 L nasal cannula, no crackles, breath sounds  heard throughout RRR,No Gallops,Rubs or new Murmurs,  +ve B.Sounds, Abd Soft, No tenderness, No rebound, guarding or rigidity. 1+ pitting edema bilateral lower extremities to caves   I have personally reviewed the following:   Data Reviewed:  CBC Recent Labs  Lab 06/21/19 0422 06/22/19 0449 06/23/19 0505 06/24/19 0548 06/25/19 0500  WBC 8.0 8.3 8.4 9.8 8.8  HGB 8.3* 8.8* 8.9* 9.3* 9.1*  HCT 27.2* 28.4* 28.9* 29.9* 28.7*  PLT 311 316 278 331 325  MCV 105.4* 103.3* 103.2* 104.5* 104.0*  MCH 32.2 32.0 31.8 32.5 33.0  MCHC 30.5 31.0 30.8 31.1 31.7  RDW 17.2* 16.7* 16.5* 16.5* 16.5*  LYMPHSABS 3.2 3.0 3.2 3.6 3.4  MONOABS 0.6 0.8 0.7 0.8 0.8  EOSABS 0.3 0.4 0.4 0.4 0.4  BASOSABS 0.0 0.1 0.1 0.1 0.0    Chemistries  Recent Labs  Lab 06/21/19 0422 06/22/19 0449 06/23/19 0505 06/24/19  0388 06/25/19 0500  NA 136 136 138 134* 130*  K 3.4* 3.0* 3.7 3.4* 4.0  CL 99 94* 94* 87* 87*  CO2 29 33* 36* 38* 37*  GLUCOSE 95 98 98 98 95  BUN 13 16 15 13 13   CREATININE 0.73 0.70 0.84 0.61 0.85  CALCIUM 8.1* 8.0* 7.9* 8.0* 8.1*  MG 1.6* 1.6* 1.7 1.7 1.8  AST 39 22 19 20 27   ALT <5 <5 <5 5 <5  ALKPHOS 154* 144* 132* 123 128*  BILITOT 0.9 0.7 0.8 0.8 0.8   ------------------------------------------------------------------------------------------------------------------ No results for input(s): CHOL, HDL, LDLCALC, TRIG, CHOLHDL, LDLDIRECT in the last 72 hours.  No results found for: HGBA1C ------------------------------------------------------------------------------------------------------------------ No results for input(s): TSH, T4TOTAL, T3FREE, THYROIDAB in the last 72 hours.  Invalid input(s): FREET3 ------------------------------------------------------------------------------------------------------------------ No results for input(s): VITAMINB12, FOLATE, FERRITIN, TIBC, IRON, RETICCTPCT in the last 72 hours.  Coagulation profile Recent Labs  Lab 06/19/19 1935  INR  1.1    No results for input(s): DDIMER in the last 72 hours.  Cardiac Enzymes No results for input(s): CKMB, TROPONINI, MYOGLOBIN in the last 168 hours.  Invalid input(s): CK ------------------------------------------------------------------------------------------------------------------    Component Value Date/Time   BNP 1,843.9 (H) 06/19/2019 1940    Micro Results Recent Results (from the past 240 hour(s))  Respiratory Panel by RT PCR (Flu A&B, Covid) - Nasopharyngeal Swab     Status: None   Collection Time: 06/19/19  8:24 PM   Specimen: Nasopharyngeal Swab  Result Value Ref Range Status   SARS Coronavirus 2 by RT PCR NEGATIVE NEGATIVE Final    Comment: (NOTE) SARS-CoV-2 target nucleic acids are NOT DETECTED. The SARS-CoV-2 RNA is generally detectable in upper respiratoy specimens during the acute phase of infection. The lowest concentration of SARS-CoV-2 viral copies this assay can detect is 131 copies/mL. A negative result does not preclude SARS-Cov-2 infection and should not be used as the sole basis for treatment or other patient management decisions. A negative result may occur with  improper specimen collection/handling, submission of specimen other than nasopharyngeal swab, presence of viral mutation(s) within the areas targeted by this assay, and inadequate number of viral copies (<131 copies/mL). A negative result must be combined with clinical observations, patient history, and epidemiological information. The expected result is Negative. Fact Sheet for Patients:  PinkCheek.be Fact Sheet for Healthcare Providers:  GravelBags.it This test is not yet ap proved or cleared by the [place] FDA and  has been authorized for detection and/or diagnosis of SARS-CoV-2 by FDA under an Emergency Use Authorization (EUA). This EUA will remain  in effect (meaning this test can be used) for the duration of  the COVID-19 declaration under Section 564(b)(1) of the Act, 21 U.S.C. section 360bbb-3(b)(1), unless the authorization is terminated or revoked sooner.    Influenza A by PCR NEGATIVE NEGATIVE Final   Influenza B by PCR NEGATIVE NEGATIVE Final    Comment: (NOTE) The Xpert Xpress SARS-CoV-2/FLU/RSV assay is intended as an aid in  the diagnosis of influenza from Nasopharyngeal swab specimens and  should not be used as a sole basis for treatment. Nasal washings and  aspirates are unacceptable for Xpert Xpress SARS-CoV-2/FLU/RSV  testing. Fact Sheet for Patients: PinkCheek.be Fact Sheet for Healthcare Providers: GravelBags.it This test is not yet approved or cleared by the [place] FDA and  has been authorized for detection and/or diagnosis of SARS-CoV-2 by  FDA under an Emergency Use Authorization (EUA). This EUA will remain  in effect (meaning this test  can be used) for the duration of the  Covid-19 declaration under Section 564(b)(1) of the Act, 21  U.S.C. section 360bbb-3(b)(1), unless the authorization is  terminated or revoked. Performed at [facility], [place] [address], [place], [place] [address]   Culture, blood (routine x 2)     Status: None   Collection Time: 06/19/19 11:25 PM   Specimen: BLOOD  Result Value Ref Range Status   Specimen Description BLOOD RIGHT ARM  Final   Special Requests   Final    BOTTLES DRAWN AEROBIC AND ANAEROBIC Blood Culture adequate volume   Culture   Final    NO GROWTH 5 DAYS Performed at [facility], [address], [place], [place] [address]    Report Status 06/24/2019 FINAL  Final  Culture, blood (routine x 2)     Status: None   Collection Time: 06/19/19 11:30 PM   Specimen: BLOOD  Result Value Ref Range Status   Specimen Description BLOOD LEFT HAND  Final   Special Requests   Final    BOTTLES DRAWN AEROBIC AND ANAEROBIC Blood Culture adequate volume   Culture    Final    NO GROWTH 5 DAYS Performed at [facility], [place] [address], [place], [place] [address]    Report Status 06/24/2019 FINAL  Final  MRSA PCR Screening     Status: None   Collection Time: 06/20/19  8:59 AM   Specimen: Nasal Mucosa; Nasopharyngeal  Result Value Ref Range Status   MRSA by PCR NEGATIVE NEGATIVE Final    Comment:        The GeneXpert MRSA Assay (FDA approved for NASAL specimens only), is one component of a comprehensive MRSA colonization surveillance program. It is not intended to diagnose MRSA infection nor to guide or monitor treatment for MRSA infections. Performed at [facility], [place] [address] [place], [place] [address]   Gram stain     Status: None   Collection Time: 06/20/19 10:56 AM   Specimen: PATH Cytology Pleural fluid  Result Value Ref Range Status   Specimen Description FLUID  Final   Special Requests NONE  Final   Gram Stain   Final    CYTOSPIN SMEAR WBC PRESENT, PREDOMINANTLY MONONUCLEAR NO ORGANISMS SEEN Performed at [facility], [place] [address], [place], [place] [address]    Report Status 06/20/2019 FINAL  Final  Culture, body fluid-bottle     Status: None   Collection Time: 06/20/19 10:58 AM   Specimen: Fluid  Result Value Ref Range Status   Specimen Description FLUID  Final   Special Requests NONE  Final   Culture   Final    NO GROWTH 5 DAYS Performed at [facility] [address], [place], [place] [address]    Report Status 06/25/2019 FINAL  Final    Radiology Reports DG Chest 1 View  Result Date: 06/20/2019 CLINICAL DATA:  Prior thoracentesis. EXAM: CHEST  1 VIEW COMPARISON:  CT 02/19/2020. FINDINGS: PICC line noted with tip over cavoatrial junction. Heart size normal. Interim decrease in left-sided pleural effusion following thoracentesis. No pneumothorax. Persistent right pleural effusion. Persistent bibasilar atelectasis. Prior cervical spine fusion. IMPRESSION: 1.  Right PICC line noted with tip  over cavoatrial junction. 2. Near complete resolution of left-sided pleural effusion following thoracentesis. No evidence of pneumothorax. 3. Persistent right pleural effusion. Persistent bibasilar atelectasis. Electronically Signed   By: Marcello Moores  Register   On: 06/20/2019 09:59   CT  CHEST W CONTRAST  Addendum Date: 06/25/2019   ADDENDUM REPORT: 06/25/2019 08:14 ADDENDUM: Prior images which include only plain film evaluations of the chest show evidence of effusions and basilar airspace disease with similar appearance. The CT comparison for the pulmonary nodule in the right upper lobe is not available to determine whether this is diminishing in size. Would suggest retrieval of prior images and short interval follow-up in 6-8 weeks from today's study which would be approximately 3 months from the initial exam. Comparison could be made to the more remote exams at that time. Electronically Signed   By: Zetta Bills M.D.   On: 06/25/2019 08:14   Result Date: 06/25/2019 CLINICAL DATA:  Abnormal x-ray. EXAM: CT CHEST WITH CONTRAST TECHNIQUE: Multidetector CT imaging of the chest was performed during intravenous contrast administration. CONTRAST:  43mL OMNIPAQUE IOHEXOL 300 MG/ML  SOLN COMPARISON:  None. FINDINGS: Cardiovascular: Heart size is enlarged without pericardial effusion. Aortic caliber is normal. Left vertebral arising directly from the aortic arch. Central pulmonary arteries are engorged. Main pulmonary artery measuring approximately 2.9 cm. Not well evaluated given bolus timing. Mediastinum/Nodes: No adenopathy in the mediastinum or hilar areas with no axillary lymphadenopathy. Lungs/Pleura: Large bilateral pleural effusions. Spiculated nodule in the right upper lobe measures 12 by 11 mm (image 30, series 7) there is complete collapse of the left lower lobe and partial collapse of the lingula in the left chest. Partial collapse of the right lower lobe due to bilateral pleural effusions. Variable  enhancement of collapsed lung at the lung bases also raising the question pneumonia with fluid in distal bronchi. Large airways are patent. Upper Abdomen: Incidental imaging of upper abdominal contents is unremarkable. Musculoskeletal: Diffuse body wall edema. Ruptured left breast implant with peripheral calcification. No chest wall mass. Signs of cervical spinal fusion with anterolisthesis of C7 on T1 and extensive degenerative changes at the cervicothoracic junction. No destructive bone finding. IMPRESSION: 1. Large bilateral pleural effusions with complete collapse of the left lower lobe and partial collapse of the lingula in the left chest. No current access to prior imaging which would allow for comparison. Currently in the process of attempting to retrieve prior imaging studies. 2. Variable enhancement of collapsed lung at the lung bases also raising the question of pneumonia with fluid in distal bronchi. This raises the question of current or prior pneumonia. 3. Spiculated nodule in the right upper lobe measures 12 x 11 mm. This is suspicious for malignancy. Though by report the patient has a history of a nodule which may have been improving. 4. Anasarca with body wall edema in addition to effusions. These results will be called to the ordering clinician or representative by the Radiologist Assistant, and communication documented in the PACS or [employer]. Electronically Signed: By: Zetta Bills M.D. On: 06/19/2019 17:12   DG CHEST PORT 1 VIEW  Result Date: 06/25/2019 CLINICAL DATA:  Bilateral pleural effusions with continued shortness of breath. EXAM: PORTABLE CHEST 1 VIEW COMPARISON:  06/24/2019 FINDINGS: There is a right arm PICC line with tip at the cavoatrial junction. Or normal heart size. Decrease in left pleural effusion. Unchanged small to moderate right effusion. Unchanged appearance left lower lobe atelectasis. IMPRESSION: 1. Left lower lobe atelectasis. 2. Persistent right pleural  effusion with decrease in left effusion. Electronically Signed   By: Kerby Moors M.D.   On: 06/25/2019 08:47   DG CHEST PORT 1 VIEW  Result Date: 06/24/2019 CLINICAL DATA:  Shortness of breath EXAM: PORTABLE CHEST 1 VIEW  COMPARISON:  Yesterday FINDINGS: Normal heart size and mediastinal contours. Right PICC with tip at the upper right atrium. Bilateral pleural effusion with borderline improvement on the right. The right effusion appears moderate and the left effusion more small volume. Streaky density at the lung bases. No visible pneumothorax IMPRESSION: Bilateral pleural effusion that are stable to mildly decreased. No new abnormality. Electronically Signed   By: Monte Fantasia M.D.   On: 06/24/2019 08:33   DG CHEST PORT 1 VIEW  Result Date: 06/23/2019 CLINICAL DATA:  Shortness of breath, large pleural effusion. EXAM: PORTABLE CHEST 1 VIEW COMPARISON:  06/21/2019 and CT chest 06/19/2019. FINDINGS: Right PICC terminates in the low SVC or at the SVC RA junction. Trachea is midline. Heart size stable. There are bilateral pleural effusions, large on the right and moderate on the left, similar to 06/21/2019. There may be mild mid and lower lung zone interstitial prominence and indistinctness. Right humeral head is high riding and there are degenerative changes in the right shoulder. IMPRESSION: 1. Bilateral pleural effusions, moderate on the right and small on the left, similar. 2. Suspect mild pulmonary edema with bibasilar atelectasis. Electronically Signed   By: Lorin Picket M.D.   On: 06/23/2019 07:56   DG CHEST PORT 1 VIEW  Result Date: 06/21/2019 CLINICAL DATA:  Shortness of breath and pleural effusion EXAM: PORTABLE CHEST 1 VIEW COMPARISON:  June 20, 2019 FINDINGS: Right PICC line is identified unchanged. The mediastinal contour and cardiac silhouette are stable. Small to moderate left pleural effusion is increased compared prior exam. Moderate right pleural effusion is unchanged. Scoliosis  with prior postsurgical change of cervical spine are noted unchanged. IMPRESSION: Small to moderate left pleural effusion increased compared prior exam. Moderate right pleural effusion is unchanged. Electronically Signed   By: Abelardo Diesel M.D.   On: 06/21/2019 11:19   ECHOCARDIOGRAM COMPLETE  Result Date: 06/20/2019    ECHOCARDIOGRAM REPORT   Patient Name:   Samantha Keller Forrester Date of Exam: 06/20/2019 Medical Rec #:  [id number]      Height:       62.0 in Accession #:    [id number]     Weight:       160.5 lb Date of Birth:  Jun 02, 1945       BSA:          1.741 m Patient Age:    29 years       BP:           112/63 mmHg Patient Gender: F              HR:           90 bpm. Exam Location:  Inpatient Procedure: 2D Echo Indications:    Dyspnea R06.00  History:        Patient has no prior history of Echocardiogram examinations.  Sonographer:    Mikki Santee RDCS (AE) Referring Phys: [id number] Georgina Quint LATIF [place]  1. Left ventricular ejection fraction, by estimation, is 20 to 25%. The left ventricle has severely decreased function. The left ventricle demonstrates global hypokinesis. Indeterminate diastolic filling due to E-A fusion.  2. Right ventricular systolic function is mildly reduced. The right ventricular size is moderately enlarged. There is moderately elevated pulmonary artery systolic pressure. The estimated right ventricular systolic pressure is 10.9 mmHg.  3. Right atrial size was mild to moderately dilated.  4. The mitral valve is grossly normal. Mild to moderate mitral valve regurgitation.  5. Tricuspid valve regurgitation is moderate.  6. The aortic valve is tricuspid. Aortic valve regurgitation is not visualized. No aortic stenosis is present.  7. The inferior vena cava is dilated in size with <50% respiratory variability, suggesting right atrial pressure of 15 mmHg. FINDINGS  Left Ventricle: Left ventricular ejection fraction, by estimation, is 20 to 25%. The left ventricle has severely  decreased function. The left ventricle demonstrates global hypokinesis. The left ventricular internal cavity size was normal in size. There is no left ventricular hypertrophy. Abnormal (paradoxical) septal motion, consistent with left bundle branch block. Indeterminate diastolic filling due to E-A fusion. Right Ventricle: The right ventricular size is moderately enlarged. No increase in right ventricular wall thickness. Right ventricular systolic function is mildly reduced. There is moderately elevated pulmonary artery systolic pressure. The tricuspid regurgitant velocity is 3.08 m/s, and with an assumed right atrial pressure of 15 mmHg, the estimated right ventricular systolic pressure is 78.4 mmHg. Left Atrium: Left atrial size was normal in size. Right Atrium: Right atrial size was mild to moderately dilated. Pericardium: There is no evidence of pericardial effusion. Presence of pericardial fat pad. Mitral Valve: The mitral valve is grossly normal. Mild to moderate mitral valve regurgitation. Tricuspid Valve: The tricuspid valve is grossly normal. Tricuspid valve regurgitation is moderate. Aortic Valve: The aortic valve is tricuspid. Aortic valve regurgitation is not visualized. No aortic stenosis is present. Pulmonic Valve: The pulmonic valve was grossly normal. Pulmonic valve regurgitation is not visualized. No evidence of pulmonic stenosis. Aorta: The aortic root and ascending aorta are structurally normal, with no evidence of dilitation. Venous: The inferior vena cava is dilated in size with less than 50% respiratory variability, suggesting right atrial pressure of 15 mmHg. IAS/Shunts: The atrial septum is grossly normal. Additional Comments: A venous catheter is visualized in the right atrium. There is a small pleural effusion in the left lateral region.  LEFT VENTRICLE PLAX 2D LVIDd:         4.50 cm     Diastology LVIDs:         3.60 cm     LV e' lateral:   6.30 cm/s LV PW:         1.00 cm     LV E/e'  lateral: 12.9 LV IVS:        0.80 cm     LV e' medial:    4.06 cm/s LVOT diam:     1.90 cm     LV E/e' medial:  20.0 LV SV:         31 LV SV Index:   18 LVOT Area:     2.84 cm  LV Volumes (MOD) LV vol d, MOD A2C: 79.9 ml LV vol d, MOD A4C: 72.4 ml LV vol s, MOD A2C: 51.7 ml LV vol s, MOD A4C: 45.7 ml LV SV MOD A2C:     28.2 ml LV SV MOD A4C:     72.4 ml LV SV MOD BP:      29.5 ml RIGHT VENTRICLE RV S prime:     8.70 cm/s TAPSE (M-mode): 1.2 cm LEFT ATRIUM             Index       RIGHT ATRIUM           Index LA diam:        3.50 cm 2.01 cm/m  RA Area:     16.90 cm LA Vol (A2C):   36.2 ml 20.79 ml/m RA Volume:   50.10 ml  28.78 ml/m LA  Vol (A4C):   41.3 ml 23.72 ml/m LA Biplane Vol: 38.8 ml 22.29 ml/m  AORTIC VALVE LVOT Vmax:   70.00 cm/s LVOT Vmean:  44.400 cm/s LVOT VTI:    0.108 m  AORTA Ao Root diam: 2.30 cm Ao Asc diam:  3.00 cm MITRAL VALVE               TRICUSPID VALVE MV Area (PHT): 3.99 cm    TR Peak grad:   37.9 mmHg MV Decel Time: 190 msec    TR Vmax:        308.00 cm/s MR Peak grad: 33.6 mmHg MR Vmax:      290.00 cm/s  SHUNTS MV E velocity: 81.00 cm/s  Systemic VTI:  0.11 m MV A velocity: 72.20 cm/s  Systemic Diam: 1.90 cm MV E/A ratio:  1.12 Eleonore Chiquito MD Electronically signed by Eleonore Chiquito MD Signature Date/Time: 06/20/2019/4:09:00 PM    Final    VAS [place] LOWER EXTREMITY VENOUS (DVT)  Result Date: 06/22/2019  Lower Venous DVTStudy Indications: Edema.  Comparison Study: no prior study Performing Technologist: Sharion Dove RVS  Examination Guidelines: A complete evaluation includes B-mode imaging, spectral Doppler, color Doppler, and power Doppler as needed of all accessible portions of each vessel. Bilateral testing is considered an integral part of a complete examination. Limited examinations for reoccurring indications may be performed as noted. The reflux portion of the exam is performed with the patient in reverse Trendelenburg.   +---------+---------------+---------+-----------+----------+--------------+ RIGHT    CompressibilityPhasicitySpontaneityPropertiesThrombus Aging +---------+---------------+---------+-----------+----------+--------------+ CFV      Full                                         pulsatile      +---------+---------------+---------+-----------+----------+--------------+ SFJ      Full                                                        +---------+---------------+---------+-----------+----------+--------------+ FV Prox  Full                                                        +---------+---------------+---------+-----------+----------+--------------+ FV Mid   Full                                                        +---------+---------------+---------+-----------+----------+--------------+ FV DistalFull                                                        +---------+---------------+---------+-----------+----------+--------------+ PFV      Full                                                        +---------+---------------+---------+-----------+----------+--------------+  POP      Full                                         pulsatile      +---------+---------------+---------+-----------+----------+--------------+ PTV      Full                                                        +---------+---------------+---------+-----------+----------+--------------+ PERO     Full                                                        +---------+---------------+---------+-----------+----------+--------------+   +---------+---------------+---------+-----------+----------+--------------+ LEFT     CompressibilityPhasicitySpontaneityPropertiesThrombus Aging +---------+---------------+---------+-----------+----------+--------------+ CFV      Full                                         pulsatile       +---------+---------------+---------+-----------+----------+--------------+ SFJ      Full                                                        +---------+---------------+---------+-----------+----------+--------------+ FV Prox  Full                                                        +---------+---------------+---------+-----------+----------+--------------+ FV Mid   Full                                                        +---------+---------------+---------+-----------+----------+--------------+ FV DistalFull                                                        +---------+---------------+---------+-----------+----------+--------------+ PFV      Full                                                        +---------+---------------+---------+-----------+----------+--------------+ POP      Full                                         pulsatile      +---------+---------------+---------+-----------+----------+--------------+  PTV      Full                                                        +---------+---------------+---------+-----------+----------+--------------+ PERO     Full                                                        +---------+---------------+---------+-----------+----------+--------------+     Summary: BILATERAL: - No evidence of deep vein thrombosis seen in the lower extremities, bilaterally.   *See table(s) above for measurements and observations. Electronically signed by Deitra Mayo MD on 06/22/2019 at 6:39:29 PM.    Final    IR THORACENTESIS ASP PLEURAL SPACE W/IMG GUIDE  Result Date: 06/20/2019 INDICATION: Large bilateral pleural effusions with complete left lower lobe collapse, partial collapse of lingula, and spiculated right upper lobe nodule. Request for diagnostic and therapeutic thoracentesis. EXAM: ULTRASOUND GUIDED LEFT THORACENTESIS MEDICATIONS: 1% lidocaine 10 mL COMPLICATIONS: None immediate. PROCEDURE: An  ultrasound guided thoracentesis was thoroughly discussed with the patient and questions answered. The benefits, risks, alternatives and complications were also discussed. The patient understands and wishes to proceed with the procedure. Written consent was obtained. Ultrasound was performed to localize and mark an adequate pocket of fluid in the left chest. The area was then prepped and draped in the normal sterile fashion. 1% Lidocaine was used for local anesthesia. Under ultrasound guidance a 6 Fr Safe-T-Centesis catheter was introduced. Thoracentesis was performed. The catheter was removed and a dressing applied. FINDINGS: A total of approximately 650 mL of clear yellow fluid was removed. Samples were sent to the laboratory as requested by the clinical team. IMPRESSION: Successful ultrasound guided left thoracentesis yielding 650 mL of pleural fluid. No pneumothorax on post-procedure chest x-ray. Read by: Gareth Eagle, PA-C Electronically Signed   By: Lucrezia Europe M.D.   On: 06/20/2019 10:11     Time Spent in minutes  30     Desiree Hane M.D on 06/25/2019 at 5:09 PM  To page go to [email or web address] - password [facility]

## 2019-06-25 NOTE — Progress Notes (Signed)
REDS Clip  READING= 17%  CHEST RULER = A Clip Station = [place], PharmD, BCCCP Clinical Pharmacist  Phone: [phone]  Please check AMION for all [place] phone numbers After 10:00 PM, call [place] [phone]

## 2019-06-25 NOTE — Progress Notes (Signed)
Patient ID: Samantha Keller, female   DOB: [birth year], 74 y.o.   MRN: [id number]         [facility] for Infectious Disease  Date of Admission:  06/19/2019   Total days of antibiotics 64         ASSESSMENT: She completes therapy for her MSSA bacteremia, pneumonia, soft tissue abscesses and right hip infection today.  I will stop cefazolin.  Her PICC should be removed before discharge.  PLAN: 1. Discontinue cefazolin after today's doses 2. She has a follow-up visit scheduled with me on 07/21/2019 3. I will sign off now  Principal Problem:   Large pleural effusion Active Problems:   Bacteremia due to methicillin susceptible Staphylococcus aureus (MSSA)   History of GI bleed   Rheumatoid arthritis (HCC)   Anemia   Nodule of right lung   Anasarca   Protein-calorie malnutrition, mild (HCC)   Pleural effusion   Acute systolic CHF (congestive heart failure) (HCC)   Metabolic acidosis   Scheduled Meds: . Chlorhexidine Gluconate Cloth  6 each Topical Daily  . cycloSPORINE  1 drop Both Eyes BID  . doxycycline  100 mg Oral BID  . DULoxetine  60 mg Oral Daily  . enoxaparin (LOVENOX) injection  40 mg Subcutaneous Q24H  . feeding supplement (ENSURE ENLIVE)  237 mL Oral BID BM  . folic acid  2 mg Oral Daily  . gabapentin  600 mg Oral TID  . losartan  12.5 mg Oral Daily  . melatonin  6 mg Oral QHS  . metoprolol succinate  25 mg Oral BID WC  . multivitamin with minerals  1 tablet Oral Daily  . pantoprazole  40 mg Oral BID  . sodium chloride flush  3 mL Intravenous Q12H  . sodium chloride flush  3 mL Intravenous Q12H   Continuous Infusions: . sodium chloride    .  ceFAZolin (ANCEF) IV 2 g (06/25/19 0514)   PRN Meds:.sodium chloride, acetaminophen **OR** acetaminophen, baclofen, lidocaine, polyethylene glycol, sodium chloride flush   SUBJECTIVE: She is feeling better.  Her weight is down 23 pounds since admission with diuresis.  Review of Systems: Review of Systems    Constitutional: Positive for weight loss. Negative for fever.  Respiratory: Negative for cough, sputum production and shortness of breath.   Cardiovascular: Negative for chest pain.  Gastrointestinal: Negative for abdominal pain, diarrhea, nausea and vomiting.    Allergies  Allergen Reactions  . Iodinated Diagnostic Agents Anaphylaxis  . Shellfish Allergy Anaphylaxis  . Shellfish-Derived Products Anaphylaxis    OBJECTIVE: Vitals:   06/25/19 0312 06/25/19 0500 06/25/19 0854 06/25/19 1147  BP: (!) 105/51  (!) 98/53 (!) 117/55  Pulse: 80  86 87  Resp: 11  12 12   Temp: (!) 97.4 F (36.3 C)  98.2 F (36.8 C)   TempSrc: Oral  Oral   SpO2: 100%  100% 100%  Weight:  62.2 kg    Height:       Body mass index is 25.08 kg/m.  Physical Exam Constitutional:      Comments: She is in good spirits.  She is playing games on her computer.  Cardiovascular:     Rate and Rhythm: Normal rate and regular rhythm.     Heart sounds: No murmur.  Pulmonary:     Effort: Pulmonary effort is normal.  Abdominal:     Palpations: Abdomen is soft.     Tenderness: There is no abdominal tenderness.  Musculoskeletal:     Right lower leg:  Edema present.     Left lower leg: Edema present.  Skin:    Findings: No rash.  Neurological:     General: No focal deficit present.  Psychiatric:        Mood and Affect: Mood normal.     Lab Results Lab Results  Component Value Date   WBC 8.8 06/25/2019   HGB 9.1 (L) 06/25/2019   HCT 28.7 (L) 06/25/2019   MCV 104.0 (H) 06/25/2019   PLT 325 06/25/2019    Lab Results  Component Value Date   CREATININE 0.85 06/25/2019   BUN 13 06/25/2019   NA 130 (L) 06/25/2019   K 4.0 06/25/2019   CL 87 (L) 06/25/2019   CO2 37 (H) 06/25/2019    Lab Results  Component Value Date   ALT <5 06/25/2019   AST 27 06/25/2019   ALKPHOS 128 (H) 06/25/2019   BILITOT 0.8 06/25/2019     Microbiology: Recent Results (from the past 240 hour(s))  Respiratory Panel by RT  PCR (Flu A&B, Covid) - Nasopharyngeal Swab     Status: None   Collection Time: 06/19/19  8:24 PM   Specimen: Nasopharyngeal Swab  Result Value Ref Range Status   SARS Coronavirus 2 by RT PCR NEGATIVE NEGATIVE Final    Comment: (NOTE) SARS-CoV-2 target nucleic acids are NOT DETECTED. The SARS-CoV-2 RNA is generally detectable in upper respiratoy specimens during the acute phase of infection. The lowest concentration of SARS-CoV-2 viral copies this assay can detect is 131 copies/mL. A negative result does not preclude SARS-Cov-2 infection and should not be used as the sole basis for treatment or other patient management decisions. A negative result may occur with  improper specimen collection/handling, submission of specimen other than nasopharyngeal swab, presence of viral mutation(s) within the areas targeted by this assay, and inadequate number of viral copies (<131 copies/mL). A negative result must be combined with clinical observations, patient history, and epidemiological information. The expected result is Negative. Fact Sheet for Patients:  PinkCheek.be Fact Sheet for Healthcare Providers:  GravelBags.it This test is not yet ap proved or cleared by the [place] FDA and  has been authorized for detection and/or diagnosis of SARS-CoV-2 by FDA under an Emergency Use Authorization (EUA). This EUA will remain  in effect (meaning this test can be used) for the duration of the COVID-19 declaration under Section 564(b)(1) of the Act, 21 U.S.C. section 360bbb-3(b)(1), unless the authorization is terminated or revoked sooner.    Influenza A by PCR NEGATIVE NEGATIVE Final   Influenza B by PCR NEGATIVE NEGATIVE Final    Comment: (NOTE) The Xpert Xpress SARS-CoV-2/FLU/RSV assay is intended as an aid in  the diagnosis of influenza from Nasopharyngeal swab specimens and  should not be used as a sole basis for treatment. Nasal  washings and  aspirates are unacceptable for Xpert Xpress SARS-CoV-2/FLU/RSV  testing. Fact Sheet for Patients: PinkCheek.be Fact Sheet for Healthcare Providers: GravelBags.it This test is not yet approved or cleared by the [place] FDA and  has been authorized for detection and/or diagnosis of SARS-CoV-2 by  FDA under an Emergency Use Authorization (EUA). This EUA will remain  in effect (meaning this test can be used) for the duration of the  Covid-19 declaration under Section 564(b)(1) of the Act, 21  U.S.C. section 360bbb-3(b)(1), unless the authorization is  terminated or revoked. Performed at [facility], [place] [address] [place], [place] [address]   Culture, blood (routine x 2)  Status: None   Collection Time: 06/19/19 11:25 PM   Specimen: BLOOD  Result Value Ref Range Status   Specimen Description BLOOD RIGHT ARM  Final   Special Requests   Final    BOTTLES DRAWN AEROBIC AND ANAEROBIC Blood Culture adequate volume   Culture   Final    NO GROWTH 5 DAYS Performed at [facility], [address] [place], [place] [address]    Report Status 06/24/2019 FINAL  Final  Culture, blood (routine x 2)     Status: None   Collection Time: 06/19/19 11:30 PM   Specimen: BLOOD  Result Value Ref Range Status   Specimen Description BLOOD LEFT HAND  Final   Special Requests   Final    BOTTLES DRAWN AEROBIC AND ANAEROBIC Blood Culture adequate volume   Culture   Final    NO GROWTH 5 DAYS Performed at [facility], [place] [address] [place], [place] [address]    Report Status 06/24/2019 FINAL  Final  MRSA PCR Screening     Status: None   Collection Time: 06/20/19  8:59 AM   Specimen: Nasal Mucosa; Nasopharyngeal  Result Value Ref Range Status   MRSA by PCR NEGATIVE NEGATIVE Final    Comment:        The GeneXpert MRSA Assay (FDA approved for NASAL specimens only), is one component of a comprehensive  MRSA colonization surveillance program. It is not intended to diagnose MRSA infection nor to guide or monitor treatment for MRSA infections. Performed at [facility], [place] [address] [place], [place] [address]   Gram stain     Status: None   Collection Time: 06/20/19 10:56 AM   Specimen: PATH Cytology Pleural fluid  Result Value Ref Range Status   Specimen Description FLUID  Final   Special Requests NONE  Final   Gram Stain   Final    CYTOSPIN SMEAR WBC PRESENT, PREDOMINANTLY MONONUCLEAR NO ORGANISMS SEEN Performed at [facility], [place] [address] [place], [place] [address]    Report Status 06/20/2019 FINAL  Final  Culture, body fluid-bottle     Status: None (Preliminary result)   Collection Time: 06/20/19 10:58 AM   Specimen: Fluid  Result Value Ref Range Status   Specimen Description FLUID  Final   Special Requests NONE  Final   Culture   Final    NO GROWTH 4 DAYS Performed at [place] [address] [place], [place] [address]    Report Status PENDING  Incomplete    Michel Bickers, MD [facility] for [facility] [phone] pager   [phone] cell 06/25/2019, 12:21 PM

## 2019-06-25 NOTE — Progress Notes (Signed)
Nutrition Follow up  DOCUMENTATION CODES:   Non-severe (moderate) malnutrition in context of chronic illness  INTERVENTION:    Ensure Enlive po BID, each supplement provides 350 kcal and 20 grams of protein  MVI daily   NUTRITION DIAGNOSIS:   Moderate Malnutrition related to chronic illness(disordered eating) as evidenced by mild fat depletion, moderate muscle depletion.  GOAL:   Patient will meet greater than or equal to 90% of their needs  MONITOR:   PO intake, Supplement acceptance, Weight trends, Labs, I & O's  REASON FOR ASSESSMENT:   Consult Assessment of nutrition requirement/status  ASSESSMENT:   Patient with PMH significant for depression, dysphagia, essential HTN, and RA. Presents this admission with large bilateral pleural effusions and R lung nodule.   Pt continues to diurese.  Spoke with family member at bedside. Reports pt has been on a "diet" for over 50 years and limits food intake daily. Pt feels like food is too salty at her facility and this prevents her from eating. Meal completions charted as 40-90% for her last 6 meals. SLP advanced her to regular diet yesterday. Denies issues with swallowing. Pt drinking Ensure off and on. Encouraged PO and supplement intake.   Suspect thoughts regarding food is contributing to malnutrition status. Pt meets criteria for moderate malnutrition at this time but is most likely severe. Will attempt to repeat NFPE at next follow once pt has diuresed.   Admission weight: 72.8 kg   Current weight: 62.2 kg  I/O: -14,576 ml since admit UOP: 2,150 ml x 24 hrs   Medications: folic acid, MVI with minerals Labs: Na 130 (L)   Diet Order:   Diet Order            Diet Heart Room service appropriate? Yes with Assist; Fluid consistency: Thin; Fluid restriction: 1500 mL Fluid  Diet effective now              EDUCATION NEEDS:   Education needs have been addressed  Skin:  Skin Assessment: Reviewed RN Assessment  Last  BM:  3/31  Height:   Ht Readings from Last 1 Encounters:  06/20/19 5\' 2"  (1.575 m)    Weight:   Wt Readings from Last 1 Encounters:  06/25/19 62.2 kg    BMI:  Body mass index is 25.08 kg/m.  Estimated Nutritional Needs:   Kcal:  1700-1900 kcal  Protein:  85-100 grams  Fluid:  >/= 1.7 L/day   Mariana Single RD, LDN Clinical Nutrition Pager listed in [place]

## 2019-06-26 DIAGNOSIS — L8990 Pressure ulcer of unspecified site, unspecified stage: Secondary | ICD-10-CM | POA: Insufficient documentation

## 2019-06-26 LAB — BASIC METABOLIC PANEL
Anion gap: 9 (ref 5–15)
BUN: 12 mg/dL (ref 8–23)
CO2: 34 mmol/L — ABNORMAL HIGH (ref 22–32)
Calcium: 8.3 mg/dL — ABNORMAL LOW (ref 8.9–10.3)
Chloride: 92 mmol/L — ABNORMAL LOW (ref 98–111)
Creatinine, Ser: 0.59 mg/dL (ref 0.44–1.00)
GFR calc Af Amer: >60 mL/min (ref >60)
GFR calc non Af Amer: >60 mL/min (ref >60)
Glucose, Bld: 96 mg/dL (ref 70–99)
Potassium: 3.8 mmol/L (ref 3.5–5.1)
Sodium: 135 mmol/L (ref 135–145)

## 2019-06-26 LAB — VITAMIN B12: Vitamin B-12: 554 pg/mL (ref 180–914)

## 2019-06-26 LAB — IRON AND TIBC
Iron: 32 ug/dL (ref 28–170)
Saturation Ratios: 14 % (ref 10.4–31.8)
TIBC: 231 ug/dL — ABNORMAL LOW (ref 250–450)
UIBC: 199 ug/dL

## 2019-06-26 LAB — FERRITIN: Ferritin: 373 ng/mL — ABNORMAL HIGH (ref 11–307)

## 2019-06-26 LAB — CBC
HCT: 27.3 % — ABNORMAL LOW (ref 36.0–46.0)
Hemoglobin: 8.4 g/dL — ABNORMAL LOW (ref 12.0–15.0)
MCH: 31.6 pg (ref 26.0–34.0)
MCHC: 30.8 g/dL (ref 30.0–36.0)
MCV: 102.6 fL — ABNORMAL HIGH (ref 80.0–100.0)
Platelets: 305 10*3/uL (ref 150–400)
RBC: 2.66 MIL/uL — ABNORMAL LOW (ref 3.87–5.11)
RDW: 16.2 % — ABNORMAL HIGH (ref 11.5–15.5)
WBC: 7.5 10*3/uL (ref 4.0–10.5)
nRBC: 0 % (ref 0.0–0.2)

## 2019-06-26 MED ORDER — LOSARTAN POTASSIUM 25 MG PO TABS: 12.5000 mg | ORAL_TABLET | Freq: Every day | ORAL | Status: DC

## 2019-06-26 NOTE — Progress Notes (Signed)
Progress Note  Patient Name: Samantha Keller Date of Encounter: 06/26/2019  Primary Cardiologist: New  Subjective   Breathing is OK  NO CP     Inpatient Medications    Scheduled Meds: . Chlorhexidine Gluconate Cloth  6 each Topical Daily  . cycloSPORINE  1 drop Both Eyes BID  . doxycycline  100 mg Oral BID  . DULoxetine  60 mg Oral Daily  . enoxaparin (LOVENOX) injection  40 mg Subcutaneous Q24H  . feeding supplement (ENSURE ENLIVE)  237 mL Oral BID BM  . folic acid  2 mg Oral Daily  . gabapentin  600 mg Oral TID  . losartan  12.5 mg Oral Daily  . melatonin  6 mg Oral QHS  . metoprolol succinate  25 mg Oral BID WC  . multivitamin with minerals  1 tablet Oral Daily  . pantoprazole  40 mg Oral BID  . sodium chloride flush  3 mL Intravenous Q12H  . sodium chloride flush  3 mL Intravenous Q12H   Continuous Infusions: . sodium chloride     PRN Meds: sodium chloride, acetaminophen **OR** acetaminophen, baclofen, lidocaine, polyethylene glycol, sodium chloride flush   Vital Signs    Vitals:   06/25/19 2200 06/25/19 2214 06/26/19 0500 06/26/19 0743  BP:   (!) 112/58   Pulse:   86 91  Resp:   18 15  Temp: 98.2 F (36.8 C) 98.2 F (36.8 C) (!) 97.5 F (36.4 C)   TempSrc: Oral Oral Oral   SpO2:   100% 100%  Weight:   60.8 kg   Height:        Intake/Output Summary (Last 24 hours) at 06/26/2019 0754 Last data filed at 06/25/2019 0900 Gross per 24 hour  Intake --  Output 600 ml  Net -600 ml   Net neg 14.4  L   Last 3 Weights 06/26/2019 06/25/2019 06/24/2019  Weight (lbs) 134 lb 0.6 oz 137 lb 2 oz 137 lb 9.1 oz  Weight (kg) 60.8 kg 62.2 kg 62.4 kg      Telemetry     SR - Personally Reviewed  ECG    No EKG- Personally Reviewed  Physical Exam   GEN: Thin 74 yo in no acute distress.   Neck: JVP is OK    Cardiac: RRR, no murmurs, rubs, or gallops.  Respiratory: Relatively clear   GI: Soft, nontender, non-distended  MS:  Triv t bilateral LE edema; No deformity.   Skin wrinkled   Neuro:  Nonfocal  Psych: Normal affect   Labs    High Sensitivity Troponin:   Recent Labs  Lab 06/19/19 1935  TROPONINIHS 12      Chemistry Recent Labs  Lab 06/23/19 0505 06/24/19 0548 06/25/19 0500  NA 138 134* 130*  K 3.7 3.4* 4.0  CL 94* 87* 87*  CO2 36* 38* 37*  GLUCOSE 98 98 95  BUN 15 13 13   CREATININE 0.84 0.61 0.85  CALCIUM 7.9* 8.0* 8.1*  PROT 5.6* 5.9* 5.7*  ALBUMIN 1.9* 2.0* 1.9*  AST 19 20 27   ALT <5 5 <5  ALKPHOS 132* 123 128*  BILITOT 0.8 0.8 0.8  GFRNONAA >60 >60 >60  GFRAA >60 >60 >60  ANIONGAP 8 9 6      Hematology Recent Labs  Lab 06/23/19 0505 06/24/19 0548 06/25/19 0500  WBC 8.4 9.8 8.8  RBC 2.80* 2.86* 2.76*  HGB 8.9* 9.3* 9.1*  HCT 28.9* 29.9* 28.7*  MCV 103.2* 104.5* 104.0*  MCH 31.8 32.5 33.0  MCHC 30.8 31.1 31.7  RDW 16.5* 16.5* 16.5*  PLT 278 331 325    BNP Recent Labs  Lab 06/19/19 1940  BNP 1,843.9*     DDimer No results for input(s): DDIMER in the last 168 hours.   Radiology    DG CHEST PORT 1 VIEW  Result Date: 06/25/2019 CLINICAL DATA:  Bilateral pleural effusions with continued shortness of breath. EXAM: PORTABLE CHEST 1 VIEW COMPARISON:  06/24/2019 FINDINGS: There is a right arm PICC line with tip at the cavoatrial junction. Or normal heart size. Decrease in left pleural effusion. Unchanged small to moderate right effusion. Unchanged appearance left lower lobe atelectasis. IMPRESSION: 1. Left lower lobe atelectasis. 2. Persistent right pleural effusion with decrease in left effusion. Electronically Signed   By: Kerby Moors M.D.   On: 06/25/2019 08:47    Cardiac Studies     Patient Profile     Samantha Keller is a 74 y.o. female with a hx of rheumatoid arthritis, recent extended admission in [place] with MSSA bacteremia,  who is being seen today for the evaluation of acute HF at the request of Dr . Alfredia Ferguson   Assessment & Plan    1. Acute systolic HF  LVEF 20 to 53%    This is new    Plan for medical Rx for now   No ischemic eval given 1. No CP and 2 other medical issues (GI bleed, recent infeciton, poor nutrition) Volume is signficantly improved   ReDS reading from yesterday was 17%     BP a little low   Hold am diuretics   CXR with improvement in effusion      Reveiwed with pt and family   WIth age 2 and new CHF would reocmm r/o CAD as cause   Discussed risks/benefits of R/L heart cath to define anatomy and pressures   Pt understands and agreees to proceed Pt is now finished ABX    Had ulcer in past but no signs of acitve bleedng     2  ID  Finished IV ABX   ID has signed off  3  GI   GI bleeding at outside hospital  Ulcer on EGD that was cauterized   No evid of bleeding    3. High TSH/normal Free T4 - per primary team  Plan for f/u in near future   4  Anemia  Hgb 8.4    Macrocytic  Will check Fe, ferr, B12    5  Malnutrition   Alb 1.9  Needs to build on nutrition     For questions or updates, please contact [place] Please consult [email or web address] for contact info under        Signed, [name], MD  06/26/2019, 7:54 AM

## 2019-06-26 NOTE — Plan of Care (Signed)
  Problem: Education: Goal: Knowledge of General Education information will improve Description Including pain rating scale, medication(s)/side effects and non-pharmacologic comfort measures Outcome: Progressing   Problem: Health Behavior/Discharge Planning: Goal: Ability to manage health-related needs will improve Outcome: Progressing   

## 2019-06-26 NOTE — Care Management Important Message (Signed)
Important Message  Patient Details  Name: Samantha Keller MRN: [id number] Date of Birth: 25-May-1945   Medicare Important Message Given:  Yes     Shelda Altes 06/26/2019, 9:13 AM

## 2019-06-26 NOTE — Progress Notes (Signed)
Pt family advised of visitor policy in depth. AC also called to confirm policy. PT's family still requesting another change to designated visitor. Family request denied due to multiple changes within this visit. Jerald Kief, RN

## 2019-06-26 NOTE — Progress Notes (Signed)
[facility]  PROGRESS NOTE  [name] OZH:[id number] DOB: 05/13/1945 DOA: 06/19/2019 PCP: Patient, [name] Admit date - 06/19/2019   Admitting Physician [name], MD  Outpatient Primary MD for the patient is Patient, [name]  LOS - 6 Brief Narrative   Samantha Keller is a 74 y.o. year old female with medical history significant for recent hospitalization for MSSA pneumonia complicated by bacteremia, left thigh cellulitis, bilateral hip abscesses pneumonia (CT chest showed cavitation of right upper lobe infiltrate) (status post I&D) treated with IV cefazolin  who presented on 06/19/2019 due to abnormal CT chest obtain an ID clinic for follow-up which showed large bilateral pleural effusions and complete collapse of left lower lobe with spiculated nodule right upper lobe.    Hospital course: Patient underwent IR guided thoracentesis (650 cc clear yellow fluid on (3/26,).  Cardiology was consulted for new acute systolic CHF.  Patient has been diuresing with IV Lasix    Subjective  Today sitting in bedside chair sitting in bedside chair.  Has no complaints.  Denies any chest pain, no shortness of breath.  Feels breathing is better  A & P    Acute systolic CHF, stable.  EF 20-25% on TTE -14 L during hospital stay.  Has 1+ pitting edema of lower extremities to upper thighs -Currently holding IV Lasix given soft BPs, BP stabilized -Appreciate cardiology recommendations, plan for right/left heart cath -Continue medical management with Toprol and losartan, closely monitor  Bilateral transudative/culture-negative pleural effusions with chronic hypoxic respiratory failure, multifactorial etiology (malnutrition/anasarca/CHF/recent MSSA pneumonia) related to above Baseline O2 requirements of 3 L prior to admission, currently on 1L with notable improvement in respiratory status/lung exam -Wean O2 as able -Replete potassium to maintain goal of 4  Hypertension, blood pressure  initially bit softer with SBP's in the 110s, improved from 24 hours ago -Likely exacerbated in setting of IV diuresis from earlier in hospital stay -Continue to hold off on IV Lasix as recommended by cardiology -Closely monitor BP -Parameters in place for Toprol 25 mg twice daily, continue losartan  MSSA bacteremia, pneumonia, soft tissue abscess/right hip infection, present on admission.  Has completed 6-week course of cefazolin (end date 3/31) -Appreciate ID recommendations -Patient can be removed prior to discharge -Outpatient follow up with Dr. Megan Salon on 07/21/2019 --?doxycycline  Severe/moderate malnutrition in setting of chronic illness -Appreciate nutrition recommendations, continue Ensure  Elevated TSH, normal free T4 -Euthyroid in setting of acute illness -We will need repeat TSH as outpatient.  Hyponatremia, in the setting of hypervolemia, improving.   -Closely monitor BMP, currently holding off on diuresis  Macrocytic anemia hemoglobin stable at baseline.  Has history of recent UGI bleed, no bleeding episodes here -Daily CBC -continue home folic acid -PPI  GERD, stable -Continue PPI  Peripheral neuropathy, stable-continue gabapentin  RA previously on Humira -Continue Cymbalta, gabapentin, as needed baclofen     Family Communication  : Sister updated at bedside on 3/31  Code Status : Full code  Disposition Plan  :  Patient is from rehab facility. Anticipated d/c date: 2 to 3 days. Barriers to d/c or necessity for inpatient status:  Close monitoring of blood pressure, cardiology plans for right and left heart cath for evaluation given new systolic chf Consults  : Radiology, infectious disease  Procedures  : IR guided thoracentesis, TTE  DVT Prophylaxis  :  Lovenox   Lab Results  Component Value Date   PLT 305 06/26/2019    Diet :  Diet Order            Diet Heart Room service appropriate? Yes with Assist; Fluid consistency: Thin; Fluid restriction:  1500 mL Fluid  Diet effective now               Inpatient Medications Scheduled Meds:  Chlorhexidine Gluconate Cloth  6 each Topical Daily   cycloSPORINE  1 drop Both Eyes BID   doxycycline  100 mg Oral BID   DULoxetine  60 mg Oral Daily   enoxaparin (LOVENOX) injection  40 mg Subcutaneous Q24H   feeding supplement (ENSURE ENLIVE)  237 mL Oral BID BM   folic acid  2 mg Oral Daily   gabapentin  600 mg Oral TID   losartan  12.5 mg Oral Daily   melatonin  6 mg Oral QHS   metoprolol succinate  25 mg Oral BID WC   multivitamin with minerals  1 tablet Oral Daily   pantoprazole  40 mg Oral BID   sodium chloride flush  3 mL Intravenous Q12H   sodium chloride flush  3 mL Intravenous Q12H   Continuous Infusions:  sodium chloride     PRN Meds:.sodium chloride, acetaminophen **OR** acetaminophen, baclofen, lidocaine, polyethylene glycol, sodium chloride flush  Antibiotics  :   Anti-infectives (From admission, onward)   Start     Dose/Rate Route Frequency Ordered Stop   06/19/19 2315  doxycycline (VIBRA-TABS) tablet 100 mg     100 mg Oral 2 times daily 06/19/19 2304     06/19/19 2315  ceFAZolin (ANCEF) IVPB 2g/100 mL premix     2 g 200 mL/hr over 30 Minutes Intravenous Every 8 hours 06/19/19 2306 06/26/19 1048       Objective   Vitals:   06/26/19 0800 06/26/19 0812 06/26/19 1244 06/26/19 1606  BP:  (!) 110/55 (!) 102/55 (!) 109/59  Pulse:  85 87 87  Resp:  18 20   Temp:  98.5 F (36.9 C) 97.6 F (36.4 C)   TempSrc:  Oral Oral   SpO2: 99%  100%   Weight:      Height:        SpO2: 100 % O2 Flow Rate (L/min): (2)  Wt Readings from Last 3 Encounters:  06/26/19 60.8 kg  06/11/19 74.9 kg     Intake/Output Summary (Last 24 hours) at 06/26/2019 1732 Last data filed at 06/26/2019 1708 Gross per 24 hour  Intake 342 ml  Output 900 ml  Net -558 ml    Physical Exam:     Awake Alert, Oriented X 3, elderly female in no distress No new F.N deficits,    [place].AT, Normal respiratory effort on 1 L nasal cannula, minimal crackles at bases, breath sounds heard throughout RRR,No Gallops,Rubs or new Murmurs,  +ve B.Sounds, Abd Soft, No tenderness, No rebound, guarding or rigidity. 1+ pitting edema bilateral lower extremities at calves, and slight pitting at thighs   I have personally reviewed the following:   Data Reviewed:  CBC Recent Labs  Lab 06/21/19 0422 06/21/19 0422 06/22/19 0449 06/23/19 0505 06/24/19 0548 06/25/19 0500 06/26/19 0700  WBC 8.0   < > 8.3 8.4 9.8 8.8 7.5  HGB 8.3*   < > 8.8* 8.9* 9.3* 9.1* 8.4*  HCT 27.2*   < > 28.4* 28.9* 29.9* 28.7* 27.3*  PLT 311   < > 316 278 331 325 305  MCV 105.4*   < > 103.3* 103.2* 104.5* 104.0* 102.6*  MCH 32.2   < > 32.0 31.8  32.5 33.0 31.6  MCHC 30.5   < > 31.0 30.8 31.1 31.7 30.8  RDW 17.2*   < > 16.7* 16.5* 16.5* 16.5* 16.2*  LYMPHSABS 3.2  --  3.0 3.2 3.6 3.4  --   MONOABS 0.6  --  0.8 0.7 0.8 0.8  --   EOSABS 0.3  --  0.4 0.4 0.4 0.4  --   BASOSABS 0.0  --  0.1 0.1 0.1 0.0  --    < > = values in this interval not displayed.    Chemistries  Recent Labs  Lab 06/21/19 0422 06/21/19 0422 06/22/19 0449 06/23/19 0505 06/24/19 0548 06/25/19 0500 06/26/19 0700  NA 136   < > 136 138 134* 130* 135  K 3.4*   < > 3.0* 3.7 3.4* 4.0 3.8  CL 99   < > 94* 94* 87* 87* 92*  CO2 29   < > 33* 36* 38* 37* 34*  GLUCOSE 95   < > 98 98 98 95 96  BUN 13   < > 16 15 13 13 12   CREATININE 0.73   < > 0.70 0.84 0.61 0.85 0.59  CALCIUM 8.1*   < > 8.0* 7.9* 8.0* 8.1* 8.3*  MG 1.6*  --  1.6* 1.7 1.7 1.8  --   AST 39  --  22 19 20 27   --   ALT <5  --  <5 <5 5 <5  --   ALKPHOS 154*  --  144* 132* 123 128*  --   BILITOT 0.9  --  0.7 0.8 0.8 0.8  --    < > = values in this interval not displayed.   ------------------------------------------------------------------------------------------------------------------ No results for input(s): CHOL, HDL, LDLCALC, TRIG, CHOLHDL, LDLDIRECT in the last 72  hours.  No results found for: HGBA1C ------------------------------------------------------------------------------------------------------------------ No results for input(s): TSH, T4TOTAL, T3FREE, THYROIDAB in the last 72 hours.  Invalid input(s): FREET3 ------------------------------------------------------------------------------------------------------------------ Recent Labs    06/26/19 1415  VITAMINB12 554  FERRITIN 373*  TIBC 231*  IRON 32    Coagulation profile Recent Labs  Lab 06/19/19 1935  INR 1.1    No results for input(s): DDIMER in the last 72 hours.  Cardiac Enzymes No results for input(s): CKMB, TROPONINI, MYOGLOBIN in the last 168 hours.  Invalid input(s): CK ------------------------------------------------------------------------------------------------------------------    Component Value Date/Time   BNP 1,843.9 (H) 06/19/2019 1940    Micro Results Recent Results (from the past 240 hour(s))  Respiratory Panel by RT PCR (Flu A&B, Covid) - Nasopharyngeal Swab     Status: None   Collection Time: 06/19/19  8:24 PM   Specimen: Nasopharyngeal Swab  Result Value Ref Range Status   SARS Coronavirus 2 by RT PCR NEGATIVE NEGATIVE Final    Comment: (NOTE) SARS-CoV-2 target nucleic acids are NOT DETECTED. The SARS-CoV-2 RNA is generally detectable in upper respiratoy specimens during the acute phase of infection. The lowest concentration of SARS-CoV-2 viral copies this assay can detect is 131 copies/mL. A negative result does not preclude SARS-Cov-2 infection and should not be used as the sole basis for treatment or other patient management decisions. A negative result may occur with  improper specimen collection/handling, submission of specimen other than nasopharyngeal swab, presence of viral mutation(s) within the areas targeted by this assay, and inadequate number of viral copies (<131 copies/mL). A negative result must be combined with  clinical observations, patient history, and epidemiological information. The expected result is Negative. Fact Sheet for Patients:  PinkCheek.be Fact Sheet for Healthcare  Providers:  GravelBags.it This test is not yet ap proved or cleared by the [place] and  has been authorized for detection and/or diagnosis of SARS-CoV-2 by FDA under an Emergency Use Authorization (EUA). This EUA will remain  in effect (meaning this test can be used) for the duration of the COVID-19 declaration under Section 564(b)(1) of the Act, 21 U.S.C. section 360bbb-3(b)(1), unless the authorization is terminated or revoked sooner.    Influenza A by PCR NEGATIVE NEGATIVE Final   Influenza B by PCR NEGATIVE NEGATIVE Final    Comment: (NOTE) The Xpert Xpress SARS-CoV-2/FLU/RSV assay is intended as an aid in  the diagnosis of influenza from Nasopharyngeal swab specimens and  should not be used as a sole basis for treatment. Nasal washings and  aspirates are unacceptable for Xpert Xpress SARS-CoV-2/FLU/RSV  testing. Fact Sheet for Patients: PinkCheek.be Fact Sheet for Healthcare Providers: GravelBags.it This test is not yet approved or cleared by the [place] FDA and  has been authorized for detection and/or diagnosis of SARS-CoV-2 by  FDA under an Emergency Use Authorization (EUA). This EUA will remain  in effect (meaning this test can be used) for the duration of the  Covid-19 declaration under Section 564(b)(1) of the Act, 21  U.S.C. section 360bbb-3(b)(1), unless the authorization is  terminated or revoked. Performed at [facility], [place] [address], [place], [place] [address]   Culture, blood (routine x 2)     Status: None   Collection Time: 06/19/19 11:25 PM   Specimen: BLOOD  Result Value Ref Range Status   Specimen Description BLOOD RIGHT ARM  Final   Special  Requests   Final    BOTTLES DRAWN AEROBIC AND ANAEROBIC Blood Culture adequate volume   Culture   Final    NO GROWTH 5 DAYS Performed at [facility], [address], [place], [place] [address]    Report Status 06/24/2019 FINAL  Final  Culture, blood (routine x 2)     Status: None   Collection Time: 06/19/19 11:30 PM   Specimen: BLOOD  Result Value Ref Range Status   Specimen Description BLOOD LEFT HAND  Final   Special Requests   Final    BOTTLES DRAWN AEROBIC AND ANAEROBIC Blood Culture adequate volume   Culture   Final    NO GROWTH 5 DAYS Performed at [facility], [place] [address], [place], [place] [address]    Report Status 06/24/2019 FINAL  Final  MRSA PCR Screening     Status: None   Collection Time: 06/20/19  8:59 AM   Specimen: Nasal Mucosa; Nasopharyngeal  Result Value Ref Range Status   MRSA by PCR NEGATIVE NEGATIVE Final    Comment:        The GeneXpert MRSA Assay (FDA approved for NASAL specimens only), is one component of a comprehensive MRSA colonization surveillance program. It is not intended to diagnose MRSA infection nor to guide or monitor treatment for MRSA infections. Performed at [facility], [place] [address], [place], [place] [address]   Gram stain     Status: None   Collection Time: 06/20/19 10:56 AM   Specimen: PATH Cytology Pleural fluid  Result Value Ref Range Status   Specimen Description FLUID  Final   Special Requests NONE  Final   Gram Stain   Final    CYTOSPIN SMEAR WBC PRESENT, PREDOMINANTLY MONONUCLEAR NO ORGANISMS SEEN Performed at [facility], [place] [address] [place], [place]  [address]    Report Status 06/20/2019 FINAL  Final  Culture, body fluid-bottle     Status: None   Collection Time: 06/20/19 10:58 AM   Specimen: Fluid  Result Value Ref Range Status   Specimen Description FLUID  Final   Special Requests NONE  Final   Culture   Final    NO GROWTH 5 DAYS Performed at [facility], [address], [place], [place] [address]    Report Status 06/25/2019 FINAL  Final    Radiology Reports DG Chest 1 View  Result Date: 06/20/2019 CLINICAL DATA:  Prior thoracentesis. EXAM: CHEST  1 VIEW COMPARISON:  CT 02/19/2020. FINDINGS: PICC line noted with tip over cavoatrial junction. Heart size normal. Interim decrease in left-sided pleural effusion following thoracentesis. No pneumothorax. Persistent right pleural effusion. Persistent bibasilar atelectasis. Prior cervical spine fusion. IMPRESSION: 1.  Right PICC line noted with tip over cavoatrial junction. 2. Near complete resolution of left-sided pleural effusion following thoracentesis. No evidence of pneumothorax. 3. Persistent right pleural effusion. Persistent bibasilar atelectasis. Electronically Signed   By: Marcello Moores  Register   On: 06/20/2019 09:59   CT CHEST W CONTRAST  Addendum Date: 06/25/2019   ADDENDUM REPORT: 06/25/2019 08:14 ADDENDUM: Prior images which include only plain film evaluations of the chest show evidence of effusions and basilar airspace disease with similar appearance. The CT comparison for the pulmonary nodule in the right upper lobe is not available to determine whether this is diminishing in size. Would suggest retrieval of prior images and short interval follow-up in 6-8 weeks from today's study which would be approximately 3 months from the initial exam. Comparison could be made to the more remote exams at that time. Electronically Signed   By: Zetta Bills M.D.   On: 06/25/2019 08:14   Result Date: 06/25/2019 CLINICAL DATA:  Abnormal x-ray. EXAM: CT CHEST WITH CONTRAST TECHNIQUE: Multidetector CT imaging of the chest was performed during intravenous contrast administration. CONTRAST:  60mL OMNIPAQUE IOHEXOL 300 MG/ML  SOLN COMPARISON:  None. FINDINGS: Cardiovascular: Heart size is enlarged without pericardial effusion. Aortic caliber is normal. Left vertebral arising directly from the aortic arch. Central pulmonary arteries  are engorged. Main pulmonary artery measuring approximately 2.9 cm. Not well evaluated given bolus timing. Mediastinum/Nodes: No adenopathy in the mediastinum or hilar areas with no axillary lymphadenopathy. Lungs/Pleura: Large bilateral pleural effusions. Spiculated nodule in the right upper lobe measures 12 by 11 mm (image 30, series 7) there is complete collapse of the left lower lobe and partial collapse of the lingula in the left chest. Partial collapse of the right lower lobe due to bilateral pleural effusions. Variable enhancement of collapsed lung at the lung bases also raising the question pneumonia with fluid in distal bronchi. Large airways are patent. Upper Abdomen: Incidental imaging of upper abdominal contents is unremarkable. Musculoskeletal: Diffuse body wall edema. Ruptured left breast implant with peripheral calcification. No chest wall mass. Signs of cervical spinal fusion with anterolisthesis of C7 on T1 and extensive degenerative changes at the cervicothoracic junction. No destructive bone finding. IMPRESSION: 1. Large bilateral pleural effusions with complete collapse of the left lower lobe and partial collapse of the lingula in the left chest. No current access to prior imaging which would allow for comparison. Currently in the process of attempting to retrieve prior imaging studies. 2. Variable enhancement of collapsed lung at the lung bases also raising the question of pneumonia with fluid in distal bronchi. This raises the question of current or prior  pneumonia. 3. Spiculated nodule in the right upper lobe measures 12 x 11 mm. This is suspicious for malignancy. Though by report the patient has a history of a nodule which may have been improving. 4. Anasarca with body wall edema in addition to effusions. These results will be called to the ordering clinician or representative by the Radiologist Assistant, and communication documented in the PACS or [employer]. Electronically Signed:  By: Zetta Bills M.D. On: 06/19/2019 17:12   DG CHEST PORT 1 VIEW  Result Date: 06/25/2019 CLINICAL DATA:  Bilateral pleural effusions with continued shortness of breath. EXAM: PORTABLE CHEST 1 VIEW COMPARISON:  06/24/2019 FINDINGS: There is a right arm PICC line with tip at the cavoatrial junction. Or normal heart size. Decrease in left pleural effusion. Unchanged small to moderate right effusion. Unchanged appearance left lower lobe atelectasis. IMPRESSION: 1. Left lower lobe atelectasis. 2. Persistent right pleural effusion with decrease in left effusion. Electronically Signed   By: Kerby Moors M.D.   On: 06/25/2019 08:47   DG CHEST PORT 1 VIEW  Result Date: 06/24/2019 CLINICAL DATA:  Shortness of breath EXAM: PORTABLE CHEST 1 VIEW COMPARISON:  Yesterday FINDINGS: Normal heart size and mediastinal contours. Right PICC with tip at the upper right atrium. Bilateral pleural effusion with borderline improvement on the right. The right effusion appears moderate and the left effusion more small volume. Streaky density at the lung bases. No visible pneumothorax IMPRESSION: Bilateral pleural effusion that are stable to mildly decreased. No new abnormality. Electronically Signed   By: Monte Fantasia M.D.   On: 06/24/2019 08:33   DG CHEST PORT 1 VIEW  Result Date: 06/23/2019 CLINICAL DATA:  Shortness of breath, large pleural effusion. EXAM: PORTABLE CHEST 1 VIEW COMPARISON:  06/21/2019 and CT chest 06/19/2019. FINDINGS: Right PICC terminates in the low SVC or at the SVC RA junction. Trachea is midline. Heart size stable. There are bilateral pleural effusions, large on the right and moderate on the left, similar to 06/21/2019. There may be mild mid and lower lung zone interstitial prominence and indistinctness. Right humeral head is high riding and there are degenerative changes in the right shoulder. IMPRESSION: 1. Bilateral pleural effusions, moderate on the right and small on the left, similar. 2.  Suspect mild pulmonary edema with bibasilar atelectasis. Electronically Signed   By: Lorin Picket M.D.   On: 06/23/2019 07:56   DG CHEST PORT 1 VIEW  Result Date: 06/21/2019 CLINICAL DATA:  Shortness of breath and pleural effusion EXAM: PORTABLE CHEST 1 VIEW COMPARISON:  June 20, 2019 FINDINGS: Right PICC line is identified unchanged. The mediastinal contour and cardiac silhouette are stable. Small to moderate left pleural effusion is increased compared prior exam. Moderate right pleural effusion is unchanged. Scoliosis with prior postsurgical change of cervical spine are noted unchanged. IMPRESSION: Small to moderate left pleural effusion increased compared prior exam. Moderate right pleural effusion is unchanged. Electronically Signed   By: Abelardo Diesel M.D.   On: 06/21/2019 11:19   ECHOCARDIOGRAM COMPLETE  Result Date: 06/20/2019    ECHOCARDIOGRAM REPORT   Patient Name:   LIZANN EDELMAN Haik Date of Exam: 06/20/2019 Medical Rec #:  [id number]      Height:       62.0 in Accession #:    [id number]     Weight:       160.5 lb Date of Birth:  [birth year]       BSA:          1.741 m Patient  Age:    70 years       BP:           112/63 mmHg Patient Gender: F              HR:           90 bpm. Exam Location:  Inpatient Procedure: 2D Echo Indications:    Dyspnea R06.00  History:        Patient has no prior history of Echocardiogram examinations.  Sonographer:    Mikki Santee RDCS (AE) Referring Phys: [id number] Georgina Quint LATIF [place]  1. Left ventricular ejection fraction, by estimation, is 20 to 25%. The left ventricle has severely decreased function. The left ventricle demonstrates global hypokinesis. Indeterminate diastolic filling due to E-A fusion.  2. Right ventricular systolic function is mildly reduced. The right ventricular size is moderately enlarged. There is moderately elevated pulmonary artery systolic pressure. The estimated right ventricular systolic pressure is 03.0 mmHg.  3. Right atrial size  was mild to moderately dilated.  4. The mitral valve is grossly normal. Mild to moderate mitral valve regurgitation.  5. Tricuspid valve regurgitation is moderate.  6. The aortic valve is tricuspid. Aortic valve regurgitation is not visualized. No aortic stenosis is present.  7. The inferior vena cava is dilated in size with <50% respiratory variability, suggesting right atrial pressure of 15 mmHg. FINDINGS  Left Ventricle: Left ventricular ejection fraction, by estimation, is 20 to 25%. The left ventricle has severely decreased function. The left ventricle demonstrates global hypokinesis. The left ventricular internal cavity size was normal in size. There is no left ventricular hypertrophy. Abnormal (paradoxical) septal motion, consistent with left bundle branch block. Indeterminate diastolic filling due to E-A fusion. Right Ventricle: The right ventricular size is moderately enlarged. No increase in right ventricular wall thickness. Right ventricular systolic function is mildly reduced. There is moderately elevated pulmonary artery systolic pressure. The tricuspid regurgitant velocity is 3.08 m/s, and with an assumed right atrial pressure of 15 mmHg, the estimated right ventricular systolic pressure is 09.2 mmHg. Left Atrium: Left atrial size was normal in size. Right Atrium: Right atrial size was mild to moderately dilated. Pericardium: There is no evidence of pericardial effusion. Presence of pericardial fat pad. Mitral Valve: The mitral valve is grossly normal. Mild to moderate mitral valve regurgitation. Tricuspid Valve: The tricuspid valve is grossly normal. Tricuspid valve regurgitation is moderate. Aortic Valve: The aortic valve is tricuspid. Aortic valve regurgitation is not visualized. No aortic stenosis is present. Pulmonic Valve: The pulmonic valve was grossly normal. Pulmonic valve regurgitation is not visualized. No evidence of pulmonic stenosis. Aorta: The aortic root and ascending aorta are  structurally normal, with no evidence of dilitation. Venous: The inferior vena cava is dilated in size with less than 50% respiratory variability, suggesting right atrial pressure of 15 mmHg. IAS/Shunts: The atrial septum is grossly normal. Additional Comments: A venous catheter is visualized in the right atrium. There is a small pleural effusion in the left lateral region.  LEFT VENTRICLE PLAX 2D LVIDd:         4.50 cm     Diastology LVIDs:         3.60 cm     LV e' lateral:   6.30 cm/s LV PW:         1.00 cm     LV E/e' lateral: 12.9 LV IVS:        0.80 cm     LV e' medial:  4.06 cm/s LVOT diam:     1.90 cm     LV E/e' medial:  20.0 LV SV:         31 LV SV Index:   18 LVOT Area:     2.84 cm  LV Volumes (MOD) LV vol d, MOD A2C: 79.9 ml LV vol d, MOD A4C: 72.4 ml LV vol s, MOD A2C: 51.7 ml LV vol s, MOD A4C: 45.7 ml LV SV MOD A2C:     28.2 ml LV SV MOD A4C:     72.4 ml LV SV MOD BP:      29.5 ml RIGHT VENTRICLE RV S prime:     8.70 cm/s TAPSE (M-mode): 1.2 cm LEFT ATRIUM             Index       RIGHT ATRIUM           Index LA diam:        3.50 cm 2.01 cm/m  RA Area:     16.90 cm LA Vol (A2C):   36.2 ml 20.79 ml/m RA Volume:   50.10 ml  28.78 ml/m LA Vol (A4C):   41.3 ml 23.72 ml/m LA Biplane Vol: 38.8 ml 22.29 ml/m  AORTIC VALVE LVOT Vmax:   70.00 cm/s LVOT Vmean:  44.400 cm/s LVOT VTI:    0.108 m  AORTA Ao Root diam: 2.30 cm Ao Asc diam:  3.00 cm MITRAL VALVE               TRICUSPID VALVE MV Area (PHT): 3.99 cm    TR Peak grad:   37.9 mmHg MV Decel Time: 190 msec    TR Vmax:        308.00 cm/s MR Peak grad: 33.6 mmHg MR Vmax:      290.00 cm/s  SHUNTS MV E velocity: 81.00 cm/s  Systemic VTI:  0.11 m MV A velocity: 72.20 cm/s  Systemic Diam: 1.90 cm MV E/A ratio:  1.12 Eleonore Chiquito MD Electronically signed by Eleonore Chiquito MD Signature Date/Time: 06/20/2019/4:09:00 PM    Final    VAS [place] LOWER EXTREMITY VENOUS (DVT)  Result Date: 06/22/2019  Lower Venous DVTStudy Indications: Edema.  Comparison Study: no  prior study Performing Technologist: Sharion Dove RVS  Examination Guidelines: A complete evaluation includes B-mode imaging, spectral Doppler, color Doppler, and power Doppler as needed of all accessible portions of each vessel. Bilateral testing is considered an integral part of a complete examination. Limited examinations for reoccurring indications may be performed as noted. The reflux portion of the exam is performed with the patient in reverse Trendelenburg.  +---------+---------------+---------+-----------+----------+--------------+  RIGHT     Compressibility Phasicity Spontaneity Properties Thrombus Aging  +---------+---------------+---------+-----------+----------+--------------+  CFV       Full                                             pulsatile       +---------+---------------+---------+-----------+----------+--------------+  SFJ       Full                                                             +---------+---------------+---------+-----------+----------+--------------+  FV Prox  Full                                                             +---------+---------------+---------+-----------+----------+--------------+  FV Mid    Full                                                             +---------+---------------+---------+-----------+----------+--------------+  FV Distal Full                                                             +---------+---------------+---------+-----------+----------+--------------+  PFV       Full                                                             +---------+---------------+---------+-----------+----------+--------------+  POP       Full                                             pulsatile       +---------+---------------+---------+-----------+----------+--------------+  PTV       Full                                                             +---------+---------------+---------+-----------+----------+--------------+  PERO      Full                                                              +---------+---------------+---------+-----------+----------+--------------+   +---------+---------------+---------+-----------+----------+--------------+  LEFT      Compressibility Phasicity Spontaneity Properties Thrombus Aging  +---------+---------------+---------+-----------+----------+--------------+  CFV       Full                                             pulsatile       +---------+---------------+---------+-----------+----------+--------------+  SFJ       Full                                                             +---------+---------------+---------+-----------+----------+--------------+  FV Prox   Full                                                             +---------+---------------+---------+-----------+----------+--------------+  FV Mid    Full                                                             +---------+---------------+---------+-----------+----------+--------------+  FV Distal Full                                                             +---------+---------------+---------+-----------+----------+--------------+  PFV       Full                                                             +---------+---------------+---------+-----------+----------+--------------+  POP       Full                                             pulsatile       +---------+---------------+---------+-----------+----------+--------------+  PTV       Full                                                             +---------+---------------+---------+-----------+----------+--------------+  PERO      Full                                                             +---------+---------------+---------+-----------+----------+--------------+     Summary: BILATERAL: - No evidence of deep vein thrombosis seen in the lower extremities, bilaterally.   *See table(s) above for measurements and observations. Electronically signed by Deitra Mayo MD on 06/22/2019 at  6:39:29 PM.    Final    IR THORACENTESIS ASP PLEURAL SPACE W/IMG GUIDE  Result Date: 06/20/2019 INDICATION: Large bilateral pleural effusions with complete left lower lobe collapse, partial collapse of lingula, and spiculated right upper lobe nodule. Request for diagnostic and therapeutic thoracentesis. EXAM: ULTRASOUND GUIDED LEFT THORACENTESIS MEDICATIONS: 1% lidocaine 10 mL COMPLICATIONS: None immediate. PROCEDURE: An ultrasound guided thoracentesis was thoroughly discussed with the patient and questions answered. The benefits, risks, alternatives and complications were also discussed. The patient understands and wishes to proceed with the procedure.  Written consent was obtained. Ultrasound was performed to localize and mark an adequate pocket of fluid in the left chest. The area was then prepped and draped in the normal sterile fashion. 1% Lidocaine was used for local anesthesia. Under ultrasound guidance a 6 Fr Safe-T-Centesis catheter was introduced. Thoracentesis was performed. The catheter was removed and a dressing applied. FINDINGS: A total of approximately 650 mL of clear yellow fluid was removed. Samples were sent to the laboratory as requested by the clinical team. IMPRESSION: Successful ultrasound guided left thoracentesis yielding 650 mL of pleural fluid. No pneumothorax on post-procedure chest x-ray. Read by: Gareth Eagle, PA-C Electronically Signed   By: Lucrezia Europe M.D.   On: 06/20/2019 10:11     Time Spent in minutes  30     Desiree Hane M.D on 06/26/2019 at 5:32 PM  To page go to [email or web address] - password [facility]

## 2019-06-26 NOTE — H&P (View-Only) (Signed)
Progress Note  Patient Name: ANTWAN BRIBIESCA Date of Encounter: 06/26/2019  Primary Cardiologist: New  Subjective   Breathing is OK  NO CP     Inpatient Medications    Scheduled Meds: . Chlorhexidine Gluconate Cloth  6 each Topical Daily  . cycloSPORINE  1 drop Both Eyes BID  . doxycycline  100 mg Oral BID  . DULoxetine  60 mg Oral Daily  . enoxaparin (LOVENOX) injection  40 mg Subcutaneous Q24H  . feeding supplement (ENSURE ENLIVE)  237 mL Oral BID BM  . folic acid  2 mg Oral Daily  . gabapentin  600 mg Oral TID  . losartan  12.5 mg Oral Daily  . melatonin  6 mg Oral QHS  . metoprolol succinate  25 mg Oral BID WC  . multivitamin with minerals  1 tablet Oral Daily  . pantoprazole  40 mg Oral BID  . sodium chloride flush  3 mL Intravenous Q12H  . sodium chloride flush  3 mL Intravenous Q12H   Continuous Infusions: . sodium chloride     PRN Meds: sodium chloride, acetaminophen **OR** acetaminophen, baclofen, lidocaine, polyethylene glycol, sodium chloride flush   Vital Signs    Vitals:   06/25/19 2200 06/25/19 2214 06/26/19 0500 06/26/19 0743  BP:   (!) 112/58   Pulse:   86 91  Resp:   18 15  Temp: 98.2 F (36.8 C) 98.2 F (36.8 C) (!) 97.5 F (36.4 C)   TempSrc: Oral Oral Oral   SpO2:   100% 100%  Weight:   60.8 kg   Height:        Intake/Output Summary (Last 24 hours) at 06/26/2019 0754 Last data filed at 06/25/2019 0900 Gross per 24 hour  Intake --  Output 600 ml  Net -600 ml   Net neg 14.4  L   Last 3 Weights 06/26/2019 06/25/2019 06/24/2019  Weight (lbs) 134 lb 0.6 oz 137 lb 2 oz 137 lb 9.1 oz  Weight (kg) 60.8 kg 62.2 kg 62.4 kg      Telemetry     SR - Personally Reviewed  ECG    No EKG- Personally Reviewed  Physical Exam   GEN: Thin 74 yo in no acute distress.   Neck: JVP is OK    Cardiac: RRR, no murmurs, rubs, or gallops.  Respiratory: Relatively clear   GI: Soft, nontender, non-distended  MS:  Triv t bilateral LE edema; No deformity.   Skin wrinkled   Neuro:  Nonfocal  Psych: Normal affect   Labs    High Sensitivity Troponin:   Recent Labs  Lab 06/19/19 1935  TROPONINIHS 12      Chemistry Recent Labs  Lab 06/23/19 0505 06/24/19 0548 06/25/19 0500  NA 138 134* 130*  K 3.7 3.4* 4.0  CL 94* 87* 87*  CO2 36* 38* 37*  GLUCOSE 98 98 95  BUN 15 13 13   CREATININE 0.84 0.61 0.85  CALCIUM 7.9* 8.0* 8.1*  PROT 5.6* 5.9* 5.7*  ALBUMIN 1.9* 2.0* 1.9*  AST 19 20 27   ALT <5 5 <5  ALKPHOS 132* 123 128*  BILITOT 0.8 0.8 0.8  GFRNONAA >60 >60 >60  GFRAA >60 >60 >60  ANIONGAP 8 9 6      Hematology Recent Labs  Lab 06/23/19 0505 06/24/19 0548 06/25/19 0500  WBC 8.4 9.8 8.8  RBC 2.80* 2.86* 2.76*  HGB 8.9* 9.3* 9.1*  HCT 28.9* 29.9* 28.7*  MCV 103.2* 104.5* 104.0*  MCH 31.8 32.5 33.0  MCHC 30.8 31.1 31.7  RDW 16.5* 16.5* 16.5*  PLT 278 331 325    BNP Recent Labs  Lab 06/19/19 1940  BNP 1,843.9*     DDimer No results for input(s): DDIMER in the last 168 hours.   Radiology    DG CHEST PORT 1 VIEW  Result Date: 06/25/2019 CLINICAL DATA:  Bilateral pleural effusions with continued shortness of breath. EXAM: PORTABLE CHEST 1 VIEW COMPARISON:  06/24/2019 FINDINGS: There is a right arm PICC line with tip at the cavoatrial junction. Or normal heart size. Decrease in left pleural effusion. Unchanged small to moderate right effusion. Unchanged appearance left lower lobe atelectasis. IMPRESSION: 1. Left lower lobe atelectasis. 2. Persistent right pleural effusion with decrease in left effusion. Electronically Signed   By: Kerby Moors M.D.   On: 06/25/2019 08:47    Cardiac Studies     Patient Profile     ARABIA NYLUND is a 74 y.o. female with a hx of rheumatoid arthritis, recent extended admission in [place] with MSSA bacteremia,  who is being seen today for the evaluation of acute HF at the request of Dr . Alfredia Ferguson   Assessment & Plan    1. Acute systolic HF  LVEF 20 to 40%    This is new    Plan for medical Rx for now   No ischemic eval given 1. No CP and 2 other medical issues (GI bleed, recent infeciton, poor nutrition) Volume is signficantly improved   ReDS reading from yesterday was 17%     BP a little low   Hold am diuretics   CXR with improvement in effusion      Reveiwed with pt and family   WIth age 76 and new CHF would reocmm r/o CAD as cause   Discussed risks/benefits of R/L heart cath to define anatomy and pressures   Pt understands and agreees to proceed Pt is now finished ABX    Had ulcer in past but no signs of acitve bleedng     2  ID  Finished IV ABX   ID has signed off  3  GI   GI bleeding at outside hospital  Ulcer on EGD that was cauterized   No evid of bleeding    3. High TSH/normal Free T4 - per primary team  Plan for f/u in near future   4  Anemia  Hgb 8.4    Macrocytic  Will check Fe, ferr, B12    5  Malnutrition   Alb 1.9  Needs to build on nutrition     For questions or updates, please contact [place] Please consult [email or web address] for contact info under        Signed, [name], MD  06/26/2019, 7:54 AM

## 2019-06-26 NOTE — Progress Notes (Signed)
PT scratching skin making it bleed on R posterior hip and L posterior leg. All dressings changed and cleaned.  Jerald Kief, RN

## 2019-06-27 ENCOUNTER — Encounter (HOSPITAL_COMMUNITY)
Admission: EM | Disposition: A | Discharge status: Skilled Nursing Facility | Payer: Self-pay | Source: Home / Self Care | Attending: Internal Medicine

## 2019-06-27 DIAGNOSIS — J9620 Acute and chronic respiratory failure, unspecified whether with hypoxia or hypercapnia: Secondary | ICD-10-CM | POA: Diagnosis present

## 2019-06-27 HISTORY — PX: RIGHT/LEFT HEART CATH AND CORONARY ANGIOGRAPHY: CATH118266

## 2019-06-27 LAB — POCT I-STAT 7, (LYTES, BLD GAS, ICA,H+H)
Acid-Base Excess: 10 mmol/L — ABNORMAL HIGH (ref 0.0–2.0)
Bicarbonate: 34.6 mmol/L — ABNORMAL HIGH (ref 20.0–28.0)
Calcium, Ion: 1.16 mmol/L (ref 1.15–1.40)
HCT: 29 % — ABNORMAL LOW (ref 36.0–46.0)
Hemoglobin: 9.9 g/dL — ABNORMAL LOW (ref 12.0–15.0)
O2 Saturation: 99 %
Potassium: 4.1 mmol/L (ref 3.5–5.1)
Sodium: 135 mmol/L (ref 135–145)
TCO2: 36 mmol/L — ABNORMAL HIGH (ref 22–32)
pCO2 arterial: 47.6 mmHg (ref 32.0–48.0)
pH, Arterial: 7.469 — ABNORMAL HIGH (ref 7.350–7.450)
pO2, Arterial: 132 mmHg — ABNORMAL HIGH (ref 83.0–108.0)

## 2019-06-27 LAB — CBC
HCT: 28 % — ABNORMAL LOW (ref 36.0–46.0)
Hemoglobin: 8.9 g/dL — ABNORMAL LOW (ref 12.0–15.0)
MCH: 32.4 pg (ref 26.0–34.0)
MCHC: 31.8 g/dL (ref 30.0–36.0)
MCV: 101.8 fL — ABNORMAL HIGH (ref 80.0–100.0)
Platelets: 315 10*3/uL (ref 150–400)
RBC: 2.75 MIL/uL — ABNORMAL LOW (ref 3.87–5.11)
RDW: 15.9 % — ABNORMAL HIGH (ref 11.5–15.5)
WBC: 8.4 10*3/uL (ref 4.0–10.5)
nRBC: 0 % (ref 0.0–0.2)

## 2019-06-27 LAB — POCT I-STAT EG7
Acid-Base Excess: 9 mmol/L — ABNORMAL HIGH (ref 0.0–2.0)
Bicarbonate: 34.5 mmol/L — ABNORMAL HIGH (ref 20.0–28.0)
Calcium, Ion: 1.21 mmol/L (ref 1.15–1.40)
HCT: 29 % — ABNORMAL LOW (ref 36.0–46.0)
Hemoglobin: 9.9 g/dL — ABNORMAL LOW (ref 12.0–15.0)
O2 Saturation: 72 %
Potassium: 4.3 mmol/L (ref 3.5–5.1)
Sodium: 134 mmol/L — ABNORMAL LOW (ref 135–145)
TCO2: 36 mmol/L — ABNORMAL HIGH (ref 22–32)
pCO2, Ven: 52 mmHg (ref 44.0–60.0)
pH, Ven: 7.43 (ref 7.250–7.430)
pO2, Ven: 38 mmHg (ref 32.0–45.0)

## 2019-06-27 LAB — BASIC METABOLIC PANEL
Anion gap: 9 (ref 5–15)
BUN: 15 mg/dL (ref 8–23)
CO2: 32 mmol/L (ref 22–32)
Calcium: 8.4 mg/dL — ABNORMAL LOW (ref 8.9–10.3)
Chloride: 93 mmol/L — ABNORMAL LOW (ref 98–111)
Creatinine, Ser: 0.57 mg/dL (ref 0.44–1.00)
GFR calc Af Amer: >60 mL/min (ref >60)
GFR calc non Af Amer: >60 mL/min (ref >60)
Glucose, Bld: 95 mg/dL (ref 70–99)
Potassium: 3.9 mmol/L (ref 3.5–5.1)
Sodium: 134 mmol/L — ABNORMAL LOW (ref 135–145)

## 2019-06-27 LAB — CARDIAC CATHETERIZATION: Cath EF Quantitative: 35 %

## 2019-06-27 SURGERY — RIGHT/LEFT HEART CATH AND CORONARY ANGIOGRAPHY
Anesthesia: LOCAL

## 2019-06-27 MED ORDER — METHYLPREDNISOLONE SODIUM SUCC 40 MG IJ SOLR: 40.0000 mg | INTRAMUSCULAR | Status: DC

## 2019-06-27 MED ORDER — SODIUM CHLORIDE 0.9 % IV SOLN: INTRAVENOUS | Status: DC

## 2019-06-27 MED ORDER — DIPHENHYDRAMINE HCL 50 MG/ML IJ SOLN: 50.0000 mg | Freq: Once | INTRAMUSCULAR | Status: DC

## 2019-06-27 MED ORDER — SODIUM CHLORIDE 0.9% FLUSH
3.0000 mL | Freq: Two times a day (BID) | INTRAVENOUS | Status: DC
  Administered 2019-06-28 – 2019-06-30 (×5): 3 mL via INTRAVENOUS

## 2019-06-27 MED ORDER — SODIUM CHLORIDE 0.9 % WEIGHT BASED INFUSION
1.0000 mL/kg/h | INTRAVENOUS | Status: AC
  Administered 2019-06-27: 1 mL/kg/h via INTRAVENOUS

## 2019-06-27 MED ORDER — ASPIRIN 81 MG PO CHEW
81.0000 mg | CHEWABLE_TABLET | ORAL | Status: AC
  Administered 2019-06-27: 81 mg via ORAL
  Filled 2019-06-27: qty 1

## 2019-06-27 MED ORDER — ACETAMINOPHEN 500 MG PO TABS: 1000.0000 mg | ORAL_TABLET | Freq: Four times a day (QID) | ORAL | Status: DC | PRN

## 2019-06-27 MED ORDER — SACUBITRIL-VALSARTAN 24-26 MG PO TABS
1.0000 | ORAL_TABLET | Freq: Two times a day (BID) | ORAL | Status: DC
  Administered 2019-06-27 – 2019-07-01 (×9): 1 via ORAL
  Filled 2019-06-27 (×11): qty 1

## 2019-06-27 MED ORDER — ASPIRIN 81 MG PO CHEW: 81.0000 mg | CHEWABLE_TABLET | ORAL | Status: DC

## 2019-06-27 MED ORDER — DIPHENHYDRAMINE HCL 25 MG PO CAPS: 50.0000 mg | ORAL_CAPSULE | Freq: Once | ORAL | Status: DC

## 2019-06-27 MED ORDER — HEPARIN (PORCINE) IN NACL 1000-0.9 UT/500ML-% IV SOLN
INTRAVENOUS | Status: DC | PRN
  Administered 2019-06-27 (×2): 500 mL

## 2019-06-27 MED ORDER — SODIUM CHLORIDE 0.9% FLUSH: 3.0000 mL | Freq: Two times a day (BID) | INTRAVENOUS | Status: DC

## 2019-06-27 MED ORDER — SODIUM CHLORIDE 0.9% FLUSH: 3.0000 mL | INTRAVENOUS | Status: DC | PRN

## 2019-06-27 MED ORDER — DIPHENHYDRAMINE HCL 25 MG PO CAPS
50.0000 mg | ORAL_CAPSULE | Freq: Once | ORAL | Status: AC
  Administered 2019-06-27: 50 mg via ORAL
  Filled 2019-06-27: qty 2

## 2019-06-27 MED ORDER — ACETAMINOPHEN 325 MG PO TABS: 650.0000 mg | ORAL_TABLET | ORAL | Status: DC | PRN

## 2019-06-27 MED ORDER — ONDANSETRON HCL 4 MG/2ML IJ SOLN: 4.0000 mg | Freq: Four times a day (QID) | INTRAMUSCULAR | Status: DC | PRN

## 2019-06-27 MED ORDER — HEPARIN (PORCINE) IN NACL 1000-0.9 UT/500ML-% IV SOLN
INTRAVENOUS | Status: AC
  Filled 2019-06-27: qty 1000

## 2019-06-27 MED ORDER — LIDOCAINE HCL (PF) 1 % IJ SOLN
INTRAMUSCULAR | Status: DC | PRN
  Administered 2019-06-27: 18 mL via INTRADERMAL

## 2019-06-27 MED ORDER — SODIUM CHLORIDE 0.9% FLUSH
3.0000 mL | INTRAVENOUS | Status: DC | PRN
  Administered 2019-06-30: 3 mL via INTRAVENOUS

## 2019-06-27 MED ORDER — ENOXAPARIN SODIUM 40 MG/0.4ML ~~LOC~~ SOLN
40.0000 mg | SUBCUTANEOUS | Status: DC
  Administered 2019-06-28 – 2019-07-01 (×4): 40 mg via SUBCUTANEOUS
  Filled 2019-06-27 (×4): qty 0.4

## 2019-06-27 MED ORDER — FUROSEMIDE 10 MG/ML IJ SOLN
60.0000 mg | Freq: Once | INTRAMUSCULAR | Status: AC
  Administered 2019-06-27: 60 mg via INTRAVENOUS
  Filled 2019-06-27: qty 6

## 2019-06-27 MED ORDER — DIPHENHYDRAMINE HCL 50 MG/ML IJ SOLN: 50.0000 mg | Freq: Once | INTRAMUSCULAR | Status: AC

## 2019-06-27 MED ORDER — METHYLPREDNISOLONE SODIUM SUCC 125 MG IJ SOLR
125.0000 mg | Freq: Once | INTRAMUSCULAR | Status: AC
  Administered 2019-06-27: 125 mg via INTRAVENOUS
  Filled 2019-06-27: qty 2

## 2019-06-27 MED ORDER — IOHEXOL 350 MG/ML SOLN
INTRAVENOUS | Status: DC | PRN
  Administered 2019-06-27: 50 mL via INTRA_ARTERIAL

## 2019-06-27 MED ORDER — LIDOCAINE HCL (PF) 1 % IJ SOLN
INTRAMUSCULAR | Status: AC
  Filled 2019-06-27: qty 30

## 2019-06-27 MED ORDER — SODIUM CHLORIDE 0.9 % IV SOLN: 250.0000 mL | INTRAVENOUS | Status: DC | PRN

## 2019-06-27 SURGICAL SUPPLY — 12 items
CATH 5FR JL3.5 JR4 ANG PIG MP (CATHETERS) ×1 IMPLANT
CATH SWAN GANZ 7F STRAIGHT (CATHETERS) ×1 IMPLANT
CLOSURE MYNX CONTROL 5F (Vascular Products) ×1 IMPLANT
GLIDESHEATH SLEND SS 6F .021 (SHEATH) IMPLANT
KIT HEART LEFT (KITS) ×2 IMPLANT
PACK CARDIAC CATHETERIZATION (CUSTOM PROCEDURE TRAY) ×2 IMPLANT
SHEATH GLIDE SLENDER 4/5FR (SHEATH) IMPLANT
SHEATH PINNACLE 5F 10CM (SHEATH) ×1 IMPLANT
SHEATH PINNACLE 7F 10CM (SHEATH) ×1 IMPLANT
TRANSDUCER W/STOPCOCK (MISCELLANEOUS) ×2 IMPLANT
TUBING CIL FLEX 10 FLL-RA (TUBING) ×2 IMPLANT
WIRE EMERALD 3MM-J .035X150CM (WIRE) ×1 IMPLANT

## 2019-06-27 NOTE — Evaluation (Signed)
Physical Therapy Evaluation Patient Details Name: Samantha Keller MRN: [id number] DOB: 05/25/1945 Today's Date: 06/27/2019   History of Present Illness  Pt is a 74 y/o female admitted secondary to bilateral pleural effusions. Pt is s/p thoracentesis. Found to have new CHF. Pt with recent hospitalization for MSSA pneumonia complicated by bacteremia, left thigh cellulitis, bilateral hip abscesses, pneumonia. PMH includes RA and HTN.  Pt is s/p heart cath.   Clinical Impression  Pt admitted secondary to problem above with deficits below. Pt requiring mod A to stand and transfer to [facility] and back to bed. Pt from SNF and was working with therapies on ambulation. Recommend return to SNF at d/c. Will continue to follow acutely to maximize functional mobility independence and safety.     Follow Up Recommendations SNF;Supervision/Assistance - 24 hour    Equipment Recommendations  None recommended by PT    Recommendations for Other Services       Precautions / Restrictions Precautions Precautions: Fall Restrictions Weight Bearing Restrictions: No      Mobility  Bed Mobility Overal bed mobility: Needs Assistance Bed Mobility: Sit to Supine;Supine to Sit     Supine to sit: Mod assist Sit to supine: Mod assist   General bed mobility comments: Mod A for trunk assist and LE assist.   Transfers Overall transfer level: Needs assistance Equipment used: Rolling walker (2 wheeled) Transfers: Stand Pivot Transfers;Sit to/from Stand Sit to Stand: Mod assist Stand pivot transfers: Mod assist       General transfer comment: Mod A for lift assist and steadying to [facility] and back to bed. Initially with posterior lean, but able to correct with assist. Cues for safe hand placement.   Ambulation/Gait                Stairs            Wheelchair Mobility    Modified Rankin (Stroke Patients Only)       Balance Overall balance assessment: Needs assistance Sitting-balance support: No  upper extremity supported;Feet supported Sitting balance-Leahy Scale: Fair     Standing balance support: Bilateral upper extremity supported;During functional activity Standing balance-Leahy Scale: Poor Standing balance comment: Reliant on BUE support                              Pertinent Vitals/Pain Pain Assessment: Faces Faces Pain Scale: Hurts even more Pain Location: R groin Pain Descriptors / Indicators: Aching;Grimacing;Guarding Pain Intervention(s): Monitored during session;Limited activity within patient's tolerance;Repositioned    Home Living Family/patient expects to be discharged to:: Skilled nursing facility                      Prior Function Level of Independence: Needs assistance   Gait / Transfers Assistance Needed: Was working with PT at SNF on ambulation and transfers  ADL's / Homemaking Assistance Needed: Required assist for ADL tasks from staff        Hand Dominance        Extremity/Trunk Assessment   Upper Extremity Assessment Upper Extremity Assessment: Defer to OT evaluation    Lower Extremity Assessment Lower Extremity Assessment: Generalized weakness    Cervical / Trunk Assessment Cervical / Trunk Assessment: Normal  Communication   Communication: No difficulties  Cognition Arousal/Alertness: Awake/alert Behavior During Therapy: WFL for tasks assessed/performed Overall Cognitive Status: Within Functional Limits for tasks assessed  General Comments      Exercises     Assessment/Plan    PT Assessment Patient needs continued PT services  PT Problem List Decreased strength;Decreased balance;Decreased mobility;Decreased activity tolerance       PT Treatment Interventions Gait training;Functional mobility training;Therapeutic activities;DME instruction;Therapeutic exercise;Balance training;Patient/family education    PT Goals (Current goals can be found in  the Care Plan section)  Acute Rehab PT Goals Patient Stated Goal: to get better PT Goal Formulation: With patient Time For Goal Achievement: 07/11/19 Potential to Achieve Goals: Good    Frequency Min 2X/week   Barriers to discharge        Co-evaluation               AM-PAC PT "6 Clicks" Mobility  Outcome Measure Help needed turning from your back to your side while in a flat bed without using bedrails?: A Lot Help needed moving from lying on your back to sitting on the side of a flat bed without using bedrails?: A Lot Help needed moving to and from a bed to a chair (including a wheelchair)?: A Lot Help needed standing up from a chair using your arms (e.g., wheelchair or bedside chair)?: A Lot Help needed to walk in hospital room?: A Lot Help needed climbing 3-5 steps with a railing? : Total 6 Click Score: 11    End of Session Equipment Utilized During Treatment: Gait belt Activity Tolerance: Patient limited by fatigue Patient left: in bed;with call bell/phone within reach;with bed alarm set Nurse Communication: Mobility status PT Visit Diagnosis: Muscle weakness (generalized) (M62.81);Unsteadiness on feet (R26.81)    Time: 1205-1229 PT Time Calculation (min) (ACUTE ONLY): 24 min   Charges:   PT Evaluation $PT Eval Moderate Complexity: 1 Mod PT Treatments $Therapeutic Activity: 8-22 mins        Lou Miner, DPT  Acute Rehabilitation Services  Pager: [phone] Office: [phone]   Rudean Hitt 06/27/2019, 5:38 PM

## 2019-06-27 NOTE — Interval H&P Note (Signed)
History and Physical Interval Note:  06/27/2019 7:37 AM  Samantha Keller  has presented today for surgery, with the diagnosis of Heart failure.  The various methods of treatment have been discussed with the patient and family. After consideration of risks, benefits and other options for treatment, the patient has consented to  Procedure(s): RIGHT/LEFT HEART CATH AND CORONARY ANGIOGRAPHY (N/A) as a surgical intervention.  The patient's history has been reviewed, patient examined, no change in status, stable for surgery.  I have reviewed the patient's chart and labs.  Questions were answered to the patient's satisfaction.   Cath Lab Visit (complete for each Cath Lab visit)  Clinical Evaluation Leading to the Procedure:   ACS: No.  Non-ACS:    Anginal Classification: CCS I  Anti-ischemic medical therapy: Minimal Therapy (1 class of medications)  Non-Invasive Test Results: No non-invasive testing performed  Prior CABG: No previous CABG        Samantha Keller [facility] 06/27/2019 7:38 AM

## 2019-06-27 NOTE — Progress Notes (Signed)
PT Cancellation Note  Patient Details Name: Samantha Keller MRN: [id number] DOB: 09/28/1945   Cancelled Treatment:    Reason Eval/Treat Not Completed: Other (comment) Pt just got lunch and is wanting to eat. Will follow up as schedule allows.   Lou Miner, DPT  Acute Rehabilitation Services  Pager: [phone] Office: [phone]    Rudean Hitt 06/27/2019, 11:40 AM

## 2019-06-27 NOTE — Progress Notes (Signed)
[facility]  PROGRESS NOTE  DANAI GOTTO BLT:[id number] DOB: 06-23-1945 DOA: 06/19/2019 PCP: Patient, [name] Admit date - 06/19/2019   Admitting Physician [name], MD  Outpatient Primary MD for the patient is Patient, [name]  LOS - 7 Brief Narrative   Samantha Keller is a 74 y.o. year old female with medical history significant for recent hospitalization for MSSA pneumonia complicated by bacteremia, left thigh cellulitis, bilateral hip abscesses pneumonia (CT chest showed cavitation of right upper lobe infiltrate) (status post I&D) treated with IV cefazolin  who presented on 06/19/2019 due to abnormal CT chest obtain an ID clinic for follow-up which showed large bilateral pleural effusions and complete collapse of left lower lobe with spiculated nodule right upper lobe.    Hospital course: Patient underwent IR guided thoracentesis (650 cc clear yellow fluid on (3/26,).  Cardiology was consulted for new acute systolic CHF.  Patient has been diuresing with IV Lasix which was briefly held due to soft BPs.  Patient underwent heart cath on 4/2 which showed normal coronaries and hemodynamic consistent with mild pulmonary hypertension.    Subjective  Today lying in bed.  No chest pain.  No shortness of breath  A & P    Acute systolic CHF, stable.  EF 20-25% on TTE -14 L during hospital stay.  Still Has 1+ pitting edema of lower extremities to upper thighs.  Left heart cath with normal coronary anatomy, hemodynamic findings concerning for mild pulmonary hypertension -Appreciate cardiology recommendations, will give IV 60 mg x 1 and reassess volume -Continue medical management with Toprol and losartan switch to Entresto -, closely monitor  Bilateral transudative/culture-negative pleural effusions with chronic hypoxic respiratory failure, multifactorial etiology (malnutrition/anasarca/CHF/recent MSSA pneumonia) related to above Baseline O2 requirements of 3 L prior to admission,  currently on 2L with notable improvement in respiratory status/lung exam -Wean O2 as able -Diurese as above  Hypertension, blood pressure initially bit softer with SBP's in the 110s, improved from 24 hours ago -Likely exacerbated in setting of IV diuresis from earlier in hospital stay -Closely monitor blood pressure on IV Lasix -Closely monitor BP -Parameters in place for Toprol 25 mg twice daily, continue losartan  MSSA bacteremia, pneumonia, soft tissue abscess/right hip infection, present on admission.  Has completed 6-week course of cefazolin (end date 3/31) -Appreciate ID recommendations -Patient can be removed prior to discharge -Outpatient follow up with Dr. Megan Salon on 07/21/2019 --?doxycycline  Severe/moderate malnutrition in setting of chronic illness -Appreciate nutrition recommendations, continue Ensure  Elevated TSH, normal free T4 -Euthyroid in setting of acute illness -Will need repeat TSH as outpatient.  Hyponatremia, in the setting of hypervolemia, improving.   -Closely monitor BMP, currently holding off on diuresis  Macrocytic anemia hemoglobin stable at baseline.  Has history of recent UGI bleed, no bleeding episodes here -Daily CBC -continue home folic acid -PPI  GERD, stable -Continue PPI  Peripheral neuropathy, stable-continue gabapentin  RA previously on Humira -Continue Cymbalta, gabapentin, as needed baclofen     Family Communication  : Updated daughter Lenna Sciara on patient's phone at bedside, on 4/2  Code Status : Full code  Disposition Plan  :  Patient is from rehab facility. Anticipated d/c date: 2 to 3 days. Barriers to d/c or necessity for inpatient status:   needs continued IV diuresis, continue to monitor volume status Consults  : Radiology, infectious disease  Procedures  : IR guided thoracentesis, TTE  DVT Prophylaxis  :  Lovenox   Lab  Results  Component Value Date   PLT 315 06/27/2019    Diet :  Diet Order            Diet  Heart Room service appropriate? Yes; Fluid consistency: Thin  Diet effective now               Inpatient Medications Scheduled Meds: . Chlorhexidine Gluconate Cloth  6 each Topical Daily  . cycloSPORINE  1 drop Both Eyes BID  . doxycycline  100 mg Oral BID  . DULoxetine  60 mg Oral Daily  . [START ON 06/28/2019] enoxaparin (LOVENOX) injection  40 mg Subcutaneous Q24H  . feeding supplement (ENSURE ENLIVE)  237 mL Oral BID BM  . folic acid  2 mg Oral Daily  . furosemide  60 mg Intravenous Once  . gabapentin  600 mg Oral TID  . melatonin  6 mg Oral QHS  . metoprolol succinate  25 mg Oral BID WC  . multivitamin with minerals  1 tablet Oral Daily  . pantoprazole  40 mg Oral BID  . sacubitril-valsartan  1 tablet Oral BID  . sodium chloride flush  3 mL Intravenous Q12H  . sodium chloride flush  3 mL Intravenous Q12H  . sodium chloride flush  3 mL Intravenous Q12H   Continuous Infusions: . sodium chloride    . sodium chloride     PRN Meds:.sodium chloride, sodium chloride, acetaminophen **OR** acetaminophen, baclofen, lidocaine, ondansetron (ZOFRAN) IV, polyethylene glycol, sodium chloride flush, sodium chloride flush  Antibiotics  :   Anti-infectives (From admission, onward)   Start     Dose/Rate Route Frequency Ordered Stop   06/19/19 2315  doxycycline (VIBRA-TABS) tablet 100 mg     100 mg Oral 2 times daily 06/19/19 2304     06/19/19 2315  ceFAZolin (ANCEF) IVPB 2g/100 mL premix     2 g 200 mL/hr over 30 Minutes Intravenous Every 8 hours 06/19/19 2306 06/26/19 1048       Objective   Vitals:   06/27/19 0945 06/27/19 1000 06/27/19 1015 06/27/19 1030  BP: (!) 114/55 (!) 128/53 (!) 116/53 123/71  Pulse: 82 83 82 85  Resp: (!) 26 16 12 13   Temp:      TempSrc:      SpO2: 100% 100% 100% 100%  Weight:      Height:        SpO2: 100 % O2 Flow Rate (L/min): 2 L/min  Wt Readings from Last 3 Encounters:  06/27/19 61.2 kg  06/11/19 74.9 kg     Intake/Output Summary  (Last 24 hours) at 06/27/2019 1315 Last data filed at 06/27/2019 1248 Gross per 24 hour  Intake 240 ml  Output 1500 ml  Net -1260 ml    Physical Exam:     Awake Alert, Oriented X 3, elderly female in no distress No new F.N deficits,  [place].AT, Normal respiratory effort on 2 L nasal cannula, minimal crackles at bases, breath sounds heard throughout RRR,No Gallops,Rubs or new Murmurs,  +ve B.Sounds, Abd Soft, No tenderness, No rebound, guarding or rigidity. 1+ pitting edema bilateral lower extremities to thighs   I have personally reviewed the following:   Data Reviewed:  CBC Recent Labs  Lab 06/21/19 0422 06/21/19 0422 06/22/19 0449 06/22/19 0449 06/23/19 0505 06/24/19 0548 06/25/19 0500 06/26/19 0700 06/27/19 0345  WBC 8.0   < > 8.3   < > 8.4 9.8 8.8 7.5 8.4  HGB 8.3*   < > 8.8*   < > 8.9* 9.3*  9.1* 8.4* 8.9*  HCT 27.2*   < > 28.4*   < > 28.9* 29.9* 28.7* 27.3* 28.0*  PLT 311   < > 316   < > 278 331 325 305 315  MCV 105.4*   < > 103.3*   < > 103.2* 104.5* 104.0* 102.6* 101.8*  MCH 32.2   < > 32.0   < > 31.8 32.5 33.0 31.6 32.4  MCHC 30.5   < > 31.0   < > 30.8 31.1 31.7 30.8 31.8  RDW 17.2*   < > 16.7*   < > 16.5* 16.5* 16.5* 16.2* 15.9*  LYMPHSABS 3.2  --  3.0  --  3.2 3.6 3.4  --   --   MONOABS 0.6  --  0.8  --  0.7 0.8 0.8  --   --   EOSABS 0.3  --  0.4  --  0.4 0.4 0.4  --   --   BASOSABS 0.0  --  0.1  --  0.1 0.1 0.0  --   --    < > = values in this interval not displayed.    Chemistries  Recent Labs  Lab 06/21/19 0422 06/21/19 0422 06/22/19 0449 06/22/19 0449 06/23/19 0505 06/24/19 0548 06/25/19 0500 06/26/19 0700 06/27/19 0345  NA 136   < > 136   < > 138 134* 130* 135 134*  K 3.4*   < > 3.0*   < > 3.7 3.4* 4.0 3.8 3.9  CL 99   < > 94*   < > 94* 87* 87* 92* 93*  CO2 29   < > 33*   < > 36* 38* 37* 34* 32  GLUCOSE 95   < > 98   < > 98 98 95 96 95  BUN 13   < > 16   < > 15 13 13 12 15   CREATININE 0.73   < > 0.70   < > 0.84 0.61 0.85 0.59 0.57    CALCIUM 8.1*   < > 8.0*   < > 7.9* 8.0* 8.1* 8.3* 8.4*  MG 1.6*  --  1.6*  --  1.7 1.7 1.8  --   --   AST 39  --  22  --  19 20 27   --   --   ALT <5  --  <5  --  <5 5 <5  --   --   ALKPHOS 154*  --  144*  --  132* 123 128*  --   --   BILITOT 0.9  --  0.7  --  0.8 0.8 0.8  --   --    < > = values in this interval not displayed.   ------------------------------------------------------------------------------------------------------------------ No results for input(s): CHOL, HDL, LDLCALC, TRIG, CHOLHDL, LDLDIRECT in the last 72 hours.  No results found for: HGBA1C ------------------------------------------------------------------------------------------------------------------ No results for input(s): TSH, T4TOTAL, T3FREE, THYROIDAB in the last 72 hours.  Invalid input(s): FREET3 ------------------------------------------------------------------------------------------------------------------ Recent Labs    06/26/19 1415  VITAMINB12 554  FERRITIN 373*  TIBC 231*  IRON 32    Coagulation profile No results for input(s): INR, PROTIME in the last 168 hours.  No results for input(s): DDIMER in the last 72 hours.  Cardiac Enzymes No results for input(s): CKMB, TROPONINI, MYOGLOBIN in the last 168 hours.  Invalid input(s): CK ------------------------------------------------------------------------------------------------------------------    Component Value Date/Time   BNP 1,843.9 (H) 06/19/2019 1940    Micro Results Recent Results (from the past 240 hour(s))  Respiratory Panel  by RT PCR (Flu A&B, Covid) - Nasopharyngeal Swab     Status: None   Collection Time: 06/19/19  8:24 PM   Specimen: Nasopharyngeal Swab  Result Value Ref Range Status   SARS Coronavirus 2 by RT PCR NEGATIVE NEGATIVE Final    Comment: (NOTE) SARS-CoV-2 target nucleic acids are NOT DETECTED. The SARS-CoV-2 RNA is generally detectable in upper respiratoy specimens during the acute phase of infection. The  lowest concentration of SARS-CoV-2 viral copies this assay can detect is 131 copies/mL. A negative result does not preclude SARS-Cov-2 infection and should not be used as the sole basis for treatment or other patient management decisions. A negative result may occur with  improper specimen collection/handling, submission of specimen other than nasopharyngeal swab, presence of viral mutation(s) within the areas targeted by this assay, and inadequate number of viral copies (<131 copies/mL). A negative result must be combined with clinical observations, patient history, and epidemiological information. The expected result is Negative. Fact Sheet for Patients:  PinkCheek.be Fact Sheet for Healthcare Providers:  GravelBags.it This test is not yet ap proved or cleared by the [place] FDA and  has been authorized for detection and/or diagnosis of SARS-CoV-2 by FDA under an Emergency Use Authorization (EUA). This EUA will remain  in effect (meaning this test can be used) for the duration of the COVID-19 declaration under Section 564(b)(1) of the Act, 21 U.S.C. section 360bbb-3(b)(1), unless the authorization is terminated or revoked sooner.    Influenza A by PCR NEGATIVE NEGATIVE Final   Influenza B by PCR NEGATIVE NEGATIVE Final    Comment: (NOTE) The Xpert Xpress SARS-CoV-2/FLU/RSV assay is intended as an aid in  the diagnosis of influenza from Nasopharyngeal swab specimens and  should not be used as a sole basis for treatment. Nasal washings and  aspirates are unacceptable for Xpert Xpress SARS-CoV-2/FLU/RSV  testing. Fact Sheet for Patients: PinkCheek.be Fact Sheet for Healthcare Providers: GravelBags.it This test is not yet approved or cleared by the [place] FDA and  has been authorized for detection and/or diagnosis of SARS-CoV-2 by  FDA under an Emergency  Use Authorization (EUA). This EUA will remain  in effect (meaning this test can be used) for the duration of the  Covid-19 declaration under Section 564(b)(1) of the Act, 21  U.S.C. section 360bbb-3(b)(1), unless the authorization is  terminated or revoked. Performed at [facility], [place] [address] [place], [place] [address]   Culture, blood (routine x 2)     Status: None   Collection Time: 06/19/19 11:25 PM   Specimen: BLOOD  Result Value Ref Range Status   Specimen Description BLOOD RIGHT ARM  Final   Special Requests   Final    BOTTLES DRAWN AEROBIC AND ANAEROBIC Blood Culture adequate volume   Culture   Final    NO GROWTH 5 DAYS Performed at [facility], [address], [place], [place] [address]    Report Status 06/24/2019 FINAL  Final  Culture, blood (routine x 2)     Status: None   Collection Time: 06/19/19 11:30 PM   Specimen: BLOOD  Result Value Ref Range Status   Specimen Description BLOOD LEFT HAND  Final   Special Requests   Final    BOTTLES DRAWN AEROBIC AND ANAEROBIC Blood Culture adequate volume   Culture   Final    NO GROWTH 5 DAYS Performed at [facility], [place] [address] [place] [address], [place], [place] [address]    Report Status 06/24/2019  FINAL  Final  MRSA PCR Screening     Status: None   Collection Time: 06/20/19  8:59 AM   Specimen: Nasal Mucosa; Nasopharyngeal  Result Value Ref Range Status   MRSA by PCR NEGATIVE NEGATIVE Final    Comment:        The GeneXpert MRSA Assay (FDA approved for NASAL specimens only), is one component of a comprehensive MRSA colonization surveillance program. It is not intended to diagnose MRSA infection nor to guide or monitor treatment for MRSA infections. Performed at [facility], [place] [address], [place], [place] [address]   Gram stain     Status: None   Collection Time: 06/20/19 10:56 AM   Specimen: PATH Cytology Pleural fluid  Result Value Ref Range Status   Specimen Description FLUID  Final    Special Requests NONE  Final   Gram Stain   Final    CYTOSPIN SMEAR WBC PRESENT, PREDOMINANTLY MONONUCLEAR NO ORGANISMS SEEN Performed at [facility], [place] [address], [place], [place] [address]    Report Status 06/20/2019 FINAL  Final  Culture, body fluid-bottle     Status: None   Collection Time: 06/20/19 10:58 AM   Specimen: Fluid  Result Value Ref Range Status   Specimen Description FLUID  Final   Special Requests NONE  Final   Culture   Final    NO GROWTH 5 DAYS Performed at [place] [address], [place], [place] [address]    Report Status 06/25/2019 FINAL  Final    Radiology Reports DG Chest 1 View  Result Date: 06/20/2019 CLINICAL DATA:  Prior thoracentesis. EXAM: CHEST  1 VIEW COMPARISON:  CT 02/19/2020. FINDINGS: PICC line noted with tip over cavoatrial junction. Heart size normal. Interim decrease in left-sided pleural effusion following thoracentesis. No pneumothorax. Persistent right pleural effusion. Persistent bibasilar atelectasis. Prior cervical spine fusion. IMPRESSION: 1.  Right PICC line noted with tip over cavoatrial junction. 2. Near complete resolution of left-sided pleural effusion following thoracentesis. No evidence of pneumothorax. 3. Persistent right pleural effusion. Persistent bibasilar atelectasis. Electronically Signed   By: Marcello Moores  Register   On: 06/20/2019 09:59   CT CHEST W CONTRAST  Addendum Date: 06/25/2019   ADDENDUM REPORT: 06/25/2019 08:14 ADDENDUM: Prior images which include only plain film evaluations of the chest show evidence of effusions and basilar airspace disease with similar appearance. The CT comparison for the pulmonary nodule in the right upper lobe is not available to determine whether this is diminishing in size. Would suggest retrieval of prior images and short interval follow-up in 6-8 weeks from today's study which would be approximately 3 months from the initial exam. Comparison could be made to the more remote  exams at that time. Electronically Signed   By: Zetta Bills M.D.   On: 06/25/2019 08:14   Result Date: 06/25/2019 CLINICAL DATA:  Abnormal x-ray. EXAM: CT CHEST WITH CONTRAST TECHNIQUE: Multidetector CT imaging of the chest was performed during intravenous contrast administration. CONTRAST:  12mL OMNIPAQUE IOHEXOL 300 MG/ML  SOLN COMPARISON:  None. FINDINGS: Cardiovascular: Heart size is enlarged without pericardial effusion. Aortic caliber is normal. Left vertebral arising directly from the aortic arch. Central pulmonary arteries are engorged. Main pulmonary artery measuring approximately 2.9 cm. Not well evaluated given bolus timing. Mediastinum/Nodes: No adenopathy in the mediastinum or hilar areas with no axillary lymphadenopathy. Lungs/Pleura: Large bilateral pleural effusions. Spiculated nodule in the right upper lobe measures 12 by 11 mm (image 30, series 7) there  is complete collapse of the left lower lobe and partial collapse of the lingula in the left chest. Partial collapse of the right lower lobe due to bilateral pleural effusions. Variable enhancement of collapsed lung at the lung bases also raising the question pneumonia with fluid in distal bronchi. Large airways are patent. Upper Abdomen: Incidental imaging of upper abdominal contents is unremarkable. Musculoskeletal: Diffuse body wall edema. Ruptured left breast implant with peripheral calcification. No chest wall mass. Signs of cervical spinal fusion with anterolisthesis of C7 on T1 and extensive degenerative changes at the cervicothoracic junction. No destructive bone finding. IMPRESSION: 1. Large bilateral pleural effusions with complete collapse of the left lower lobe and partial collapse of the lingula in the left chest. No current access to prior imaging which would allow for comparison. Currently in the process of attempting to retrieve prior imaging studies. 2. Variable enhancement of collapsed lung at the lung bases also raising the  question of pneumonia with fluid in distal bronchi. This raises the question of current or prior pneumonia. 3. Spiculated nodule in the right upper lobe measures 12 x 11 mm. This is suspicious for malignancy. Though by report the patient has a history of a nodule which may have been improving. 4. Anasarca with body wall edema in addition to effusions. These results will be called to the ordering clinician or representative by the Radiologist Assistant, and communication documented in the PACS or [employer]. Electronically Signed: By: Zetta Bills M.D. On: 06/19/2019 17:12   CARDIAC CATHETERIZATION  Result Date: 06/27/2019  There is moderate to severe left ventricular systolic dysfunction.  LV end diastolic pressure is mildly elevated.  Hemodynamic findings consistent with mild pulmonary hypertension.  1. Normal coronary anatomy 2. Moderate to severe LV dysfunction. EF estimated at 35% 3. Mildly elevated LV filling pressures. 4. Mild pulmonary HTN 5. Good cardiac output. Index 4.1 Plan: medical management.   DG CHEST PORT 1 VIEW  Result Date: 06/25/2019 CLINICAL DATA:  Bilateral pleural effusions with continued shortness of breath. EXAM: PORTABLE CHEST 1 VIEW COMPARISON:  06/24/2019 FINDINGS: There is a right arm PICC line with tip at the cavoatrial junction. Or normal heart size. Decrease in left pleural effusion. Unchanged small to moderate right effusion. Unchanged appearance left lower lobe atelectasis. IMPRESSION: 1. Left lower lobe atelectasis. 2. Persistent right pleural effusion with decrease in left effusion. Electronically Signed   By: Kerby Moors M.D.   On: 06/25/2019 08:47   DG CHEST PORT 1 VIEW  Result Date: 06/24/2019 CLINICAL DATA:  Shortness of breath EXAM: PORTABLE CHEST 1 VIEW COMPARISON:  Yesterday FINDINGS: Normal heart size and mediastinal contours. Right PICC with tip at the upper right atrium. Bilateral pleural effusion with borderline improvement on the right. The  right effusion appears moderate and the left effusion more small volume. Streaky density at the lung bases. No visible pneumothorax IMPRESSION: Bilateral pleural effusion that are stable to mildly decreased. No new abnormality. Electronically Signed   By: Monte Fantasia M.D.   On: 06/24/2019 08:33   DG CHEST PORT 1 VIEW  Result Date: 06/23/2019 CLINICAL DATA:  Shortness of breath, large pleural effusion. EXAM: PORTABLE CHEST 1 VIEW COMPARISON:  06/21/2019 and CT chest 06/19/2019. FINDINGS: Right PICC terminates in the low SVC or at the SVC RA junction. Trachea is midline. Heart size stable. There are bilateral pleural effusions, large on the right and moderate on the left, similar to 06/21/2019. There may be mild mid and lower lung zone interstitial prominence and  indistinctness. Right humeral head is high riding and there are degenerative changes in the right shoulder. IMPRESSION: 1. Bilateral pleural effusions, moderate on the right and small on the left, similar. 2. Suspect mild pulmonary edema with bibasilar atelectasis. Electronically Signed   By: Lorin Picket M.D.   On: 06/23/2019 07:56   DG CHEST PORT 1 VIEW  Result Date: 06/21/2019 CLINICAL DATA:  Shortness of breath and pleural effusion EXAM: PORTABLE CHEST 1 VIEW COMPARISON:  June 20, 2019 FINDINGS: Right PICC line is identified unchanged. The mediastinal contour and cardiac silhouette are stable. Small to moderate left pleural effusion is increased compared prior exam. Moderate right pleural effusion is unchanged. Scoliosis with prior postsurgical change of cervical spine are noted unchanged. IMPRESSION: Small to moderate left pleural effusion increased compared prior exam. Moderate right pleural effusion is unchanged. Electronically Signed   By: Abelardo Diesel M.D.   On: 06/21/2019 11:19   ECHOCARDIOGRAM COMPLETE  Result Date: 06/20/2019    ECHOCARDIOGRAM REPORT   Patient Name:   ALYVIAH CRANDLE Herzberg Date of Exam: 06/20/2019 Medical Rec #:   [id number]      Height:       62.0 in Accession #:    [id number]     Weight:       160.5 lb Date of Birth:  [birth year]       BSA:          1.741 m Patient Age:    68 years       BP:           112/63 mmHg Patient Gender: F              HR:           90 bpm. Exam Location:  Inpatient Procedure: 2D Echo Indications:    Dyspnea R06.00  History:        Patient has no prior history of Echocardiogram examinations.  Sonographer:    Mikki Santee RDCS (AE) Referring Phys: [id number] Georgina Quint LATIF Iv[place]e  1. Left ventricular ejection fraction, by estimation, is 20 to 25%. The left ventricle has severely decreased function. The left ventricle demonstrates global hypokinesis. Indeterminate diastolic filling due to E-A fusion.  2. Right ventricular systolic function is mildly reduced. The right ventricular size is moderately enlarged. There is moderately elevated pulmonary artery systolic pressure. The estimated right ventricular systolic pressure is 00.9 mmHg.  3. Right atrial size was mild to moderately dilated.  4. The mitral valve is grossly normal. Mild to moderate mitral valve regurgitation.  5. Tricuspid valve regurgitation is moderate.  6. The aortic valve is tricuspid. Aortic valve regurgitation is not visualized. No aortic stenosis is present.  7. The inferior vena cava is dilated in size with <50% respiratory variability, suggesting right atrial pressure of 15 mmHg. FINDINGS  Left Ventricle: Left ventricular ejection fraction, by estimation, is 20 to 25%. The left ventricle has severely decreased function. The left ventricle demonstrates global hypokinesis. The left ventricular internal cavity size was normal in size. There is no left ventricular hypertrophy. Abnormal (paradoxical) septal motion, consistent with left bundle branch block. Indeterminate diastolic filling due to E-A fusion. Right Ventricle: The right ventricular size is moderately enlarged. No increase in right ventricular wall thickness. Right  ventricular systolic function is mildly reduced. There is moderately elevated pulmonary artery systolic pressure. The tricuspid regurgitant velocity is 3.08 m/s, and with an assumed right atrial pressure of 15 mmHg, the estimated right ventricular systolic pressure is 38.1 mmHg. Left  Atrium: Left atrial size was normal in size. Right Atrium: Right atrial size was mild to moderately dilated. Pericardium: There is no evidence of pericardial effusion. Presence of pericardial fat pad. Mitral Valve: The mitral valve is grossly normal. Mild to moderate mitral valve regurgitation. Tricuspid Valve: The tricuspid valve is grossly normal. Tricuspid valve regurgitation is moderate. Aortic Valve: The aortic valve is tricuspid. Aortic valve regurgitation is not visualized. No aortic stenosis is present. Pulmonic Valve: The pulmonic valve was grossly normal. Pulmonic valve regurgitation is not visualized. No evidence of pulmonic stenosis. Aorta: The aortic root and ascending aorta are structurally normal, with no evidence of dilitation. Venous: The inferior vena cava is dilated in size with less than 50% respiratory variability, suggesting right atrial pressure of 15 mmHg. IAS/Shunts: The atrial septum is grossly normal. Additional Comments: A venous catheter is visualized in the right atrium. There is a small pleural effusion in the left lateral region.  LEFT VENTRICLE PLAX 2D LVIDd:         4.50 cm     Diastology LVIDs:         3.60 cm     LV e' lateral:   6.30 cm/s LV PW:         1.00 cm     LV E/e' lateral: 12.9 LV IVS:        0.80 cm     LV e' medial:    4.06 cm/s LVOT diam:     1.90 cm     LV E/e' medial:  20.0 LV SV:         31 LV SV Index:   18 LVOT Area:     2.84 cm  LV Volumes (MOD) LV vol d, MOD A2C: 79.9 ml LV vol d, MOD A4C: 72.4 ml LV vol s, MOD A2C: 51.7 ml LV vol s, MOD A4C: 45.7 ml LV SV MOD A2C:     28.2 ml LV SV MOD A4C:     72.4 ml LV SV MOD BP:      29.5 ml RIGHT VENTRICLE RV S prime:     8.70 cm/s TAPSE  (M-mode): 1.2 cm LEFT ATRIUM             Index       RIGHT ATRIUM           Index LA diam:        3.50 cm 2.01 cm/m  RA Area:     16.90 cm LA Vol (A2C):   36.2 ml 20.79 ml/m RA Volume:   50.10 ml  28.78 ml/m LA Vol (A4C):   41.3 ml 23.72 ml/m LA Biplane Vol: 38.8 ml 22.29 ml/m  AORTIC VALVE LVOT Vmax:   70.00 cm/s LVOT Vmean:  44.400 cm/s LVOT VTI:    0.108 m  AORTA Ao Root diam: 2.30 cm Ao Asc diam:  3.00 cm MITRAL VALVE               TRICUSPID VALVE MV Area (PHT): 3.99 cm    TR Peak grad:   37.9 mmHg MV Decel Time: 190 msec    TR Vmax:        308.00 cm/s MR Peak grad: 33.6 mmHg MR Vmax:      290.00 cm/s  SHUNTS MV E velocity: 81.00 cm/s  Systemic VTI:  0.11 m MV A velocity: 72.20 cm/s  Systemic Diam: 1.90 cm MV E/A ratio:  1.12 Eleonore Chiquito MD Electronically signed by Eleonore Chiquito MD Signature Date/Time: 06/20/2019/4:09:00 PM  Final    VAS [place] LOWER EXTREMITY VENOUS (DVT)  Result Date: 06/22/2019  Lower Venous DVTStudy Indications: Edema.  Comparison Study: no prior study Performing Technologist: Sharion Dove RVS  Examination Guidelines: A complete evaluation includes B-mode imaging, spectral Doppler, color Doppler, and power Doppler as needed of all accessible portions of each vessel. Bilateral testing is considered an integral part of a complete examination. Limited examinations for reoccurring indications may be performed as noted. The reflux portion of the exam is performed with the patient in reverse Trendelenburg.  +---------+---------------+---------+-----------+----------+--------------+ RIGHT    CompressibilityPhasicitySpontaneityPropertiesThrombus Aging +---------+---------------+---------+-----------+----------+--------------+ CFV      Full                                         pulsatile      +---------+---------------+---------+-----------+----------+--------------+ SFJ      Full                                                         +---------+---------------+---------+-----------+----------+--------------+ FV Prox  Full                                                        +---------+---------------+---------+-----------+----------+--------------+ FV Mid   Full                                                        +---------+---------------+---------+-----------+----------+--------------+ FV DistalFull                                                        +---------+---------------+---------+-----------+----------+--------------+ PFV      Full                                                        +---------+---------------+---------+-----------+----------+--------------+ POP      Full                                         pulsatile      +---------+---------------+---------+-----------+----------+--------------+ PTV      Full                                                        +---------+---------------+---------+-----------+----------+--------------+ PERO     Full                                                        +---------+---------------+---------+-----------+----------+--------------+   +---------+---------------+---------+-----------+----------+--------------+  LEFT     CompressibilityPhasicitySpontaneityPropertiesThrombus Aging +---------+---------------+---------+-----------+----------+--------------+ CFV      Full                                         pulsatile      +---------+---------------+---------+-----------+----------+--------------+ SFJ      Full                                                        +---------+---------------+---------+-----------+----------+--------------+ FV Prox  Full                                                        +---------+---------------+---------+-----------+----------+--------------+ FV Mid   Full                                                         +---------+---------------+---------+-----------+----------+--------------+ FV DistalFull                                                        +---------+---------------+---------+-----------+----------+--------------+ PFV      Full                                                        +---------+---------------+---------+-----------+----------+--------------+ POP      Full                                         pulsatile      +---------+---------------+---------+-----------+----------+--------------+ PTV      Full                                                        +---------+---------------+---------+-----------+----------+--------------+ PERO     Full                                                        +---------+---------------+---------+-----------+----------+--------------+     Summary: BILATERAL: - No evidence of deep vein thrombosis seen in the lower extremities, bilaterally.   *See table(s) above for measurements and observations. Electronically signed by Deitra Mayo MD on 06/22/2019 at 6:39:29 PM.    Final    IR THORACENTESIS ASP PLEURAL SPACE W/IMG GUIDE  Result Date: 06/20/2019 INDICATION: Large bilateral pleural effusions with complete left lower lobe collapse, partial collapse of lingula, and spiculated right upper lobe nodule. Request for diagnostic and therapeutic thoracentesis. EXAM: ULTRASOUND GUIDED LEFT THORACENTESIS MEDICATIONS: 1% lidocaine 10 mL COMPLICATIONS: None immediate. PROCEDURE: An ultrasound guided thoracentesis was thoroughly discussed with the patient and questions answered. The benefits, risks, alternatives and complications were also discussed. The patient understands and wishes to proceed with the procedure. Written consent was obtained. Ultrasound was performed to localize and mark an adequate pocket of fluid in the left chest. The area was then prepped and draped in the normal sterile fashion. 1% Lidocaine was used for  local anesthesia. Under ultrasound guidance a 6 Fr Safe-T-Centesis catheter was introduced. Thoracentesis was performed. The catheter was removed and a dressing applied. FINDINGS: A total of approximately 650 mL of clear yellow fluid was removed. Samples were sent to the laboratory as requested by the clinical team. IMPRESSION: Successful ultrasound guided left thoracentesis yielding 650 mL of pleural fluid. No pneumothorax on post-procedure chest x-ray. Read by: Gareth Eagle, PA-C Electronically Signed   By: Lucrezia Europe M.D.   On: 06/20/2019 10:11     Time Spent in minutes  30     Desiree Hane M.D on 06/27/2019 at 1:15 PM  To page go to [email or web address] - password [facility]

## 2019-06-27 NOTE — Progress Notes (Addendum)
Progress Note  Patient Name: Samantha Keller Date of Encounter: 06/27/2019  Primary Cardiologist: New  Subjective   Breathing is OK  NO CP     Inpatient Medications    Scheduled Meds: . Chlorhexidine Gluconate Cloth  6 each Topical Daily  . cycloSPORINE  1 drop Both Eyes BID  . doxycycline  100 mg Oral BID  . DULoxetine  60 mg Oral Daily  . [START ON 06/28/2019] enoxaparin (LOVENOX) injection  40 mg Subcutaneous Q24H  . feeding supplement (ENSURE ENLIVE)  237 mL Oral BID BM  . folic acid  2 mg Oral Daily  . gabapentin  600 mg Oral TID  . losartan  12.5 mg Oral Daily  . melatonin  6 mg Oral QHS  . metoprolol succinate  25 mg Oral BID WC  . multivitamin with minerals  1 tablet Oral Daily  . pantoprazole  40 mg Oral BID  . sodium chloride flush  3 mL Intravenous Q12H  . sodium chloride flush  3 mL Intravenous Q12H  . sodium chloride flush  3 mL Intravenous Q12H   Continuous Infusions: . sodium chloride    . sodium chloride    . sodium chloride     PRN Meds: sodium chloride, sodium chloride, acetaminophen **OR** acetaminophen, baclofen, lidocaine, ondansetron (ZOFRAN) IV, polyethylene glycol, sodium chloride flush, sodium chloride flush   Vital Signs    Vitals:   06/27/19 0945 06/27/19 1000 06/27/19 1015 06/27/19 1030  BP: (!) 114/55 (!) 128/53 (!) 116/53 123/71  Pulse: 82 83 82 85  Resp: (!) 26 16 12 13   Temp:      TempSrc:      SpO2: 100% 100% 100% 100%  Weight:      Height:        Intake/Output Summary (Last 24 hours) at 06/27/2019 1041 Last data filed at 06/26/2019 1708 Gross per 24 hour  Intake 222 ml  Output 600 ml  Net -378 ml   Net neg 14.4  L   Last 3 Weights 06/27/2019 06/26/2019 06/25/2019  Weight (lbs) 134 lb 14.7 oz 134 lb 0.6 oz 137 lb 2 oz  Weight (kg) 61.2 kg 60.8 kg 62.2 kg      Telemetry     SR - Personally Reviewed  ECG    No EKG- Personally Reviewed  Physical Exam   GEN: Thin 74 yo in no acute distress.   Neck: JVP is OK    Cardiac:  RRR, no murmurs, rubs, or gallops.  Respiratory: Relatively clear   GI: Soft, nontender, non-distended  MS:  Triv t bilateral LE edema; R groin  Bandage dry  No hematoma Skin wrinkled   Neuro:  Nonfocal  Psych: Normal affect   Labs    High Sensitivity Troponin:   Recent Labs  Lab 06/19/19 1935  TROPONINIHS 12      Chemistry Recent Labs  Lab 06/23/19 0505 06/23/19 0505 06/24/19 0548 06/24/19 0548 06/25/19 0500 06/26/19 0700 06/27/19 0345  NA 138   < > 134*   < > 130* 135 134*  K 3.7   < > 3.4*   < > 4.0 3.8 3.9  CL 94*   < > 87*   < > 87* 92* 93*  CO2 36*   < > 38*   < > 37* 34* 32  GLUCOSE 98   < > 98   < > 95 96 95  BUN 15   < > 13   < > 13 12 15  CREATININE 0.84   < > 0.61   < > 0.85 0.59 0.57  CALCIUM 7.9*   < > 8.0*   < > 8.1* 8.3* 8.4*  PROT 5.6*  --  5.9*  --  5.7*  --   --   ALBUMIN 1.9*  --  2.0*  --  1.9*  --   --   AST 19  --  20  --  27  --   --   ALT <5  --  5  --  <5  --   --   ALKPHOS 132*  --  123  --  128*  --   --   BILITOT 0.8  --  0.8  --  0.8  --   --   GFRNONAA >60   < > >60   < > >60 >60 >60  GFRAA >60   < > >60   < > >60 >60 >60  ANIONGAP 8   < > 9   < > 6 9 9    < > = values in this interval not displayed.     Hematology Recent Labs  Lab 06/25/19 0500 06/26/19 0700 06/27/19 0345  WBC 8.8 7.5 8.4  RBC 2.76* 2.66* 2.75*  HGB 9.1* 8.4* 8.9*  HCT 28.7* 27.3* 28.0*  MCV 104.0* 102.6* 101.8*  MCH 33.0 31.6 32.4  MCHC 31.7 30.8 31.8  RDW 16.5* 16.2* 15.9*  PLT 325 305 315    BNP No results for input(s): BNP, PROBNP in the last 168 hours.   DDimer No results for input(s): DDIMER in the last 168 hours.   Radiology    CARDIAC CATHETERIZATION  Result Date: 06/27/2019  There is moderate to severe left ventricular systolic dysfunction.  LV end diastolic pressure is mildly elevated.  Hemodynamic findings consistent with mild pulmonary hypertension.  1. Normal coronary anatomy 2. Moderate to severe LV dysfunction. EF estimated at 35%  3. Mildly elevated LV filling pressures. 4. Mild pulmonary HTN 5. Good cardiac output. Index 4.1 Plan: medical management.    Cardiac Studies   R and L heart cath    There is moderate to severe left ventricular systolic dysfunction.  LV end diastolic pressure is mildly elevated.  Hemodynamic findings consistent with mild pulmonary hypertension.   1. Normal coronary anatomy 2. Moderate to severe LV dysfunction. EF estimated at 35% 3. Mildly elevated LV filling pressures.  4. Mild pulmonary HTN 5. Good cardiac output. Index 4.1   RA mean 7   RV 43/3   PAP 47/21   PCWP 20   LVEDP 19     Plan: medical management.   Patient Profile     Samantha Keller is a 74 y.o. female with a hx of rheumatoid arthritis, recent extended admission in [place] with MSSA bacteremia,  who is being seen today for the evaluation of acute HF at the request of Dr . Alfredia Ferguson   Assessment & Plan    1. Acute systolic HF  LVEF 20 to 68%    This is new    Cath today showed normal coronary arteries  Filling presures still increased some   Would continue diuresis later today  With 60 IV lasix x1    Reassess in Am     Wil need to come up with PO plan   (ReDs vest was not completely reliable)  On Toprol  Switch to entresto   Follow BP    Will need f/u of echo in a few months  2  ID  Finished IV ABX   ID has signed off  3  GI   GI bleeding at outside hospital  Ulcer on EGD that was cauterized   No evid of bleeding  Has   3. High TSH/normal Free T4 - per primary team  Plan for f/u in near future   4  Anemia  Hgb 8.4    Macrocytic   B12 normal   Getting folate      5  Malnutrition   Alb 1.9  Needs to build on nutrition     For questions or updates, please contact [place] Please consult [email or web address] for contact info under        Signed, [name], MD  06/27/2019, 10:41 AM

## 2019-06-27 NOTE — Progress Notes (Signed)
Patient to cath lab per transport

## 2019-06-27 NOTE — Progress Notes (Signed)
OT Cancellation Note  Patient Details Name: Samantha Keller MRN: [id number] DOB: 13-Jul-1945   Cancelled Treatment:    Reason Eval/Treat Not Completed: Patient at procedure or test/ unavailable(Pt in cath lab.)  Malka So 06/27/2019, 8:12 AM  Nestor Lewandowsky, OTR/L Acute Rehabilitation Services Pager: [phone] Office: [phone]

## 2019-06-28 LAB — BASIC METABOLIC PANEL
Anion gap: 8 (ref 5–15)
BUN: 19 mg/dL (ref 8–23)
CO2: 34 mmol/L — ABNORMAL HIGH (ref 22–32)
Calcium: 8.6 mg/dL — ABNORMAL LOW (ref 8.9–10.3)
Chloride: 93 mmol/L — ABNORMAL LOW (ref 98–111)
Creatinine, Ser: 0.53 mg/dL (ref 0.44–1.00)
GFR calc Af Amer: >60 mL/min (ref >60)
GFR calc non Af Amer: >60 mL/min (ref >60)
Glucose, Bld: 100 mg/dL — ABNORMAL HIGH (ref 70–99)
Potassium: 3.8 mmol/L (ref 3.5–5.1)
Sodium: 135 mmol/L (ref 135–145)

## 2019-06-28 LAB — CBC
HCT: 27.9 % — ABNORMAL LOW (ref 36.0–46.0)
Hemoglobin: 8.7 g/dL — ABNORMAL LOW (ref 12.0–15.0)
MCH: 32 pg (ref 26.0–34.0)
MCHC: 31.2 g/dL (ref 30.0–36.0)
MCV: 102.6 fL — ABNORMAL HIGH (ref 80.0–100.0)
Platelets: 348 10*3/uL (ref 150–400)
RBC: 2.72 MIL/uL — ABNORMAL LOW (ref 3.87–5.11)
RDW: 15.9 % — ABNORMAL HIGH (ref 11.5–15.5)
WBC: 9.8 10*3/uL (ref 4.0–10.5)
nRBC: 0 % (ref 0.0–0.2)

## 2019-06-28 MED ORDER — FUROSEMIDE 40 MG PO TABS
40.0000 mg | ORAL_TABLET | Freq: Every day | ORAL | Status: DC
  Administered 2019-06-29 – 2019-07-01 (×3): 40 mg via ORAL
  Filled 2019-06-28 (×3): qty 1

## 2019-06-28 NOTE — Progress Notes (Signed)
Progress Note  Patient Name: Samantha Keller Date of Encounter: 06/28/2019  Primary Cardiologist: New  Subjective   Breathing is better. Diuresed 1.3L negative overnight. 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  Inpatient Medications    Scheduled Meds: . Chlorhexidine Gluconate Cloth  6 each Topical Daily  . cycloSPORINE  1 drop Both Eyes BID  . doxycycline  100 mg Oral BID  . DULoxetine  60 mg Oral Daily  . enoxaparin (LOVENOX) injection  40 mg Subcutaneous Q24H  . feeding supplement (ENSURE ENLIVE)  237 mL Oral BID BM  . folic acid  2 mg Oral Daily  . gabapentin  600 mg Oral TID  . melatonin  6 mg Oral QHS  . metoprolol succinate  25 mg Oral BID WC  . multivitamin with minerals  1 tablet Oral Daily  . pantoprazole  40 mg Oral BID  . sacubitril-valsartan  1 tablet Oral BID  . sodium chloride flush  3 mL Intravenous Q12H  . sodium chloride flush  3 mL Intravenous Q12H  . sodium chloride flush  3 mL Intravenous Q12H   Continuous Infusions: . sodium chloride    . sodium chloride     PRN Meds: sodium chloride, sodium chloride, acetaminophen **OR** acetaminophen, baclofen, lidocaine, ondansetron (ZOFRAN) IV, polyethylene glycol, sodium chloride flush, sodium chloride flush   Vital Signs    Vitals:   06/27/19 1030 06/27/19 2040 06/28/19 0500 06/28/19 0918  BP: 123/71 (!) 90/39 (!) 106/53 96/64  Pulse: 85 84 78 79  Resp: 13 16 12    Temp:  (!) 97.5 F (36.4 C) 97.6 F (36.4 C)   TempSrc:  Oral Oral   SpO2: 100% 100% 100%   Weight:   62.2 kg   Height:        Intake/Output Summary (Last 24 hours) at 06/28/2019 0934 Last data filed at 06/28/2019 0929 Gross per 24 hour  Intake 250 ml  Output 1600 ml  Net -1350 ml   Net neg 14.4  L   Last 3 Weights 06/28/2019 06/27/2019 06/26/2019  Weight (lbs) 137 lb 2 oz 134 lb 14.7 oz 134 lb 0.6 oz  Weight (kg) 62.2 kg 61.2 kg 60.8 kg      Telemetry    Sinus rhythm - Personally Reviewed  ECG     N/A  Physical Exam   General appearance: alert and no distress Neck: no carotid bruit, no JVD and thyroid not enlarged, symmetric, no tenderness/mass/nodules Lungs: clear to auscultation bilaterally Heart: regular rate and rhythm Abdomen: soft, non-tender; bowel sounds normal; no masses,  no organomegaly Extremities: extremities normal, atraumatic, no cyanosis or edema Pulses: 2+ and symmetric Skin: Skin color, texture, turgor normal. No rashes or lesions Neurologic: Grossly normal Psych: Pleasant   Labs    High Sensitivity Troponin:   Recent Labs  Lab 06/19/19 1935  TROPONINIHS 12      Chemistry Recent Labs  Lab 06/23/19 0505 06/23/19 0505 06/24/19 0548 06/24/19 0548 06/25/19 0500 06/25/19 0500 06/26/19 0700 06/26/19 0700 06/27/19 0345 06/27/19 0345 06/27/19 0749 06/27/19 0752 06/28/19 0500  NA 138   < > 134*   < > 130*   < > 135   < > 134*   < > 135 134* 135  K 3.7   < > 3.4*   < > 4.0   < > 3.8   < > 3.9   < > 4.1 4.3 3.8  CL 94*   < > 87*   < >  87*   < > 92*  --  93*  --   --   --  93*  CO2 36*   < > 38*   < > 37*   < > 34*  --  32  --   --   --  34*  GLUCOSE 98   < > 98   < > 95   < > 96  --  95  --   --   --  100*  BUN 15   < > 13   < > 13   < > 12  --  15  --   --   --  19  CREATININE 0.84   < > 0.61   < > 0.85   < > 0.59  --  0.57  --   --   --  0.53  CALCIUM 7.9*   < > 8.0*   < > 8.1*   < > 8.3*  --  8.4*  --   --   --  8.6*  PROT 5.6*  --  5.9*  --  5.7*  --   --   --   --   --   --   --   --   ALBUMIN 1.9*  --  2.0*  --  1.9*  --   --   --   --   --   --   --   --   AST 19  --  20  --  27  --   --   --   --   --   --   --   --   ALT <5  --  5  --  <5  --   --   --   --   --   --   --   --   ALKPHOS 132*  --  123  --  128*  --   --   --   --   --   --   --   --   BILITOT 0.8  --  0.8  --  0.8  --   --   --   --   --   --   --   --   GFRNONAA >60   < > >60   < > >60   < > >60  --  >60  --   --   --  >60  GFRAA >60   < > >60   < > >60   < > >60  --   >60  --   --   --  >60  ANIONGAP 8   < > 9   < > 6   < > 9  --  9  --   --   --  8   < > = values in this interval not displayed.     Hematology Recent Labs  Lab 06/26/19 0700 06/26/19 0700 06/27/19 0345 06/27/19 0345 06/27/19 0749 06/27/19 0752 06/28/19 0500  WBC 7.5  --  8.4  --   --   --  9.8  RBC 2.66*  --  2.75*  --   --   --  2.72*  HGB 8.4*   < > 8.9*   < > 9.9* 9.9* 8.7*  HCT 27.3*   < > 28.0*   < > 29.0* 29.0* 27.9*  MCV 102.6*  --  101.8*  --   --   --  102.6*  MCH 31.6  --  32.4  --   --   --  32.0  MCHC 30.8  --  31.8  --   --   --  31.2  RDW 16.2*  --  15.9*  --   --   --  15.9*  PLT 305  --  315  --   --   --  348   < > = values in this interval not displayed.    BNP No results for input(s): BNP, PROBNP in the last 168 hours.   DDimer No results for input(s): DDIMER in the last 168 hours.   Radiology    CARDIAC CATHETERIZATION  Result Date: 06/27/2019  There is moderate to severe left ventricular systolic dysfunction.  LV end diastolic pressure is mildly elevated.  Hemodynamic findings consistent with mild pulmonary hypertension.  1. Normal coronary anatomy 2. Moderate to severe LV dysfunction. EF estimated at 35% 3. Mildly elevated LV filling pressures. 4. Mild pulmonary HTN 5. Good cardiac output. Index 4.1 Plan: medical management.    Cardiac Studies   R and L heart cath    There is moderate to severe left ventricular systolic dysfunction.  LV end diastolic pressure is mildly elevated.  Hemodynamic findings consistent with mild pulmonary hypertension.   1. Normal coronary anatomy 2. Moderate to severe LV dysfunction. EF estimated at 35% 3. Mildly elevated LV filling pressures.  4. Mild pulmonary HTN 5. Good cardiac output. Index 4.1   RA mean 7   RV 43/3   PAP 47/21   PCWP 20   LVEDP 19     Plan: medical management.   Patient Profile     Samantha Keller is a 74 y.o. female with a hx of rheumatoid arthritis, recent extended admission  in [place] with MSSA bacteremia,  who is being seen today for the evaluation of acute HF at the request of Dr . Alfredia Ferguson  Assessment & Plan    1. Acute systolic HF  LVEF 20 to 85% - EF looks like 35% by cath. This is new. Cath today showed normal coronary arteries  Filling presures still increased some  -Switch to lasix 40 mg po daily today. - On Toprol - entresto 24/26 added.   Follow BP   - Will need f/u of echo in a few months      2  ID  Finished IV ABX   ID has signed off  3  GI   GI bleeding at outside hospital  Ulcer on EGD that was cauterized   No evid of bleeding  Has   3. High TSH/normal Free T4 - per primary team  Plan for f/u in near future   4  Anemia  Hgb 8.4    Macrocytic   B12 normal   Getting folate      5  Malnutrition   Alb 1.9  Needs to build on nutrition     For questions or updates, please contact [place] Please consult [email or web address] for contact info under   Pixie Casino, MD, FACC, [place] Director of the [facility] Diplomate of the American Board of Clinical Lipidology Attending Cardiologist  Direct Dial: [phone]  Fax: [phone]  Website:  [email or web address]  Pixie Casino, MD  06/28/2019, 9:34 AM

## 2019-06-28 NOTE — Evaluation (Signed)
Occupational Therapy Evaluation Patient Details Name: Samantha Keller MRN: [id number] DOB: [birth year] Today's Date: 06/28/2019    History of Present Illness Pt is a 74 y/o female admitted secondary to bilateral pleural effusions. Pt is s/p thoracentesis. Found to have new CHF. Pt with recent hospitalization for MSSA pneumonia complicated by bacteremia, left thigh cellulitis, bilateral hip abscesses, pneumonia. PMH includes RA and HTN.  Pt is s/p heart cath.    Clinical Impression   Pt admitted withbilateral pleural effusions . Pt currently with functional limitations due to the deficits listed below (see OT Problem List). Pt at this time reports they sold there home and was looking at community type of living. Pt completed bed mobility supine to sitting with min guard, min to moderate assist for sit to stand transfers with cue for hand placement, tactile cue on positioning once in standing as posterior tilt, ambulated side stepping to chair with RW and min assist. Pt requires max assist with Le dressing/bathing task.  Pt will benefit from skilled OT to increase their safety and independence with ADL and functional mobility for ADL to facilitate discharge to venue listed below. Pt will be followed with Occupational Therapy Services.      Follow Up Recommendations  SNF;Supervision/Assistance - 24 hour    Equipment Recommendations       Recommendations for Other Services       Precautions / Restrictions Precautions Precautions: Fall Restrictions Weight Bearing Restrictions: No      Mobility Bed Mobility Overal bed mobility: Needs Assistance Bed Mobility: Sit to Supine;Supine to Sit     Supine to sit: Min guard;HOB elevated Sit to supine: Mod assist      Transfers Overall transfer level: Needs assistance Equipment used: Rolling walker (2 wheeled) Transfers: Stand Pivot Transfers;Sit to/from Stand Sit to Stand: Mod assist Stand pivot transfers: Mod assist       General  transfer comment: min assist from elevated surface     Balance Overall balance assessment: Needs assistance Sitting-balance support: No upper extremity supported;Feet supported Sitting balance-Leahy Scale: Fair                                     ADL either performed or assessed with clinical judgement   ADL Overall ADL's : Needs assistance/impaired Eating/Feeding: Independent;Sitting   Grooming: Wash/dry hands;Wash/dry face;Set up;Sitting   Upper Body Bathing: Moderate assistance;Sitting;Cueing for safety;Cueing for sequencing   Lower Body Bathing: Maximal assistance;Cueing for safety;Cueing for sequencing;Sit to/from stand   Upper Body Dressing : Minimal assistance;Sitting   Lower Body Dressing: Maximal assistance;Sit to/from stand;Cueing for safety;Cueing for sequencing   Toilet Transfer: Moderate assistance;Stand-pivot;BSC   Toileting- Clothing Manipulation and Hygiene: Maximal assistance;Cueing for safety;Cueing for sequencing;Sit to/from stand   Tub/ Banker: Moderate assistance;Stand-pivot;Shower seat;Grab bars;Rolling walker   Functional mobility during ADLs: Moderate assistance;Cueing for safety;Cueing for sequencing;Rolling walker(short distance )       Vision Patient Visual Report: No change from baseline Vision Assessment?: No apparent visual deficits     Perception Perception Perception Tested?: No   Praxis Praxis Praxis tested?: Not tested    Pertinent Vitals/Pain Pain Assessment: Faces Faces Pain Scale: Hurts even more Pain Location: BLe Pain Descriptors / Indicators: Aching;Grimacing;Guarding Pain Intervention(s): Monitored during session;Repositioned     Hand Dominance     Extremity/Trunk Assessment Upper Extremity Assessment Upper Extremity Assessment: Generalized weakness   Lower Extremity Assessment Lower Extremity Assessment: Defer to PT  evaluation   Cervical / Trunk Assessment Cervical / Trunk Assessment:  Normal   Communication Communication Communication: No difficulties   Cognition Arousal/Alertness: Awake/alert Behavior During Therapy: WFL for tasks assessed/performed Overall Cognitive Status: Within Functional Limits for tasks assessed                                     General Comments       Exercises     Shoulder Instructions      Home Living Family/patient expects to be discharged to:: Skilled nursing facility                                 Additional Comments: pt reported they just sold there home and looking into a comunity setting      Prior Functioning/Environment Level of Independence: Needs assistance  Gait / Transfers Assistance Needed: Was working with PT at SNF on ambulation and transfers ADL's / [place] Needed: Required assist for ADL tasks from staff            OT Problem List: Decreased strength;Decreased range of motion;Decreased activity tolerance;Decreased safety awareness;Decreased knowledge of use of DME or AE;Pain;Impaired balance (sitting and/or standing)      OT Treatment/Interventions: Self-care/ADL training;Therapeutic exercise;Energy conservation;DME and/or AE instruction;Therapeutic activities;Patient/family education;Balance training    OT Goals(Current goals can be found in the care plan section) Acute Rehab OT Goals Patient Stated Goal: to get better OT Goal Formulation: With patient Time For Goal Achievement: 07/19/19 Potential to Achieve Goals: Good  OT Frequency: Min 2X/week   Barriers to D/C:            Co-evaluation              AM-PAC OT "6 Clicks" Daily Activity     Outcome Measure Help from another person eating meals?: None Help from another person taking care of personal grooming?: A Little Help from another person toileting, which includes using toliet, bedpan, or urinal?: A Lot Help from another person bathing (including washing, rinsing, drying)?: A Lot Help from  another person to put on and taking off regular upper body clothing?: A Little Help from another person to put on and taking off regular lower body clothing?: A Lot 6 Click Score: 16   End of Session Equipment Utilized During Treatment: Gait belt;Rolling walker  Activity Tolerance: Other (comment)(decrease standing balance) Patient left: in bed;with call bell/phone within reach;Other (comment)(staff made aware about requiring box for chair alarm )  OT Visit Diagnosis: Unsteadiness on feet (R26.81);Repeated falls (R29.6);Other abnormalities of gait and mobility (R26.89);Pain Pain - part of body: Leg                Time: 0630-1601 OT Time Calculation (min): 36 min Charges:  OT General Charges $OT Visit: 1 Visit OT Evaluation $OT Eval Low Complexity: 1 Low OT Treatments $Self Care/Home Management : 8-22 mins  Joeseph Amor OTR/L  Acute Rehab Services  [phone] office number [phone] pager number   Joeseph Amor 06/28/2019, 2:44 PM

## 2019-06-28 NOTE — Progress Notes (Signed)
[facility]  PROGRESS NOTE  [name]:[id number] DOB: 1946-01-17 DOA: 06/19/2019 PCP: Patient, [name] Admit date - 06/19/2019   Admitting Physician [name], MD  Outpatient Primary MD for the patient is Patient, [name]  LOS - 8 Brief Narrative   ARRYANA Keller is a 74 y.o. year old female with medical history significant for recent hospitalization for MSSA pneumonia complicated by bacteremia, left thigh cellulitis, bilateral hip abscesses pneumonia (CT chest showed cavitation of right upper lobe infiltrate) (status post I&D) treated with IV cefazolin  who presented on 06/19/2019 due to abnormal CT chest obtain an ID clinic for follow-up which showed large bilateral pleural effusions and complete collapse of left lower lobe with spiculated nodule right upper lobe.    Hospital course: Patient underwent IR guided thoracentesis (650 cc clear yellow fluid on (3/26,).  Cardiology was consulted for new acute systolic CHF.  Patient has been diuresing with IV Lasix which was briefly held due to soft BPs.  Patient underwent heart cath on 4/2 which showed normal coronaries and hemodynamic consistent with mild pulmonary hypertension.    Subjective  Today denies chest pain, no SOB, no cough  A & P    Acute systolic CHF, stable.  EF 20-25% on TTE, 35% on LHC -16 L during hospital stay, net out 1.3 L in 24 hours, only scant pitting edema of bilateral legs to thighs which is significantly improved.    Left heart cath with normal coronary anatomy, hemodynamic findings concerning for mild pulmonary hypertension -Appreciate cardiology recommendations, will transition to oral lasix ( closely monitor BP) -Continue medical management with Toprol and losartan switched to [facility] --plan for follow up TTE in a few months as outpt   Bilateral transudative/culture-negative pleural effusions with chronic hypoxic respiratory failure, multifactorial etiology  (malnutrition/anasarca/CHF/recent MSSA pneumonia)  Baseline O2 requirements of 3 L prior to admission, currently on 2L with notable improvement in respiratory status/lung exam -Wean O2 as able -Diurese as above  Hypertension,BP still runs a bit soft with SBP a bit greater than 90. No longer getting iV diuresis should expect improvement. hgb stable and no signs or symptoms of bleeding.  -Closely monitor BP -Parameters in place for Toprol 25 mg twice daily, continue entresto  MSSA bacteremia, pneumonia, soft tissue abscess/right hip infection, present on admission.  Has completed 6-week course of cefazolin (end date 3/31) -Outpatient follow up with Dr. Megan Salon on 07/21/2019 --?doxycycline  Severe/moderate malnutrition in setting of chronic illness -Appreciate nutrition recommendations, continue Ensure  Elevated TSH, normal free T4 -Euthyroid in setting of acute illness -Will need repeat TSH as outpatient.  Hyponatremia, in the setting of hypervolemia, improving.   -Closely monitor BMP, currently holding off on diuresis  Chronic Macrocytic anemia.  hemoglobin stable at baseline.  Has history of recent UGI bleed that was cauterized at OSH, no bleeding episodes here -Daily CBC -continue home folic acid -PPI  GERD, stable -Continue PPI  Peripheral neuropathy, stable-continue gabapentin  RA previously on Humira -Continue Cymbalta, gabapentin, as needed baclofen     Family Communication  : Updated daughter Lenna Sciara on patient's phone at bedside, on 4/2, will update today  Code Status : Full code  Disposition Plan  :  Patient is from rehab facility. Anticipated d/c date: 24-48 hours. Barriers to d/c or necessity for inpatient status:   Monitor volume status and BP on transition to oral lasix. If stable likely return to SNF  Consults  : Radiology, infectious disease  Procedures  :  IR guided thoracentesis, TTE  DVT Prophylaxis  :  Lovenox   Lab Results  Component Value Date    PLT 348 06/28/2019    Diet :  Diet Order            Diet Heart Room service appropriate? Yes; Fluid consistency: Thin  Diet effective now               Inpatient Medications Scheduled Meds: . Chlorhexidine Gluconate Cloth  6 each Topical Daily  . cycloSPORINE  1 drop Both Eyes BID  . doxycycline  100 mg Oral BID  . DULoxetine  60 mg Oral Daily  . enoxaparin (LOVENOX) injection  40 mg Subcutaneous Q24H  . feeding supplement (ENSURE ENLIVE)  237 mL Oral BID BM  . folic acid  2 mg Oral Daily  . furosemide  40 mg Oral Daily  . gabapentin  600 mg Oral TID  . melatonin  6 mg Oral QHS  . metoprolol succinate  25 mg Oral BID WC  . multivitamin with minerals  1 tablet Oral Daily  . pantoprazole  40 mg Oral BID  . sacubitril-valsartan  1 tablet Oral BID  . sodium chloride flush  3 mL Intravenous Q12H  . sodium chloride flush  3 mL Intravenous Q12H  . sodium chloride flush  3 mL Intravenous Q12H   Continuous Infusions: . sodium chloride    . sodium chloride     PRN Meds:.sodium chloride, sodium chloride, acetaminophen **OR** acetaminophen, baclofen, lidocaine, ondansetron (ZOFRAN) IV, polyethylene glycol, sodium chloride flush, sodium chloride flush  Antibiotics  :   Anti-infectives (From admission, onward)   Start     Dose/Rate Route Frequency Ordered Stop   06/19/19 2315  doxycycline (VIBRA-TABS) tablet 100 mg     100 mg Oral 2 times daily 06/19/19 2304     06/19/19 2315  ceFAZolin (ANCEF) IVPB 2g/100 mL premix     2 g 200 mL/hr over 30 Minutes Intravenous Every 8 hours 06/19/19 2306 06/26/19 1048       Objective   Vitals:   06/27/19 1030 06/27/19 2040 06/28/19 0500 06/28/19 0918  BP: 123/71 (!) 90/39 (!) 106/53 96/64  Pulse: 85 84 78 79  Resp: 13 16 12    Temp:  (!) 97.5 F (36.4 C) 97.6 F (36.4 C)   TempSrc:  Oral Oral   SpO2: 100% 100% 100%   Weight:   62.2 kg   Height:        SpO2: 100 % O2 Flow Rate (L/min): 2 L/min  Wt Readings from Last 3  Encounters:  06/28/19 62.2 kg  06/11/19 74.9 kg     Intake/Output Summary (Last 24 hours) at 06/28/2019 1035 Last data filed at 06/28/2019 0929 Gross per 24 hour  Intake 250 ml  Output 1600 ml  Net -1350 ml    Physical Exam:     Awake Alert, Oriented X 3, elderly female in no distress No new F.N deficits,  [place].AT, Normal respiratory effort on 2 L nasal cannula, normal breath sounds RRR,No Gallops,Rubs or new Murmurs,  +ve B.Sounds, Abd Soft, No tenderness, No rebound, guarding or rigidity. 1+ scant pitting edema bilateral lower extremities to thighs   I have personally reviewed the following:   Data Reviewed:  CBC Recent Labs  Lab 06/22/19 0449 06/22/19 0449 06/23/19 0505 06/23/19 0505 06/24/19 0548 06/24/19 0548 06/25/19 0500 06/25/19 0500 06/26/19 0700 06/27/19 0345 06/27/19 0749 06/27/19 0752 06/28/19 0500  WBC 8.3   < > 8.4   < >  9.8  --  8.8  --  7.5 8.4  --   --  9.8  HGB 8.8*   < > 8.9*   < > 9.3*   < > 9.1*   < > 8.4* 8.9* 9.9* 9.9* 8.7*  HCT 28.4*   < > 28.9*   < > 29.9*   < > 28.7*   < > 27.3* 28.0* 29.0* 29.0* 27.9*  PLT 316   < > 278   < > 331  --  325  --  305 315  --   --  348  MCV 103.3*   < > 103.2*   < > 104.5*  --  104.0*  --  102.6* 101.8*  --   --  102.6*  MCH 32.0   < > 31.8   < > 32.5  --  33.0  --  31.6 32.4  --   --  32.0  MCHC 31.0   < > 30.8   < > 31.1  --  31.7  --  30.8 31.8  --   --  31.2  RDW 16.7*   < > 16.5*   < > 16.5*  --  16.5*  --  16.2* 15.9*  --   --  15.9*  LYMPHSABS 3.0  --  3.2  --  3.6  --  3.4  --   --   --   --   --   --   MONOABS 0.8  --  0.7  --  0.8  --  0.8  --   --   --   --   --   --   EOSABS 0.4  --  0.4  --  0.4  --  0.4  --   --   --   --   --   --   BASOSABS 0.1  --  0.1  --  0.1  --  0.0  --   --   --   --   --   --    < > = values in this interval not displayed.    Chemistries  Recent Labs  Lab 06/22/19 0449 06/22/19 0449 06/23/19 0505 06/23/19 0505 06/24/19 0548 06/24/19 0548 06/25/19 0500  06/25/19 0500 06/26/19 0700 06/27/19 0345 06/27/19 0749 06/27/19 0752 06/28/19 0500  NA 136   < > 138   < > 134*   < > 130*   < > 135 134* 135 134* 135  K 3.0*   < > 3.7   < > 3.4*   < > 4.0   < > 3.8 3.9 4.1 4.3 3.8  CL 94*   < > 94*   < > 87*  --  87*  --  92* 93*  --   --  93*  CO2 33*   < > 36*   < > 38*  --  37*  --  34* 32  --   --  34*  GLUCOSE 98   < > 98   < > 98  --  95  --  96 95  --   --  100*  BUN 16   < > 15   < > 13  --  13  --  12 15  --   --  19  CREATININE 0.70   < > 0.84   < > 0.61  --  0.85  --  0.59 0.57  --   --  0.53  CALCIUM 8.0*   < >  7.9*   < > 8.0*  --  8.1*  --  8.3* 8.4*  --   --  8.6*  MG 1.6*  --  1.7  --  1.7  --  1.8  --   --   --   --   --   --   AST 22  --  19  --  20  --  27  --   --   --   --   --   --   ALT <5  --  <5  --  5  --  <5  --   --   --   --   --   --   ALKPHOS 144*  --  132*  --  123  --  128*  --   --   --   --   --   --   BILITOT 0.7  --  0.8  --  0.8  --  0.8  --   --   --   --   --   --    < > = values in this interval not displayed.   ------------------------------------------------------------------------------------------------------------------ No results for input(s): CHOL, HDL, LDLCALC, TRIG, CHOLHDL, LDLDIRECT in the last 72 hours.  No results found for: HGBA1C ------------------------------------------------------------------------------------------------------------------ No results for input(s): TSH, T4TOTAL, T3FREE, THYROIDAB in the last 72 hours.  Invalid input(s): FREET3 ------------------------------------------------------------------------------------------------------------------ Recent Labs    06/26/19 1415  VITAMINB12 554  FERRITIN 373*  TIBC 231*  IRON 32    Coagulation profile No results for input(s): INR, PROTIME in the last 168 hours.  No results for input(s): DDIMER in the last 72 hours.  Cardiac Enzymes No results for input(s): CKMB, TROPONINI, MYOGLOBIN in the last 168 hours.  Invalid  input(s): CK ------------------------------------------------------------------------------------------------------------------    Component Value Date/Time   BNP 1,843.9 (H) 06/19/2019 1940    Micro Results Recent Results (from the past 240 hour(s))  Respiratory Panel by RT PCR (Flu A&B, Covid) - Nasopharyngeal Swab     Status: None   Collection Time: 06/19/19  8:24 PM   Specimen: Nasopharyngeal Swab  Result Value Ref Range Status   SARS Coronavirus 2 by RT PCR NEGATIVE NEGATIVE Final    Comment: (NOTE) SARS-CoV-2 target nucleic acids are NOT DETECTED. The SARS-CoV-2 RNA is generally detectable in upper respiratoy specimens during the acute phase of infection. The lowest concentration of SARS-CoV-2 viral copies this assay can detect is 131 copies/mL. A negative result does not preclude SARS-Cov-2 infection and should not be used as the sole basis for treatment or other patient management decisions. A negative result may occur with  improper specimen collection/handling, submission of specimen other than nasopharyngeal swab, presence of viral mutation(s) within the areas targeted by this assay, and inadequate number of viral copies (<131 copies/mL). A negative result must be combined with clinical observations, patient history, and epidemiological information. The expected result is Negative. Fact Sheet for Patients:  PinkCheek.be Fact Sheet for Healthcare Providers:  GravelBags.it This test is not yet ap proved or cleared by the [place] FDA and  has been authorized for detection and/or diagnosis of SARS-CoV-2 by FDA under an Emergency Use Authorization (EUA). This EUA will remain  in effect (meaning this test can be used) for the duration of the COVID-19 declaration under Section 564(b)(1) of the Act, 21 U.S.C. section 360bbb-3(b)(1), unless the authorization is terminated or revoked sooner.    Influenza A by PCR  NEGATIVE NEGATIVE Final   Influenza  B by PCR NEGATIVE NEGATIVE Final    Comment: (NOTE) The Xpert Xpress SARS-CoV-2/FLU/RSV assay is intended as an aid in  the diagnosis of influenza from Nasopharyngeal swab specimens and  should not be used as a sole basis for treatment. Nasal washings and  aspirates are unacceptable for Xpert Xpress SARS-CoV-2/FLU/RSV  testing. Fact Sheet for Patients: PinkCheek.be Fact Sheet for Healthcare Providers: GravelBags.it This test is not yet approved or cleared by the [place] FDA and  has been authorized for detection and/or diagnosis of SARS-CoV-2 by  FDA under an Emergency Use Authorization (EUA). This EUA will remain  in effect (meaning this test can be used) for the duration of the  Covid-19 declaration under Section 564(b)(1) of the Act, 21  U.S.C. section 360bbb-3(b)(1), unless the authorization is  terminated or revoked. Performed at [facility], [place] [address] [place], [place] [address]   Culture, blood (routine x 2)     Status: None   Collection Time: 06/19/19 11:25 PM   Specimen: BLOOD  Result Value Ref Range Status   Specimen Description BLOOD RIGHT ARM  Final   Special Requests   Final    BOTTLES DRAWN AEROBIC AND ANAEROBIC Blood Culture adequate volume   Culture   Final    NO GROWTH 5 DAYS Performed at [facility], [address] [place], [place] [address]    Report Status 06/24/2019 FINAL  Final  Culture, blood (routine x 2)     Status: None   Collection Time: 06/19/19 11:30 PM   Specimen: BLOOD  Result Value Ref Range Status   Specimen Description BLOOD LEFT HAND  Final   Special Requests   Final    BOTTLES DRAWN AEROBIC AND ANAEROBIC Blood Culture adequate volume   Culture   Final    NO GROWTH 5 DAYS Performed at [facility], [place] [address], [place], [place] [address]    Report Status 06/24/2019 FINAL  Final  MRSA PCR Screening     Status:  None   Collection Time: 06/20/19  8:59 AM   Specimen: Nasal Mucosa; Nasopharyngeal  Result Value Ref Range Status   MRSA by PCR NEGATIVE NEGATIVE Final    Comment:        The GeneXpert MRSA Assay (FDA approved for NASAL specimens only), is one component of a comprehensive MRSA colonization surveillance program. It is not intended to diagnose MRSA infection nor to guide or monitor treatment for MRSA infections. Performed at [facility], [place] [address] [place], [place] [address]   Gram stain     Status: None   Collection Time: 06/20/19 10:56 AM   Specimen: PATH Cytology Pleural fluid  Result Value Ref Range Status   Specimen Description FLUID  Final   Special Requests NONE  Final   Gram Stain   Final    CYTOSPIN SMEAR WBC PRESENT, PREDOMINANTLY MONONUCLEAR NO ORGANISMS SEEN Performed at [facility], [place] [address] [place], [place] [address]    Report Status 06/20/2019 FINAL  Final  Culture, body fluid-bottle     Status: None   Collection Time: 06/20/19 10:58 AM   Specimen: Fluid  Result Value Ref Range Status   Specimen Description FLUID  Final   Special Requests NONE  Final   Culture   Final    NO GROWTH 5 DAYS Performed at [facility] [address], [place],  [address]    Report Status 06/25/2019 FINAL  Final  Radiology Reports DG Chest 1 View  Result Date: 06/20/2019 CLINICAL DATA:  Prior thoracentesis. EXAM: CHEST  1 VIEW COMPARISON:  CT 02/19/2020. FINDINGS: PICC line noted with tip over cavoatrial junction. Heart size normal. Interim decrease in left-sided pleural effusion following thoracentesis. No pneumothorax. Persistent right pleural effusion. Persistent bibasilar atelectasis. Prior cervical spine fusion. IMPRESSION: 1.  Right PICC line noted with tip over cavoatrial junction. 2. Near complete resolution of left-sided pleural effusion following thoracentesis. No evidence of pneumothorax. 3. Persistent right pleural effusion.  Persistent bibasilar atelectasis. Electronically Signed   By: Marcello Moores  Register   On: 06/20/2019 09:59   CT CHEST W CONTRAST  Addendum Date: 06/25/2019   ADDENDUM REPORT: 06/25/2019 08:14 ADDENDUM: Prior images which include only plain film evaluations of the chest show evidence of effusions and basilar airspace disease with similar appearance. The CT comparison for the pulmonary nodule in the right upper lobe is not available to determine whether this is diminishing in size. Would suggest retrieval of prior images and short interval follow-up in 6-8 weeks from today's study which would be approximately 3 months from the initial exam. Comparison could be made to the more remote exams at that time. Electronically Signed   By: Zetta Bills M.D.   On: 06/25/2019 08:14   Result Date: 06/25/2019 CLINICAL DATA:  Abnormal x-ray. EXAM: CT CHEST WITH CONTRAST TECHNIQUE: Multidetector CT imaging of the chest was performed during intravenous contrast administration. CONTRAST:  16mL OMNIPAQUE IOHEXOL 300 MG/ML  SOLN COMPARISON:  None. FINDINGS: Cardiovascular: Heart size is enlarged without pericardial effusion. Aortic caliber is normal. Left vertebral arising directly from the aortic arch. Central pulmonary arteries are engorged. Main pulmonary artery measuring approximately 2.9 cm. Not well evaluated given bolus timing. Mediastinum/Nodes: No adenopathy in the mediastinum or hilar areas with no axillary lymphadenopathy. Lungs/Pleura: Large bilateral pleural effusions. Spiculated nodule in the right upper lobe measures 12 by 11 mm (image 30, series 7) there is complete collapse of the left lower lobe and partial collapse of the lingula in the left chest. Partial collapse of the right lower lobe due to bilateral pleural effusions. Variable enhancement of collapsed lung at the lung bases also raising the question pneumonia with fluid in distal bronchi. Large airways are patent. Upper Abdomen: Incidental imaging of upper  abdominal contents is unremarkable. Musculoskeletal: Diffuse body wall edema. Ruptured left breast implant with peripheral calcification. No chest wall mass. Signs of cervical spinal fusion with anterolisthesis of C7 on T1 and extensive degenerative changes at the cervicothoracic junction. No destructive bone finding. IMPRESSION: 1. Large bilateral pleural effusions with complete collapse of the left lower lobe and partial collapse of the lingula in the left chest. No current access to prior imaging which would allow for comparison. Currently in the process of attempting to retrieve prior imaging studies. 2. Variable enhancement of collapsed lung at the lung bases also raising the question of pneumonia with fluid in distal bronchi. This raises the question of current or prior pneumonia. 3. Spiculated nodule in the right upper lobe measures 12 x 11 mm. This is suspicious for malignancy. Though by report the patient has a history of a nodule which may have been improving. 4. Anasarca with body wall edema in addition to effusions. These results will be called to the ordering clinician or representative by the Radiologist Assistant, and communication documented in the PACS or [employer]. Electronically Signed: By: Zetta Bills M.D. On: 06/19/2019 17:12   CARDIAC CATHETERIZATION  Result Date: 06/27/2019  There  is moderate to severe left ventricular systolic dysfunction.  LV end diastolic pressure is mildly elevated.  Hemodynamic findings consistent with mild pulmonary hypertension.  1. Normal coronary anatomy 2. Moderate to severe LV dysfunction. EF estimated at 35% 3. Mildly elevated LV filling pressures. 4. Mild pulmonary HTN 5. Good cardiac output. Index 4.1 Plan: medical management.   DG CHEST PORT 1 VIEW  Result Date: 06/25/2019 CLINICAL DATA:  Bilateral pleural effusions with continued shortness of breath. EXAM: PORTABLE CHEST 1 VIEW COMPARISON:  06/24/2019 FINDINGS: There is a right arm PICC line  with tip at the cavoatrial junction. Or normal heart size. Decrease in left pleural effusion. Unchanged small to moderate right effusion. Unchanged appearance left lower lobe atelectasis. IMPRESSION: 1. Left lower lobe atelectasis. 2. Persistent right pleural effusion with decrease in left effusion. Electronically Signed   By: Kerby Moors M.D.   On: 06/25/2019 08:47   DG CHEST PORT 1 VIEW  Result Date: 06/24/2019 CLINICAL DATA:  Shortness of breath EXAM: PORTABLE CHEST 1 VIEW COMPARISON:  Yesterday FINDINGS: Normal heart size and mediastinal contours. Right PICC with tip at the upper right atrium. Bilateral pleural effusion with borderline improvement on the right. The right effusion appears moderate and the left effusion more small volume. Streaky density at the lung bases. No visible pneumothorax IMPRESSION: Bilateral pleural effusion that are stable to mildly decreased. No new abnormality. Electronically Signed   By: Monte Fantasia M.D.   On: 06/24/2019 08:33   DG CHEST PORT 1 VIEW  Result Date: 06/23/2019 CLINICAL DATA:  Shortness of breath, large pleural effusion. EXAM: PORTABLE CHEST 1 VIEW COMPARISON:  06/21/2019 and CT chest 06/19/2019. FINDINGS: Right PICC terminates in the low SVC or at the SVC RA junction. Trachea is midline. Heart size stable. There are bilateral pleural effusions, large on the right and moderate on the left, similar to 06/21/2019. There may be mild mid and lower lung zone interstitial prominence and indistinctness. Right humeral head is high riding and there are degenerative changes in the right shoulder. IMPRESSION: 1. Bilateral pleural effusions, moderate on the right and small on the left, similar. 2. Suspect mild pulmonary edema with bibasilar atelectasis. Electronically Signed   By: Lorin Picket M.D.   On: 06/23/2019 07:56   DG CHEST PORT 1 VIEW  Result Date: 06/21/2019 CLINICAL DATA:  Shortness of breath and pleural effusion EXAM: PORTABLE CHEST 1 VIEW  COMPARISON:  June 20, 2019 FINDINGS: Right PICC line is identified unchanged. The mediastinal contour and cardiac silhouette are stable. Small to moderate left pleural effusion is increased compared prior exam. Moderate right pleural effusion is unchanged. Scoliosis with prior postsurgical change of cervical spine are noted unchanged. IMPRESSION: Small to moderate left pleural effusion increased compared prior exam. Moderate right pleural effusion is unchanged. Electronically Signed   By: Abelardo Diesel M.D.   On: 06/21/2019 11:19   ECHOCARDIOGRAM COMPLETE  Result Date: 06/20/2019    ECHOCARDIOGRAM REPORT   Patient Name:   Samantha Keller Date of Exam: 06/20/2019 Medical Rec #:  [id number]      Height:       62.0 in Accession #:    [id number]     Weight:       160.5 lb Date of Birth:  11-23-1945       BSA:          1.741 m Patient Age:    5 years       BP:  112/63 mmHg Patient Gender: F              HR:           90 bpm. Exam Location:  Inpatient Procedure: 2D Echo Indications:    Dyspnea R06.00  History:        Patient has no prior history of Echocardiogram examinations.  Sonographer:    Mikki Santee RDCS (AE) Referring Phys: [id number] Georgina Quint LATIF [place]  1. Left ventricular ejection fraction, by estimation, is 20 to 25%. The left ventricle has severely decreased function. The left ventricle demonstrates global hypokinesis. Indeterminate diastolic filling due to E-A fusion.  2. Right ventricular systolic function is mildly reduced. The right ventricular size is moderately enlarged. There is moderately elevated pulmonary artery systolic pressure. The estimated right ventricular systolic pressure is 21.3 mmHg.  3. Right atrial size was mild to moderately dilated.  4. The mitral valve is grossly normal. Mild to moderate mitral valve regurgitation.  5. Tricuspid valve regurgitation is moderate.  6. The aortic valve is tricuspid. Aortic valve regurgitation is not visualized. No aortic stenosis  is present.  7. The inferior vena cava is dilated in size with <50% respiratory variability, suggesting right atrial pressure of 15 mmHg. FINDINGS  Left Ventricle: Left ventricular ejection fraction, by estimation, is 20 to 25%. The left ventricle has severely decreased function. The left ventricle demonstrates global hypokinesis. The left ventricular internal cavity size was normal in size. There is no left ventricular hypertrophy. Abnormal (paradoxical) septal motion, consistent with left bundle branch block. Indeterminate diastolic filling due to E-A fusion. Right Ventricle: The right ventricular size is moderately enlarged. No increase in right ventricular wall thickness. Right ventricular systolic function is mildly reduced. There is moderately elevated pulmonary artery systolic pressure. The tricuspid regurgitant velocity is 3.08 m/s, and with an assumed right atrial pressure of 15 mmHg, the estimated right ventricular systolic pressure is 08.6 mmHg. Left Atrium: Left atrial size was normal in size. Right Atrium: Right atrial size was mild to moderately dilated. Pericardium: There is no evidence of pericardial effusion. Presence of pericardial fat pad. Mitral Valve: The mitral valve is grossly normal. Mild to moderate mitral valve regurgitation. Tricuspid Valve: The tricuspid valve is grossly normal. Tricuspid valve regurgitation is moderate. Aortic Valve: The aortic valve is tricuspid. Aortic valve regurgitation is not visualized. No aortic stenosis is present. Pulmonic Valve: The pulmonic valve was grossly normal. Pulmonic valve regurgitation is not visualized. No evidence of pulmonic stenosis. Aorta: The aortic root and ascending aorta are structurally normal, with no evidence of dilitation. Venous: The inferior vena cava is dilated in size with less than 50% respiratory variability, suggesting right atrial pressure of 15 mmHg. IAS/Shunts: The atrial septum is grossly normal. Additional Comments: A venous  catheter is visualized in the right atrium. There is a small pleural effusion in the left lateral region.  LEFT VENTRICLE PLAX 2D LVIDd:         4.50 cm     Diastology LVIDs:         3.60 cm     LV e' lateral:   6.30 cm/s LV PW:         1.00 cm     LV E/e' lateral: 12.9 LV IVS:        0.80 cm     LV e' medial:    4.06 cm/s LVOT diam:     1.90 cm     LV E/e' medial:  20.0 LV SV:  31 LV SV Index:   18 LVOT Area:     2.84 cm  LV Volumes (MOD) LV vol d, MOD A2C: 79.9 ml LV vol d, MOD A4C: 72.4 ml LV vol s, MOD A2C: 51.7 ml LV vol s, MOD A4C: 45.7 ml LV SV MOD A2C:     28.2 ml LV SV MOD A4C:     72.4 ml LV SV MOD BP:      29.5 ml RIGHT VENTRICLE RV S prime:     8.70 cm/s TAPSE (M-mode): 1.2 cm LEFT ATRIUM             Index       RIGHT ATRIUM           Index LA diam:        3.50 cm 2.01 cm/m  RA Area:     16.90 cm LA Vol (A2C):   36.2 ml 20.79 ml/m RA Volume:   50.10 ml  28.78 ml/m LA Vol (A4C):   41.3 ml 23.72 ml/m LA Biplane Vol: 38.8 ml 22.29 ml/m  AORTIC VALVE LVOT Vmax:   70.00 cm/s LVOT Vmean:  44.400 cm/s LVOT VTI:    0.108 m  AORTA Ao Root diam: 2.30 cm Ao Asc diam:  3.00 cm MITRAL VALVE               TRICUSPID VALVE MV Area (PHT): 3.99 cm    TR Peak grad:   37.9 mmHg MV Decel Time: 190 msec    TR Vmax:        308.00 cm/s MR Peak grad: 33.6 mmHg MR Vmax:      290.00 cm/s  SHUNTS MV E velocity: 81.00 cm/s  Systemic VTI:  0.11 m MV A velocity: 72.20 cm/s  Systemic Diam: 1.90 cm MV E/A ratio:  1.12 Eleonore Chiquito MD Electronically signed by Eleonore Chiquito MD Signature Date/Time: 06/20/2019/4:09:00 PM    Final    VAS [place] LOWER EXTREMITY VENOUS (DVT)  Result Date: 06/22/2019  Lower Venous DVTStudy Indications: Edema.  Comparison Study: no prior study Performing Technologist: Sharion Dove RVS  Examination Guidelines: A complete evaluation includes B-mode imaging, spectral Doppler, color Doppler, and power Doppler as needed of all accessible portions of each vessel. Bilateral testing is considered an  integral part of a complete examination. Limited examinations for reoccurring indications may be performed as noted. The reflux portion of the exam is performed with the patient in reverse Trendelenburg.  +---------+---------------+---------+-----------+----------+--------------+ RIGHT    CompressibilityPhasicitySpontaneityPropertiesThrombus Aging +---------+---------------+---------+-----------+----------+--------------+ CFV      Full                                         pulsatile      +---------+---------------+---------+-----------+----------+--------------+ SFJ      Full                                                        +---------+---------------+---------+-----------+----------+--------------+ FV Prox  Full                                                        +---------+---------------+---------+-----------+----------+--------------+  FV Mid   Full                                                        +---------+---------------+---------+-----------+----------+--------------+ FV DistalFull                                                        +---------+---------------+---------+-----------+----------+--------------+ PFV      Full                                                        +---------+---------------+---------+-----------+----------+--------------+ POP      Full                                         pulsatile      +---------+---------------+---------+-----------+----------+--------------+ PTV      Full                                                        +---------+---------------+---------+-----------+----------+--------------+ PERO     Full                                                        +---------+---------------+---------+-----------+----------+--------------+   +---------+---------------+---------+-----------+----------+--------------+ LEFT     CompressibilityPhasicitySpontaneityPropertiesThrombus  Aging +---------+---------------+---------+-----------+----------+--------------+ CFV      Full                                         pulsatile      +---------+---------------+---------+-----------+----------+--------------+ SFJ      Full                                                        +---------+---------------+---------+-----------+----------+--------------+ FV Prox  Full                                                        +---------+---------------+---------+-----------+----------+--------------+ FV Mid   Full                                                        +---------+---------------+---------+-----------+----------+--------------+  FV DistalFull                                                        +---------+---------------+---------+-----------+----------+--------------+ PFV      Full                                                        +---------+---------------+---------+-----------+----------+--------------+ POP      Full                                         pulsatile      +---------+---------------+---------+-----------+----------+--------------+ PTV      Full                                                        +---------+---------------+---------+-----------+----------+--------------+ PERO     Full                                                        +---------+---------------+---------+-----------+----------+--------------+     Summary: BILATERAL: - No evidence of deep vein thrombosis seen in the lower extremities, bilaterally.   *See table(s) above for measurements and observations. Electronically signed by Deitra Mayo MD on 06/22/2019 at 6:39:29 PM.    Final    IR THORACENTESIS ASP PLEURAL SPACE W/IMG GUIDE  Result Date: 06/20/2019 INDICATION: Large bilateral pleural effusions with complete left lower lobe collapse, partial collapse of lingula, and spiculated right upper lobe nodule. Request for  diagnostic and therapeutic thoracentesis. EXAM: ULTRASOUND GUIDED LEFT THORACENTESIS MEDICATIONS: 1% lidocaine 10 mL COMPLICATIONS: None immediate. PROCEDURE: An ultrasound guided thoracentesis was thoroughly discussed with the patient and questions answered. The benefits, risks, alternatives and complications were also discussed. The patient understands and wishes to proceed with the procedure. Written consent was obtained. Ultrasound was performed to localize and mark an adequate pocket of fluid in the left chest. The area was then prepped and draped in the normal sterile fashion. 1% Lidocaine was used for local anesthesia. Under ultrasound guidance a 6 Fr Safe-T-Centesis catheter was introduced. Thoracentesis was performed. The catheter was removed and a dressing applied. FINDINGS: A total of approximately 650 mL of clear yellow fluid was removed. Samples were sent to the laboratory as requested by the clinical team. IMPRESSION: Successful ultrasound guided left thoracentesis yielding 650 mL of pleural fluid. No pneumothorax on post-procedure chest x-ray. Read by: Gareth Eagle, PA-C Electronically Signed   By: Lucrezia Europe M.D.   On: 06/20/2019 10:11     Time Spent in minutes  30     Desiree Hane M.D on 06/28/2019 at 10:35 AM  To page go to [email or web address] - password [facility]

## 2019-06-29 DIAGNOSIS — Z9889 Other specified postprocedural states: Secondary | ICD-10-CM

## 2019-06-29 DIAGNOSIS — E440 Moderate protein-calorie malnutrition: Secondary | ICD-10-CM

## 2019-06-29 LAB — SARS CORONAVIRUS 2 (TAT 6-24 HRS): SARS Coronavirus 2: NEGATIVE

## 2019-06-29 NOTE — Progress Notes (Signed)
Progress Note  Patient Name: Samantha Keller Date of Encounter: 06/29/2019  Primary Cardiologist: New  Subjective   Breathing is better. Mildly net negative to even overnight. BP has been in the low normal range, but she is asymptomatic.  Inpatient Medications    Scheduled Meds: . Chlorhexidine Gluconate Cloth  6 each Topical Daily  . cycloSPORINE  1 drop Both Eyes BID  . doxycycline  100 mg Oral BID  . DULoxetine  60 mg Oral Daily  . enoxaparin (LOVENOX) injection  40 mg Subcutaneous Q24H  . feeding supplement (ENSURE ENLIVE)  237 mL Oral BID BM  . folic acid  2 mg Oral Daily  . furosemide  40 mg Oral Daily  . gabapentin  600 mg Oral TID  . melatonin  6 mg Oral QHS  . metoprolol succinate  25 mg Oral BID WC  . multivitamin with minerals  1 tablet Oral Daily  . pantoprazole  40 mg Oral BID  . sacubitril-valsartan  1 tablet Oral BID  . sodium chloride flush  3 mL Intravenous Q12H  . sodium chloride flush  3 mL Intravenous Q12H  . sodium chloride flush  3 mL Intravenous Q12H   Continuous Infusions: . sodium chloride    . sodium chloride     PRN Meds: sodium chloride, sodium chloride, acetaminophen **OR** acetaminophen, baclofen, lidocaine, ondansetron (ZOFRAN) IV, polyethylene glycol, sodium chloride flush, sodium chloride flush   Vital Signs    Vitals:   06/28/19 1818 06/28/19 2017 06/29/19 0533 06/29/19 0900  BP: (!) 106/50 (!) 104/46 (!) 124/56 (!) 105/48  Pulse: 81 80 83 84  Resp: 12 19 11 15   Temp:  98.5 F (36.9 C) 97.8 F (36.6 C) 98.2 F (36.8 C)  TempSrc:  Oral Oral Oral  SpO2:  97% 97% 95%  Weight:   59.5 kg   Height:        Intake/Output Summary (Last 24 hours) at 06/29/2019 1101 Last data filed at 06/28/2019 1313 Gross per 24 hour  Intake 200 ml  Output 250 ml  Net -50 ml   Net neg 14.4  L   Last 3 Weights 06/29/2019 06/28/2019 06/27/2019  Weight (lbs) 131 lb 2.8 oz 137 lb 2 oz 134 lb 14.7 oz  Weight (kg) 59.5 kg 62.2 kg 61.2 kg      Telemetry    Sinus rhythm - Personally Reviewed  ECG    N/A  Physical Exam   General appearance: alert and no distress Neck: no carotid bruit, no JVD and thyroid not enlarged, symmetric, no tenderness/mass/nodules Lungs: clear to auscultation bilaterally Heart: regular rate and rhythm Abdomen: soft, non-tender; bowel sounds normal; no masses,  no organomegaly Extremities: extremities normal, atraumatic, no cyanosis or edema Pulses: 2+ and symmetric Skin: Skin color, texture, turgor normal. No rashes or lesions Neurologic: Grossly normal Psych: Pleasant   Labs    High Sensitivity Troponin:   Recent Labs  Lab 06/19/19 1935  TROPONINIHS 12      Chemistry Recent Labs  Lab 06/23/19 0505 06/23/19 0505 06/24/19 0548 06/24/19 0548 06/25/19 0500 06/25/19 0500 06/26/19 0700 06/26/19 0700 06/27/19 0345 06/27/19 0345 06/27/19 0749 06/27/19 0752 06/28/19 0500  NA 138   < > 134*   < > 130*   < > 135   < > 134*   < > 135 134* 135  K 3.7   < > 3.4*   < > 4.0   < > 3.8   < > 3.9   < >  4.1 4.3 3.8  CL 94*   < > 87*   < > 87*   < > 92*  --  93*  --   --   --  93*  CO2 36*   < > 38*   < > 37*   < > 34*  --  32  --   --   --  34*  GLUCOSE 98   < > 98   < > 95   < > 96  --  95  --   --   --  100*  BUN 15   < > 13   < > 13   < > 12  --  15  --   --   --  19  CREATININE 0.84   < > 0.61   < > 0.85   < > 0.59  --  0.57  --   --   --  0.53  CALCIUM 7.9*   < > 8.0*   < > 8.1*   < > 8.3*  --  8.4*  --   --   --  8.6*  PROT 5.6*  --  5.9*  --  5.7*  --   --   --   --   --   --   --   --   ALBUMIN 1.9*  --  2.0*  --  1.9*  --   --   --   --   --   --   --   --   AST 19  --  20  --  27  --   --   --   --   --   --   --   --   ALT <5  --  5  --  <5  --   --   --   --   --   --   --   --   ALKPHOS 132*  --  123  --  128*  --   --   --   --   --   --   --   --   BILITOT 0.8  --  0.8  --  0.8  --   --   --   --   --   --   --   --   GFRNONAA >60   < > >60   < > >60   < > >60  --  >60  --   --   --  >60    GFRAA >60   < > >60   < > >60   < > >60  --  >60  --   --   --  >60  ANIONGAP 8   < > 9   < > 6   < > 9  --  9  --   --   --  8   < > = values in this interval not displayed.     Hematology Recent Labs  Lab 06/26/19 0700 06/26/19 0700 06/27/19 0345 06/27/19 0345 06/27/19 0749 06/27/19 0752 06/28/19 0500  WBC 7.5  --  8.4  --   --   --  9.8  RBC 2.66*  --  2.75*  --   --   --  2.72*  HGB 8.4*   < > 8.9*   < > 9.9* 9.9* 8.7*  HCT 27.3*   < > 28.0*   < > 29.0* 29.0*  27.9*  MCV 102.6*  --  101.8*  --   --   --  102.6*  MCH 31.6  --  32.4  --   --   --  32.0  MCHC 30.8  --  31.8  --   --   --  31.2  RDW 16.2*  --  15.9*  --   --   --  15.9*  PLT 305  --  315  --   --   --  348   < > = values in this interval not displayed.    BNP No results for input(s): BNP, PROBNP in the last 168 hours.   DDimer No results for input(s): DDIMER in the last 168 hours.   Radiology    No results found.  Cardiac Studies   R and L heart cath    There is moderate to severe left ventricular systolic dysfunction.  LV end diastolic pressure is mildly elevated.  Hemodynamic findings consistent with mild pulmonary hypertension.   1. Normal coronary anatomy 2. Moderate to severe LV dysfunction. EF estimated at 35% 3. Mildly elevated LV filling pressures.  4. Mild pulmonary HTN 5. Good cardiac output. Index 4.1   RA mean 7   RV 43/3   PAP 47/21   PCWP 20   LVEDP 19     Plan: medical management.   Patient Profile     Samantha Keller is a 74 y.o. female with a hx of rheumatoid arthritis, recent extended admission in [place] with MSSA bacteremia,  who is being seen today for the evaluation of acute HF at the request of Dr . Alfredia Ferguson  Assessment & Plan    1. Acute systolic HF  LVEF 20 to 37% - EF looks like 35% by cath. This is new. Cath today showed normal coronary arteries  Filling presures still increased some  -About net even with fluids - has diuresed 16L negative, continue  current lasix dose - On Toprol - entresto 24/26 added.   BP remains low normal, but acceptable given low EF - Will need f/u of echo in a few months   - Discussed possibility of LifeVest with family - she meets criteria, however, I think the likelihood of ventricular arrhythmia is low - could wear it however until repeat echo in 3 months. Family and patient will consider it - not likely to fit today, probably tomorrow. - Stable from a cardiac standpoint to transfer to nursing facility - family is anxious about her doing that, saying they don't believe that she will get the attention she needs    2  ID  Finished IV ABX   ID has signed off  3  GI   GI bleeding at outside hospital  Ulcer on EGD that was cauterized   No evid of bleeding  Has   3. High TSH/normal Free T4 - per primary team  Plan for f/u in near future   4  Anemia  Hgb 8.4    Macrocytic   B12 normal   Getting folate      5  Malnutrition   Alb 1.9  Needs to build on nutrition     For questions or updates, please contact [place] Please consult [email or web address] for contact info under   Pixie Casino, MD, FACC, [place] Director of the [facility] Diplomate of the American Board of Clinical Lipidology Attending Cardiologist  Direct Dial: [phone]  Fax: [phone]  Website:  [email or web address]  Pixie Casino, MD  06/29/2019, 11:01 AM

## 2019-06-29 NOTE — Progress Notes (Signed)
[facility]  PROGRESS NOTE  Samantha Keller KVQ:[id number] DOB: 1945-10-01 DOA: 06/19/2019 PCP: Patient, [name] Admit date - 06/19/2019   Admitting Physician Samantha Bulls, MD  Outpatient Primary MD for the patient is Patient, [name]  LOS - 9 Brief Narrative   Samantha Keller is a 74 y.o. year old female with medical history significant for recent hospitalization (discharged 05/14/19) in [place], [place] for MSSA cavitary pneumonia complicated by bacteremia for which she required mechanical ventilation for about 6 day presumed source was, left thigh cellulitis and bilateral hip abscesses (s/p I&D) on IV cefazolin, chronic hypoxic respiratory failure on 3 L with CT chest showin cavitation of right upper lobe infiltrat, and RA previously on anti-TNF therapy  who presented on 06/19/2019 from [facility] to abnormal CT chest obtain an ID clinic for follow-up which showed large bilateral pleural effusions and complete collapse of left lower lobe with spiculated nodule right upper lobe.    Hospital course: Patient underwent IR guided thoracentesis (650 cc clear yellow fluid on (3/26,).  Cardiology was consulted for new acute systolic CHF.  Patient has been diuresing with IV Lasix which was briefly held due to soft BPs.  Patient underwent heart cath on 4/2 which showed normal coronaries and hemodynamic consistent with mild pulmonary hypertension.    Subjective  Today denies chest pain, no SOB, no cough  A & P    Acute systolic CHF, stable.  EF 20-25% on TTE, 35% on LHC -16 L during hospital stay, net out 1.3 L in 24 hours, only scant pitting edema of bilateral legs to thighs which is significantly improved.    Left heart cath with normal coronary anatomy, hemodynamic findings concerning for mild pulmonary hypertension. Meets criteria for LifeVest given depressed EF -Appreciate cardiology recommendations, likely needs LifeVest-- family and patient considering --BP now allowing  oral lasix ( closely monitor BP) -Continue medical management with Toprol and Entresto --plan for follow up TTE in a few months as outpt   Bilateral transudative/culture-negative pleural effusions with chronic hypoxic respiratory failure, multifactorial etiology (malnutrition/anasarca/CHF/recent MSSA pneumonia), resolved Baseline O2 requirements of 3 L prior to admission, now off O2 after diuresis. No malignant cells on pleural fluid s/p L thoracentesis  -monitor -Diurese as above -walking O2 test  Hypertension,BP still runs a bit soft but SBP >100.  No longer getting iV diuresis should expect improvement. hgb stable and no signs or symptoms of bleeding.  -Closely monitor BP -Parameters in place for Toprol 25 mg twice daily, continue entresto  MSSA bacteremia, pneumonia, soft tissue abscess/right hip infection, present on admission.  Has completed 6-week course of cefazolin (end date 3/31) -Outpatient follow up with Dr. Megan Keller on 07/21/2019 --will dc doxycycline  Severe/moderate malnutrition in setting of chronic illness -Appreciate nutrition recommendations, continue Ensure  Elevated TSH, normal free T4 -Euthyroid in setting of acute illness -Will need repeat TSH as outpatient.  Hyponatremia, in the setting of hypervolemia, improving.   -Closely monitor BMP, currently holding off on diuresis  Chronic Macrocytic anemia.  hemoglobin stable at baseline.  Has history of recent UGI bleed that was cauterized at OSH, no bleeding episodes here -Daily CBC -continue home folic acid -PPI  GERD, stable -Continue PPI  Peripheral neuropathy, stable -continue gabapentin  RA previously on Humira -Continue Cymbalta, gabapentin, as needed baclofen     Family Communication  : Updated daughter Samantha Keller on patient's phone at bedside, on 4/4  Code Status : Full code  Disposition Plan  :  Patient is from rehab facility. Anticipated d/c date: 24-48 hours. Barriers to d/c or necessity for  inpatient status:   Monitor volume status and BP on transition to oral lasix. If stable likely return to SNF per PT recs Consults  : Radiology, infectious disease, cardiology  Procedures  : IR guided thoracentesis, TTE  DVT Prophylaxis  :  Lovenox   Lab Results  Component Value Date   PLT 348 06/28/2019    Diet :  Diet Order            Diet Heart Room service appropriate? Yes with Assist; Fluid consistency: Thin  Diet effective now               Inpatient Medications Scheduled Meds: . Chlorhexidine Gluconate Cloth  6 each Topical Daily  . cycloSPORINE  1 drop Both Eyes BID  . doxycycline  100 mg Oral BID  . DULoxetine  60 mg Oral Daily  . enoxaparin (LOVENOX) injection  40 mg Subcutaneous Q24H  . feeding supplement (ENSURE ENLIVE)  237 mL Oral BID BM  . folic acid  2 mg Oral Daily  . furosemide  40 mg Oral Daily  . gabapentin  600 mg Oral TID  . melatonin  6 mg Oral QHS  . metoprolol succinate  25 mg Oral BID WC  . multivitamin with minerals  1 tablet Oral Daily  . pantoprazole  40 mg Oral BID  . sacubitril-valsartan  1 tablet Oral BID  . sodium chloride flush  3 mL Intravenous Q12H  . sodium chloride flush  3 mL Intravenous Q12H  . sodium chloride flush  3 mL Intravenous Q12H   Continuous Infusions: . sodium chloride    . sodium chloride     PRN Meds:.sodium chloride, sodium chloride, acetaminophen **OR** acetaminophen, baclofen, lidocaine, ondansetron (ZOFRAN) IV, polyethylene glycol, sodium chloride flush, sodium chloride flush  Antibiotics  :   Anti-infectives (From admission, onward)   Start     Dose/Rate Route Frequency Ordered Stop   06/19/19 2315  doxycycline (VIBRA-TABS) tablet 100 mg     100 mg Oral 2 times daily 06/19/19 2304     06/19/19 2315  ceFAZolin (ANCEF) IVPB 2g/100 mL premix     2 g 200 mL/hr over 30 Minutes Intravenous Every 8 hours 06/19/19 2306 06/26/19 1048       Objective   Vitals:   06/28/19 2017 06/29/19 0533 06/29/19 0900  06/29/19 1221  BP: (!) 104/46 (!) 124/56 (!) 105/48 104/65  Pulse: 80 83 84 (!) 104  Resp: 19 11 15 15   Temp: 98.5 F (36.9 C) 97.8 F (36.6 C) 98.2 F (36.8 C) 98.2 F (36.8 C)  TempSrc: Oral Oral Oral Oral  SpO2: 97% 97% 95% 97%  Weight:  59.5 kg    Height:        SpO2: 97 % O2 Flow Rate (L/min): 2 L/min  Wt Readings from Last 3 Encounters:  06/29/19 59.5 kg  06/11/19 74.9 kg     Intake/Output Summary (Last 24 hours) at 06/29/2019 1328 Last data filed at 06/29/2019 1221 Gross per 24 hour  Intake 440 ml  Output --  Net 440 ml    Physical Exam:  Awake Alert, Oriented X 3, elderly female in no distress No new F.N deficits,  [place].AT, Normal respiratory effort on room air, normal breath sounds RRR,No Gallops,Rubs or new Murmurs,  +ve B.Sounds, Abd Soft, No tenderness, No rebound, guarding or rigidity. No edema   I have personally reviewed the following:  Data Reviewed:  CBC Recent Labs  Lab 06/23/19 0505 06/23/19 0505 06/24/19 0548 06/24/19 0548 06/25/19 0500 06/25/19 0500 06/26/19 0700 06/27/19 0345 06/27/19 0749 06/27/19 0752 06/28/19 0500  WBC 8.4   < > 9.8  --  8.8  --  7.5 8.4  --   --  9.8  HGB 8.9*   < > 9.3*   < > 9.1*   < > 8.4* 8.9* 9.9* 9.9* 8.7*  HCT 28.9*   < > 29.9*   < > 28.7*   < > 27.3* 28.0* 29.0* 29.0* 27.9*  PLT 278   < > 331  --  325  --  305 315  --   --  348  MCV 103.2*   < > 104.5*  --  104.0*  --  102.6* 101.8*  --   --  102.6*  MCH 31.8   < > 32.5  --  33.0  --  31.6 32.4  --   --  32.0  MCHC 30.8   < > 31.1  --  31.7  --  30.8 31.8  --   --  31.2  RDW 16.5*   < > 16.5*  --  16.5*  --  16.2* 15.9*  --   --  15.9*  LYMPHSABS 3.2  --  3.6  --  3.4  --   --   --   --   --   --   MONOABS 0.7  --  0.8  --  0.8  --   --   --   --   --   --   EOSABS 0.4  --  0.4  --  0.4  --   --   --   --   --   --   BASOSABS 0.1  --  0.1  --  0.0  --   --   --   --   --   --    < > = values in this interval not displayed.    Chemistries  Recent  Labs  Lab 06/23/19 0505 06/23/19 0505 06/24/19 0548 06/24/19 0548 06/25/19 0500 06/25/19 0500 06/26/19 0700 06/27/19 0345 06/27/19 0749 06/27/19 0752 06/28/19 0500  NA 138   < > 134*   < > 130*   < > 135 134* 135 134* 135  K 3.7   < > 3.4*   < > 4.0   < > 3.8 3.9 4.1 4.3 3.8  CL 94*   < > 87*  --  87*  --  92* 93*  --   --  93*  CO2 36*   < > 38*  --  37*  --  34* 32  --   --  34*  GLUCOSE 98   < > 98  --  95  --  96 95  --   --  100*  BUN 15   < > 13  --  13  --  12 15  --   --  19  CREATININE 0.84   < > 0.61  --  0.85  --  0.59 0.57  --   --  0.53  CALCIUM 7.9*   < > 8.0*  --  8.1*  --  8.3* 8.4*  --   --  8.6*  MG 1.7  --  1.7  --  1.8  --   --   --   --   --   --   AST 19  --  20  --  27  --   --   --   --   --   --   ALT <5  --  5  --  <5  --   --   --   --   --   --   ALKPHOS 132*  --  123  --  128*  --   --   --   --   --   --   BILITOT 0.8  --  0.8  --  0.8  --   --   --   --   --   --    < > = values in this interval not displayed.   ------------------------------------------------------------------------------------------------------------------ No results for input(s): CHOL, HDL, LDLCALC, TRIG, CHOLHDL, LDLDIRECT in the last 72 hours.  No results found for: HGBA1C ------------------------------------------------------------------------------------------------------------------ No results for input(s): TSH, T4TOTAL, T3FREE, THYROIDAB in the last 72 hours.  Invalid input(s): FREET3 ------------------------------------------------------------------------------------------------------------------ Recent Labs    06/26/19 1415  VITAMINB12 554  FERRITIN 373*  TIBC 231*  IRON 32    Coagulation profile No results for input(s): INR, PROTIME in the last 168 hours.  No results for input(s): DDIMER in the last 72 hours.  Cardiac Enzymes No results for input(s): CKMB, TROPONINI, MYOGLOBIN in the last 168 hours.  Invalid input(s):  CK ------------------------------------------------------------------------------------------------------------------    Component Value Date/Time   BNP 1,843.9 (H) 06/19/2019 1940    Micro Results Recent Results (from the past 240 hour(s))  Respiratory Panel by RT PCR (Flu A&B, Covid) - Nasopharyngeal Swab     Status: None   Collection Time: 06/19/19  8:24 PM   Specimen: Nasopharyngeal Swab  Result Value Ref Range Status   SARS Coronavirus 2 by RT PCR NEGATIVE NEGATIVE Final    Comment: (NOTE) SARS-CoV-2 target nucleic acids are NOT DETECTED. The SARS-CoV-2 RNA is generally detectable in upper respiratoy specimens during the acute phase of infection. The lowest concentration of SARS-CoV-2 viral copies this assay can detect is 131 copies/mL. A negative result does not preclude SARS-Cov-2 infection and should not be used as the sole basis for treatment or other patient management decisions. A negative result may occur with  improper specimen collection/handling, submission of specimen other than nasopharyngeal swab, presence of viral mutation(s) within the areas targeted by this assay, and inadequate number of viral copies (<131 copies/mL). A negative result must be combined with clinical observations, patient history, and epidemiological information. The expected result is Negative. Fact Sheet for Patients:  PinkCheek.be Fact Sheet for Healthcare Providers:  GravelBags.it This test is not yet ap proved or cleared by the [place] FDA and  has been authorized for detection and/or diagnosis of SARS-CoV-2 by FDA under an Emergency Use Authorization (EUA). This EUA will remain  in effect (meaning this test can be used) for the duration of the COVID-19 declaration under Section 564(b)(1) of the Act, 21 U.S.C. section 360bbb-3(b)(1), unless the authorization is terminated or revoked sooner.    Influenza A by PCR NEGATIVE  NEGATIVE Final   Influenza B by PCR NEGATIVE NEGATIVE Final    Comment: (NOTE) The Xpert Xpress SARS-CoV-2/FLU/RSV assay is intended as an aid in  the diagnosis of influenza from Nasopharyngeal swab specimens and  should not be used as a sole basis for treatment. Nasal washings and  aspirates are unacceptable for Xpert Xpress SARS-CoV-2/FLU/RSV  testing. Fact Sheet for Patients: PinkCheek.be Fact Sheet for Healthcare Providers: GravelBags.it This test is not yet approved or cleared by the  [place] Architectural technologist and  has been authorized for detection and/or diagnosis of SARS-CoV-2 by  FDA under an Print production planner (EUA). This EUA will remain  in effect (meaning this test can be used) for the duration of the  Covid-19 declaration under Section 564(b)(1) of the Act, 21  U.S.C. section 360bbb-3(b)(1), unless the authorization is  terminated or revoked. Performed at [facility], [place] [address] [place], [place] [address]   Culture, blood (routine x 2)     Status: None   Collection Time: 06/19/19 11:25 PM   Specimen: BLOOD  Result Value Ref Range Status   Specimen Description BLOOD RIGHT ARM  Final   Special Requests   Final    BOTTLES DRAWN AEROBIC AND ANAEROBIC Blood Culture adequate volume   Culture   Final    NO GROWTH 5 DAYS Performed at [facility], [address], [place], [place] [address]    Report Status 06/24/2019 FINAL  Final  Culture, blood (routine x 2)     Status: None   Collection Time: 06/19/19 11:30 PM   Specimen: BLOOD  Result Value Ref Range Status   Specimen Description BLOOD LEFT HAND  Final   Special Requests   Final    BOTTLES DRAWN AEROBIC AND ANAEROBIC Blood Culture adequate volume   Culture   Final    NO GROWTH 5 DAYS Performed at [facility], [place] [address] [place], [place] [address]    Report Status 06/24/2019 FINAL  Final  MRSA PCR Screening     Status: None    Collection Time: 06/20/19  8:59 AM   Specimen: Nasal Mucosa; Nasopharyngeal  Result Value Ref Range Status   MRSA by PCR NEGATIVE NEGATIVE Final    Comment:        The GeneXpert MRSA Assay (FDA approved for NASAL specimens only), is one component of a comprehensive MRSA colonization surveillance program. It is not intended to diagnose MRSA infection nor to guide or monitor treatment for MRSA infections. Performed at [facility], [place] [address], [place], [place] [address]   Gram stain     Status: None   Collection Time: 06/20/19 10:56 AM   Specimen: PATH Cytology Pleural fluid  Result Value Ref Range Status   Specimen Description FLUID  Final   Special Requests NONE  Final   Gram Stain   Final    CYTOSPIN SMEAR WBC PRESENT, PREDOMINANTLY MONONUCLEAR NO ORGANISMS SEEN Performed at [facility], [place] [address], [place], [place] [address]    Report Status 06/20/2019 FINAL  Final  Culture, body fluid-bottle     Status: None   Collection Time: 06/20/19 10:58 AM   Specimen: Fluid  Result Value Ref Range Status   Specimen Description FLUID  Final   Special Requests NONE  Final   Culture   Final    NO GROWTH 5 DAYS Performed at [facility] [address] [place], [place] [address]    Report Status 06/25/2019 FINAL  Final    Radiology Reports DG Chest 1 View  Result Date: 06/20/2019 CLINICAL DATA:  Prior thoracentesis. EXAM: CHEST  1 VIEW COMPARISON:  CT 02/19/2020. FINDINGS: PICC line noted with tip over cavoatrial junction. Heart size normal. Interim decrease in left-sided pleural effusion following thoracentesis. No pneumothorax. Persistent right pleural effusion. Persistent bibasilar atelectasis. Prior cervical spine fusion. IMPRESSION: 1.  Right PICC line noted with tip over cavoatrial junction. 2. Near complete resolution of left-sided  pleural effusion following thoracentesis. No evidence of pneumothorax. 3. Persistent right pleural effusion. Persistent  bibasilar atelectasis. Electronically Signed   By: Marcello Moores  Register   On: 06/20/2019 09:59   CT CHEST W CONTRAST  Addendum Date: 06/25/2019   ADDENDUM REPORT: 06/25/2019 08:14 ADDENDUM: Prior images which include only plain film evaluations of the chest show evidence of effusions and basilar airspace disease with similar appearance. The CT comparison for the pulmonary nodule in the right upper lobe is not available to determine whether this is diminishing in size. Would suggest retrieval of prior images and short interval follow-up in 6-8 weeks from today's study which would be approximately 3 months from the initial exam. Comparison could be made to the more remote exams at that time. Electronically Signed   By: Zetta Bills M.D.   On: 06/25/2019 08:14   Result Date: 06/25/2019 CLINICAL DATA:  Abnormal x-ray. EXAM: CT CHEST WITH CONTRAST TECHNIQUE: Multidetector CT imaging of the chest was performed during intravenous contrast administration. CONTRAST:  73mL OMNIPAQUE IOHEXOL 300 MG/ML  SOLN COMPARISON:  None. FINDINGS: Cardiovascular: Heart size is enlarged without pericardial effusion. Aortic caliber is normal. Left vertebral arising directly from the aortic arch. Central pulmonary arteries are engorged. Main pulmonary artery measuring approximately 2.9 cm. Not well evaluated given bolus timing. Mediastinum/Nodes: No adenopathy in the mediastinum or hilar areas with no axillary lymphadenopathy. Lungs/Pleura: Large bilateral pleural effusions. Spiculated nodule in the right upper lobe measures 12 by 11 mm (image 30, series 7) there is complete collapse of the left lower lobe and partial collapse of the lingula in the left chest. Partial collapse of the right lower lobe due to bilateral pleural effusions. Variable enhancement of collapsed lung at the lung bases also raising the question pneumonia with fluid in distal bronchi. Large airways are patent. Upper Abdomen: Incidental imaging of upper abdominal  contents is unremarkable. Musculoskeletal: Diffuse body wall edema. Ruptured left breast implant with peripheral calcification. No chest wall mass. Signs of cervical spinal fusion with anterolisthesis of C7 on T1 and extensive degenerative changes at the cervicothoracic junction. No destructive bone finding. IMPRESSION: 1. Large bilateral pleural effusions with complete collapse of the left lower lobe and partial collapse of the lingula in the left chest. No current access to prior imaging which would allow for comparison. Currently in the process of attempting to retrieve prior imaging studies. 2. Variable enhancement of collapsed lung at the lung bases also raising the question of pneumonia with fluid in distal bronchi. This raises the question of current or prior pneumonia. 3. Spiculated nodule in the right upper lobe measures 12 x 11 mm. This is suspicious for malignancy. Though by report the patient has a history of a nodule which may have been improving. 4. Anasarca with body wall edema in addition to effusions. These results will be called to the ordering clinician or representative by the Radiologist Assistant, and communication documented in the PACS or [employer]. Electronically Signed: By: Zetta Bills M.D. On: 06/19/2019 17:12   CARDIAC CATHETERIZATION  Result Date: 06/27/2019  There is moderate to severe left ventricular systolic dysfunction.  LV end diastolic pressure is mildly elevated.  Hemodynamic findings consistent with mild pulmonary hypertension.  1. Normal coronary anatomy 2. Moderate to severe LV dysfunction. EF estimated at 35% 3. Mildly elevated LV filling pressures. 4. Mild pulmonary HTN 5. Good cardiac output. Index 4.1 Plan: medical management.   DG CHEST PORT 1 VIEW  Result Date: 06/25/2019 CLINICAL DATA:  Bilateral pleural effusions  with continued shortness of breath. EXAM: PORTABLE CHEST 1 VIEW COMPARISON:  06/24/2019 FINDINGS: There is a right arm PICC line with tip  at the cavoatrial junction. Or normal heart size. Decrease in left pleural effusion. Unchanged small to moderate right effusion. Unchanged appearance left lower lobe atelectasis. IMPRESSION: 1. Left lower lobe atelectasis. 2. Persistent right pleural effusion with decrease in left effusion. Electronically Signed   By: Kerby Moors M.D.   On: 06/25/2019 08:47   DG CHEST PORT 1 VIEW  Result Date: 06/24/2019 CLINICAL DATA:  Shortness of breath EXAM: PORTABLE CHEST 1 VIEW COMPARISON:  Yesterday FINDINGS: Normal heart size and mediastinal contours. Right PICC with tip at the upper right atrium. Bilateral pleural effusion with borderline improvement on the right. The right effusion appears moderate and the left effusion more small volume. Streaky density at the lung bases. No visible pneumothorax IMPRESSION: Bilateral pleural effusion that are stable to mildly decreased. No new abnormality. Electronically Signed   By: Monte Fantasia M.D.   On: 06/24/2019 08:33   DG CHEST PORT 1 VIEW  Result Date: 06/23/2019 CLINICAL DATA:  Shortness of breath, large pleural effusion. EXAM: PORTABLE CHEST 1 VIEW COMPARISON:  06/21/2019 and CT chest 06/19/2019. FINDINGS: Right PICC terminates in the low SVC or at the SVC RA junction. Trachea is midline. Heart size stable. There are bilateral pleural effusions, large on the right and moderate on the left, similar to 06/21/2019. There may be mild mid and lower lung zone interstitial prominence and indistinctness. Right humeral head is high riding and there are degenerative changes in the right shoulder. IMPRESSION: 1. Bilateral pleural effusions, moderate on the right and small on the left, similar. 2. Suspect mild pulmonary edema with bibasilar atelectasis. Electronically Signed   By: Lorin Picket M.D.   On: 06/23/2019 07:56   DG CHEST PORT 1 VIEW  Result Date: 06/21/2019 CLINICAL DATA:  Shortness of breath and pleural effusion EXAM: PORTABLE CHEST 1 VIEW COMPARISON:  June 20, 2019 FINDINGS: Right PICC line is identified unchanged. The mediastinal contour and cardiac silhouette are stable. Small to moderate left pleural effusion is increased compared prior exam. Moderate right pleural effusion is unchanged. Scoliosis with prior postsurgical change of cervical spine are noted unchanged. IMPRESSION: Small to moderate left pleural effusion increased compared prior exam. Moderate right pleural effusion is unchanged. Electronically Signed   By: Abelardo Diesel M.D.   On: 06/21/2019 11:19   ECHOCARDIOGRAM COMPLETE  Result Date: 06/20/2019    ECHOCARDIOGRAM REPORT   Patient Name:   DARIANNE MURALLES Berne Date of Exam: 06/20/2019 Medical Rec #:  [id number]      Height:       62.0 in Accession #:    [id number]     Weight:       160.5 lb Date of Birth:  03-06-1946       BSA:          1.741 m Patient Age:    38 years       BP:           112/63 mmHg Patient Gender: F              HR:           90 bpm. Exam Location:  Inpatient Procedure: 2D Echo Indications:    Dyspnea R06.00  History:        Patient has no prior history of Echocardiogram examinations.  Sonographer:    Mikki Santee RDCS (AE) Referring Phys: [phone]  OMAIR LATIF Narcissa  1. Left ventricular ejection fraction, by estimation, is 20 to 25%. The left ventricle has severely decreased function. The left ventricle demonstrates global hypokinesis. Indeterminate diastolic filling due to E-A fusion.  2. Right ventricular systolic function is mildly reduced. The right ventricular size is moderately enlarged. There is moderately elevated pulmonary artery systolic pressure. The estimated right ventricular systolic pressure is 12.8 mmHg.  3. Right atrial size was mild to moderately dilated.  4. The mitral valve is grossly normal. Mild to moderate mitral valve regurgitation.  5. Tricuspid valve regurgitation is moderate.  6. The aortic valve is tricuspid. Aortic valve regurgitation is not visualized. No aortic stenosis is present.  7. The  inferior vena cava is dilated in size with <50% respiratory variability, suggesting right atrial pressure of 15 mmHg. FINDINGS  Left Ventricle: Left ventricular ejection fraction, by estimation, is 20 to 25%. The left ventricle has severely decreased function. The left ventricle demonstrates global hypokinesis. The left ventricular internal cavity size was normal in size. There is no left ventricular hypertrophy. Abnormal (paradoxical) septal motion, consistent with left bundle branch block. Indeterminate diastolic filling due to E-A fusion. Right Ventricle: The right ventricular size is moderately enlarged. No increase in right ventricular wall thickness. Right ventricular systolic function is mildly reduced. There is moderately elevated pulmonary artery systolic pressure. The tricuspid regurgitant velocity is 3.08 m/s, and with an assumed right atrial pressure of 15 mmHg, the estimated right ventricular systolic pressure is 78.6 mmHg. Left Atrium: Left atrial size was normal in size. Right Atrium: Right atrial size was mild to moderately dilated. Pericardium: There is no evidence of pericardial effusion. Presence of pericardial fat pad. Mitral Valve: The mitral valve is grossly normal. Mild to moderate mitral valve regurgitation. Tricuspid Valve: The tricuspid valve is grossly normal. Tricuspid valve regurgitation is moderate. Aortic Valve: The aortic valve is tricuspid. Aortic valve regurgitation is not visualized. No aortic stenosis is present. Pulmonic Valve: The pulmonic valve was grossly normal. Pulmonic valve regurgitation is not visualized. No evidence of pulmonic stenosis. Aorta: The aortic root and ascending aorta are structurally normal, with no evidence of dilitation. Venous: The inferior vena cava is dilated in size with less than 50% respiratory variability, suggesting right atrial pressure of 15 mmHg. IAS/Shunts: The atrial septum is grossly normal. Additional Comments: A venous catheter is  visualized in the right atrium. There is a small pleural effusion in the left lateral region.  LEFT VENTRICLE PLAX 2D LVIDd:         4.50 cm     Diastology LVIDs:         3.60 cm     LV e' lateral:   6.30 cm/s LV PW:         1.00 cm     LV E/e' lateral: 12.9 LV IVS:        0.80 cm     LV e' medial:    4.06 cm/s LVOT diam:     1.90 cm     LV E/e' medial:  20.0 LV SV:         31 LV SV Index:   18 LVOT Area:     2.84 cm  LV Volumes (MOD) LV vol d, MOD A2C: 79.9 ml LV vol d, MOD A4C: 72.4 ml LV vol s, MOD A2C: 51.7 ml LV vol s, MOD A4C: 45.7 ml LV SV MOD A2C:     28.2 ml LV SV MOD A4C:     72.4 ml  LV SV MOD BP:      29.5 ml RIGHT VENTRICLE RV S prime:     8.70 cm/s TAPSE (M-mode): 1.2 cm LEFT ATRIUM             Index       RIGHT ATRIUM           Index LA diam:        3.50 cm 2.01 cm/m  RA Area:     16.90 cm LA Vol (A2C):   36.2 ml 20.79 ml/m RA Volume:   50.10 ml  28.78 ml/m LA Vol (A4C):   41.3 ml 23.72 ml/m LA Biplane Vol: 38.8 ml 22.29 ml/m  AORTIC VALVE LVOT Vmax:   70.00 cm/s LVOT Vmean:  44.400 cm/s LVOT VTI:    0.108 m  AORTA Ao Root diam: 2.30 cm Ao Asc diam:  3.00 cm MITRAL VALVE               TRICUSPID VALVE MV Area (PHT): 3.99 cm    TR Peak grad:   37.9 mmHg MV Decel Time: 190 msec    TR Vmax:        308.00 cm/s MR Peak grad: 33.6 mmHg MR Vmax:      290.00 cm/s  SHUNTS MV E velocity: 81.00 cm/s  Systemic VTI:  0.11 m MV A velocity: 72.20 cm/s  Systemic Diam: 1.90 cm MV E/A ratio:  1.12 Eleonore Chiquito MD Electronically signed by Eleonore Chiquito MD Signature Date/Time: 06/20/2019/4:09:00 PM    Final    VAS [place] LOWER EXTREMITY VENOUS (DVT)  Result Date: 06/22/2019  Lower Venous DVTStudy Indications: Edema.  Comparison Study: no prior study Performing Technologist: Sharion Dove RVS  Examination Guidelines: A complete evaluation includes B-mode imaging, spectral Doppler, color Doppler, and power Doppler as needed of all accessible portions of each vessel. Bilateral testing is considered an integral part  of a complete examination. Limited examinations for reoccurring indications may be performed as noted. The reflux portion of the exam is performed with the patient in reverse Trendelenburg.  +---------+---------------+---------+-----------+----------+--------------+ RIGHT    CompressibilityPhasicitySpontaneityPropertiesThrombus Aging +---------+---------------+---------+-----------+----------+--------------+ CFV      Full                                         pulsatile      +---------+---------------+---------+-----------+----------+--------------+ SFJ      Full                                                        +---------+---------------+---------+-----------+----------+--------------+ FV Prox  Full                                                        +---------+---------------+---------+-----------+----------+--------------+ FV Mid   Full                                                        +---------+---------------+---------+-----------+----------+--------------+ FV  DistalFull                                                        +---------+---------------+---------+-----------+----------+--------------+ PFV      Full                                                        +---------+---------------+---------+-----------+----------+--------------+ POP      Full                                         pulsatile      +---------+---------------+---------+-----------+----------+--------------+ PTV      Full                                                        +---------+---------------+---------+-----------+----------+--------------+ PERO     Full                                                        +---------+---------------+---------+-----------+----------+--------------+   +---------+---------------+---------+-----------+----------+--------------+ LEFT     CompressibilityPhasicitySpontaneityPropertiesThrombus Aging  +---------+---------------+---------+-----------+----------+--------------+ CFV      Full                                         pulsatile      +---------+---------------+---------+-----------+----------+--------------+ SFJ      Full                                                        +---------+---------------+---------+-----------+----------+--------------+ FV Prox  Full                                                        +---------+---------------+---------+-----------+----------+--------------+ FV Mid   Full                                                        +---------+---------------+---------+-----------+----------+--------------+ FV DistalFull                                                        +---------+---------------+---------+-----------+----------+--------------+  PFV      Full                                                        +---------+---------------+---------+-----------+----------+--------------+ POP      Full                                         pulsatile      +---------+---------------+---------+-----------+----------+--------------+ PTV      Full                                                        +---------+---------------+---------+-----------+----------+--------------+ PERO     Full                                                        +---------+---------------+---------+-----------+----------+--------------+     Summary: BILATERAL: - No evidence of deep vein thrombosis seen in the lower extremities, bilaterally.   *See table(s) above for measurements and observations. Electronically signed by Deitra Mayo MD on 06/22/2019 at 6:39:29 PM.    Final    IR THORACENTESIS ASP PLEURAL SPACE W/IMG GUIDE  Result Date: 06/20/2019 INDICATION: Large bilateral pleural effusions with complete left lower lobe collapse, partial collapse of lingula, and spiculated right upper lobe nodule. Request for diagnostic  and therapeutic thoracentesis. EXAM: ULTRASOUND GUIDED LEFT THORACENTESIS MEDICATIONS: 1% lidocaine 10 mL COMPLICATIONS: None immediate. PROCEDURE: An ultrasound guided thoracentesis was thoroughly discussed with the patient and questions answered. The benefits, risks, alternatives and complications were also discussed. The patient understands and wishes to proceed with the procedure. Written consent was obtained. Ultrasound was performed to localize and mark an adequate pocket of fluid in the left chest. The area was then prepped and draped in the normal sterile fashion. 1% Lidocaine was used for local anesthesia. Under ultrasound guidance a 6 Fr Safe-T-Centesis catheter was introduced. Thoracentesis was performed. The catheter was removed and a dressing applied. FINDINGS: A total of approximately 650 mL of clear yellow fluid was removed. Samples were sent to the laboratory as requested by the clinical team. IMPRESSION: Successful ultrasound guided left thoracentesis yielding 650 mL of pleural fluid. No pneumothorax on post-procedure chest x-ray. Read by: Gareth Eagle, PA-C Electronically Signed   By: Lucrezia Europe M.D.   On: 06/20/2019 10:11     Time Spent in minutes  30     Desiree Hane M.D on 06/29/2019 at 1:28 PM  To page go to [email or web address] - password [facility]

## 2019-06-29 NOTE — Progress Notes (Addendum)
Order received to obtain pulmonary O2 saturation walking test.  Pt is refusing to get up out of her bed stating that she has been up five times today.  Pt is unable/unwilling to walk, however, pt has been up to the bedside commode and chair approximately three times during this shift.  Pt has not required supplemental oxygen either at rest or while transferring from bed to chair/bedside commode during this shift with O2 saturations remaining in the 90's on room air.  RN's from previous two shifts state that patient has not required supplemental O2.  Will continue to monitor.

## 2019-06-30 ENCOUNTER — Encounter (HOSPITAL_COMMUNITY): Payer: Self-pay | Admitting: Family Medicine

## 2019-06-30 ENCOUNTER — Telehealth: Payer: Self-pay | Admitting: Physician Assistant

## 2019-06-30 NOTE — Progress Notes (Addendum)
Progress Note  Patient Name: Samantha Keller Date of Encounter: 06/30/2019  Primary Cardiologist: [name], MD  Subjective   Patient is feeling better today. No acute complaints. Dr. Margaretann Loveless discussed plan of care with daughters who would like to explore concept of CIR. They also inquired about establishing with Dr. Haroldine Laws in follow-up.  Inpatient Medications    Scheduled Meds: . Chlorhexidine Gluconate Cloth  6 each Topical Daily  . cycloSPORINE  1 drop Both Eyes BID  . DULoxetine  60 mg Oral Daily  . enoxaparin (LOVENOX) injection  40 mg Subcutaneous Q24H  . feeding supplement (ENSURE ENLIVE)  237 mL Oral BID BM  . folic acid  2 mg Oral Daily  . furosemide  40 mg Oral Daily  . gabapentin  600 mg Oral TID  . melatonin  6 mg Oral QHS  . metoprolol succinate  25 mg Oral BID WC  . multivitamin with minerals  1 tablet Oral Daily  . pantoprazole  40 mg Oral BID  . sacubitril-valsartan  1 tablet Oral BID  . sodium chloride flush  3 mL Intravenous Q12H  . sodium chloride flush  3 mL Intravenous Q12H  . sodium chloride flush  3 mL Intravenous Q12H   Continuous Infusions: . sodium chloride    . sodium chloride     PRN Meds: sodium chloride, sodium chloride, acetaminophen **OR** acetaminophen, baclofen, lidocaine, ondansetron (ZOFRAN) IV, polyethylene glycol, sodium chloride flush, sodium chloride flush   Vital Signs    Vitals:   06/29/19 1638 06/29/19 1939 06/30/19 0444 06/30/19 0746  BP: (!) 105/56 (!) 110/58 (!) 120/54 (!) 114/46  Pulse: 88 91 82 83  Resp: 14 14 15 18   Temp: 98.2 F (36.8 C) 98.5 F (36.9 C) 97.6 F (36.4 C) 97.9 F (36.6 C)  TempSrc: Oral Oral Oral Oral  SpO2: 96% 97% 96% 95%  Weight:   60.2 kg   Height:        Intake/Output Summary (Last 24 hours) at 06/30/2019 1538 Last data filed at 06/30/2019 1340 Gross per 24 hour  Intake 720 ml  Output 800 ml  Net -80 ml   Last 3 Weights 06/30/2019 06/29/2019 06/28/2019  Weight (lbs) 132 lb 11.5 oz 131 lb  2.8 oz 137 lb 2 oz  Weight (kg) 60.2 kg 59.5 kg 62.2 kg     Telemetry    NSR - Personally Reviewed  Physical Exam   GEN: No acute distress, thin WF HEENT: Normocephalic, atraumatic, sclera non-icteric. Neck: No JVD or bruits. Cardiac: RRR no murmurs, rubs, or gallops.  Radials/DP/PT 1+ and equal bilaterally.  Respiratory: Generally diminished but no wheezes, rales or rhonchi. Breathing is unlabored. GI: Soft, nontender, non-distended, BS +x 4. MS: no deformity. Extremities: No clubbing or cyanosis. Trace BLE edema. Distal pedal pulses are 2+ and equal bilaterally. Neuro:  AAOx3. Follows commands. Psych:  Responds to questions appropriately with a normal affect.  Labs    High Sensitivity Troponin:   Recent Labs  Lab 06/19/19 1935  TROPONINIHS 12      Cardiac EnzymesNo results for input(s): TROPONINI in the last 168 hours. No results for input(s): TROPIPOC in the last 168 hours.   Chemistry Recent Labs  Lab 06/24/19 0548 06/24/19 0548 06/25/19 0500 06/25/19 0500 06/26/19 0700 06/26/19 0700 06/27/19 0345 06/27/19 0345 06/27/19 0749 06/27/19 0752 06/28/19 0500  NA 134*   < > 130*   < > 135   < > 134*   < > 135 134* 135  K 3.4*   < > 4.0   < > 3.8   < > 3.9   < > 4.1 4.3 3.8  CL 87*   < > 87*   < > 92*  --  93*  --   --   --  93*  CO2 38*   < > 37*   < > 34*  --  32  --   --   --  34*  GLUCOSE 98   < > 95   < > 96  --  95  --   --   --  100*  BUN 13   < > 13   < > 12  --  15  --   --   --  19  CREATININE 0.61   < > 0.85   < > 0.59  --  0.57  --   --   --  0.53  CALCIUM 8.0*   < > 8.1*   < > 8.3*  --  8.4*  --   --   --  8.6*  PROT 5.9*  --  5.7*  --   --   --   --   --   --   --   --   ALBUMIN 2.0*  --  1.9*  --   --   --   --   --   --   --   --   AST 20  --  27  --   --   --   --   --   --   --   --   ALT 5  --  <5  --   --   --   --   --   --   --   --   ALKPHOS 123  --  128*  --   --   --   --   --   --   --   --   BILITOT 0.8  --  0.8  --   --   --   --    --   --   --   --   GFRNONAA >60   < > >60   < > >60  --  >60  --   --   --  >60  GFRAA >60   < > >60   < > >60  --  >60  --   --   --  >60  ANIONGAP 9   < > 6   < > 9  --  9  --   --   --  8   < > = values in this interval not displayed.     Hematology Recent Labs  Lab 06/26/19 0700 06/26/19 0700 06/27/19 0345 06/27/19 0345 06/27/19 0749 06/27/19 0752 06/28/19 0500  WBC 7.5  --  8.4  --   --   --  9.8  RBC 2.66*  --  2.75*  --   --   --  2.72*  HGB 8.4*   < > 8.9*   < > 9.9* 9.9* 8.7*  HCT 27.3*   < > 28.0*   < > 29.0* 29.0* 27.9*  MCV 102.6*  --  101.8*  --   --   --  102.6*  MCH 31.6  --  32.4  --   --   --  32.0  MCHC 30.8  --  31.8  --   --   --  31.2  RDW 16.2*  --  15.9*  --   --   --  15.9*  PLT 305  --  315  --   --   --  348   < > = values in this interval not displayed.    BNPNo results for input(s): BNP, PROBNP in the last 168 hours.   DDimer No results for input(s): DDIMER in the last 168 hours.   Radiology    No results found.  Cardiac Studies   2D Echo 06/20/19 1. Left ventricular ejection fraction, by estimation, is 20 to 25%. The  left ventricle has severely decreased function. The left ventricle  demonstrates global hypokinesis. Indeterminate diastolic filling due to  E-A fusion.  2. Right ventricular systolic function is mildly reduced. The right  ventricular size is moderately enlarged. There is moderately elevated  pulmonary artery systolic pressure. The estimated right ventricular  systolic pressure is 06.2 mmHg.  3. Right atrial size was mild to moderately dilated.  4. The mitral valve is grossly normal. Mild to moderate mitral valve  regurgitation.  5. Tricuspid valve regurgitation is moderate.  6. The aortic valve is tricuspid. Aortic valve regurgitation is not  visualized. No aortic stenosis is present.  7. The inferior vena cava is dilated in size with <50% respiratory  variability, suggesting right atrial pressure of 15 mmHg.    Patient Profile     74 y.o. female with history of RA, asthma, chronic pain, former tobacco, and recent complex admission for MSSA bacteremia in [place]. Per notes, she had a complicated hospitalization beginning in late January when she presented with fevers, confusion, and hypoxia, and was found to be hyponatremic with AKI, right upper lobe mass concerning for pneumonia versus cancer, MSSA bacteremia, lower extremity cellulitis with abscess, and upper GI bleed. She had TEE negative for vegetation, had vegetation at the tip of her central line, underwent I&D abscess, and was eventually discharged to a local SNF with PICC for long course of cefazolin. She has since established with local GI, pulmonology, and ID, and had an outpatient CT chest performed today with findings that prompted her current presentation. CT showed large bilateral pleural effusions, complete collapse of left lower lobe, partial collapse of the lingula, and spiculated nodule in the right upper lobe. She was directed to the ED for evaluation of this. She was found to have newly recognized cardiomyopathy with EF 20-25%. [facility] 06/27/19 showed normal coronaries, EF 35%, mildly elevated LV filling pressures, mild pulmonary HTN and good cardiac output. Medical management made challenging by intermittent low BP.   Assessment & Plan    1. Acute systolic CHF/NICM - etiology unclear, possibly stress induced cardiomyopathy from multitude of medical issues. Now on oral Lasix, Toprol, Entresto. Would continue present regimen. Do not feel BP will support more aggressive therapies. Weight 160 on admission, down to 132.7lb, -15.9L (also with unmeasured urine occurrences). Issue of Lifevest discussed yesterday but patient/family have elected not to pursue. This is reasonable since data is less suggestive of role in NICM.  2. Large bilateral effusions - felt transudative related to multiple issues with malnutrition, hypoalbuminemia, anasarca, heart  failure and recent MSSA PNA. Baseline requirement of O2 of 3L prior to admission per notes -> as of this AM, off oxygen and passed ambulation test.  3. HTN with hypotension this admission - BP lower end of normal but holding steady. Continue to monitor.  4. MSSA bacteremia with pnemonia, soft tisuse abscess/right hip infection - during recent admission.  Reported to have completed 6 weeks of cefazolin and has outpatient follow-up with ID. Abx plan per IM.  5. Abnormal TSH with normal free T4 - possible sick euthyroid syndrome. Will need OP f/u closely.  6. Hypervolemic hyponatremia - improved.  7. Anemia with recent UGIB - reported cautery at outside hospital. Hovering 8-9 range this admission. Macrocytic indices.  Dr. Margaretann Loveless discussed question of dispo with patient/family - they would like to explore CIR as an option. She discussed with the nursing staff who will reach out to primary team.  Have arranged TOC f/u 4/14 at 2:15pm with Richardson Dopp. Per pt/family's request, will also send message to CHF clinic to schedule patient for new patient visit to establish with Dr. Haroldine Laws in a few weeks. Pt requests they call daughter with appt info.  For questions or updates, please contact [place] Please consult [email or web address] for contact info under Cardiology/STEMI.  Signed, Charlie Pitter, PA-C 06/30/2019, 3:38 PM    Patient seen and examined with Melina Copa PA-C.  Agree as above, with the following exceptions and changes as noted below.  Discussed in detail with daughters Lenna Sciara and Threasa Beards by phone for approximately 25 minutes and with the patient for approximately 10 minutes.  Discussed the natural history of nonischemic cardiomyopathy, heart failure therapy, follow-up recommendations, decisions around LifeVest, volume optimization and heart failure, and duration of therapy (usually indefinite if well tolerated). Gen: NAD, CV: RRR, no murmurs, Lungs: clear, Abd: soft, Extrem: Warm, well  perfused, 1+ bilateral edema to the knee, Neuro/Psych: alert and oriented x 3, normal mood and affect. All available labs, radiology testing, previous records reviewed.  Discussed ongoing cardiovascular care in detail.  Family is requesting reassessment of placement, for a potentially more appropriate rehab facility that may watch her more closely medically.  I have addressed this with Dr. Lonny Prude who is the primary physician in the hospital, and this can be discussed further with social work.  Continue heart failure therapy with Toprol and Entresto.  Continue Lasix.  She still has volume on exam.  Elouise Munroe 06/30/19 4:04 PM

## 2019-06-30 NOTE — Progress Notes (Signed)
Pt transferring to 6N03 per MD order.  Report called to receiving RN.

## 2019-06-30 NOTE — Progress Notes (Signed)
SATURATION QUALIFICATIONS: (This note is used to comply with regulatory documentation for home oxygen)  Patient Saturations on Room Air at Rest = 98%  Patient Saturations on Room Air while Ambulating = 96%  Patient Saturations on 0 Liters of oxygen while Ambulating = 93-96%  Please briefly explain why patient needs home oxygen:

## 2019-06-30 NOTE — Progress Notes (Signed)
[facility]  PROGRESS NOTE  LORAY AKARD UXL:[id number] DOB: 1945/09/08 DOA: 06/19/2019 PCP: Patient, [name] Admit date - 06/19/2019   Admitting Physician [name], MD  Outpatient Primary MD for the patient is Patient, [name]  LOS - 10 Brief Narrative   MELLISSA CONLEY is a 74 y.o. year old female with medical history significant for recent hospitalization (discharged 05/14/19) in [place], [place] for MSSA cavitary pneumonia complicated by bacteremia for which she required mechanical ventilation for about 6 day presumed source was, left thigh cellulitis and bilateral hip abscesses (s/p I&D) on IV cefazolin, chronic hypoxic respiratory failure on 3 L with CT chest showin cavitation of right upper lobe infiltrat, and RA previously on anti-TNF therapy  who presented on 06/19/2019 from [place] to abnormal CT chest obtain an ID clinic for follow-up which showed large bilateral pleural effusions and complete collapse of left lower lobe with spiculated nodule right upper lobe.    Hospital course: Patient underwent IR guided thoracentesis (650 cc clear yellow fluid on (3/26,).  Cardiology was consulted for new acute systolic CHF.  Patient has been diuresing with IV Lasix which was briefly held due to soft BPs.  Patient underwent heart cath on 4/2 which showed normal coronaries and hemodynamic consistent with mild pulmonary hypertension.    Subjective  Today continues to deny chest pain, no SOB, no cough  A & P    Acute systolic nonischemicCHF, stable.  EF 20-25% on TTE, 35% on LHC -16 L during hospital stay,  only 1+ pitting edema of bilateral legs to knees which is stable.   . Meets criteria for LifeVest given depressed EF but family/patient declines, given nonischemic EF ok per cardiology -Appreciate cardiology recommendations, arranging outpt following --BP now allowing oral lasix ( closely monitor BP) -Continue medical management with Toprol and Entresto --plan  for follow up TTE in a few months as outpt   Bilateral transudative/culture-negative pleural effusions with chronic hypoxic respiratory failure, multifactorial etiology (malnutrition/anasarca/CHF/recent MSSA pneumonia), resolved Baseline O2 requirements of 3 L prior to admission, now off O2 after diuresis. No malignant cells on pleural fluid s/p L thoracentesis. Passed walking O2 test -monitor -Diurese as above  Hypertension,BP still runs a bit soft but SBP >100.  No longer getting iV diuresis should expect improvement. hgb stable and no signs or symptoms of bleeding.  -Closely monitor BP -BP Parameters in place for Toprol 25 mg twice daily, continue entresto  MSSA bacteremia, pneumonia, soft tissue abscess/right hip infection, present on admission.  Has completed 6-week course of cefazolin (end date 3/31) -Outpatient follow up with Dr. Megan Salon on 07/21/2019 --off all antibiotics  Severe/moderate malnutrition in setting of chronic illness -Appreciate nutrition recommendations, continue Ensure  Elevated TSH, normal free T4 -Euthyroid in setting of acute illness -Will need repeat TSH as outpatient.  Hyponatremia, in the setting of hypervolemia, improving.   -Closely monitor BMP, currently holding off on diuresis  Chronic Macrocytic anemia.  hemoglobin stable at baseline.  Has history of recent UGI bleed that was cauterized at OSH, no bleeding episodes here -Daily CBC -continue home folic acid -PPI  GERD, stable -Continue PPI  Peripheral neuropathy, stable -continue gabapentin  RA previously on Humira -Continue Cymbalta, gabapentin, as needed baclofen  --to follow up with outpt rheumatologist     Family Communication  : Updated daughter Lenna Sciara on patient's phone at bedside, on 4/5 Code Status : Full code  Disposition Plan  :  Patient is from rehab facility.  Anticipated d/c date: 24-48 hours. Barriers to d/c or necessity for inpatient status:   Medically stable. likely  return to SNF per PT recs--have asked PT to reassess for CIR per family request Consults  : Radiology, infectious disease, cardiology  Procedures  : IR guided thoracentesis, TTE  DVT Prophylaxis  :  Lovenox   Lab Results  Component Value Date   PLT 348 06/28/2019    Diet :  Diet Order            Diet Heart Room service appropriate? Yes with Assist; Fluid consistency: Thin  Diet effective now               Inpatient Medications Scheduled Meds: . Chlorhexidine Gluconate Cloth  6 each Topical Daily  . cycloSPORINE  1 drop Both Eyes BID  . DULoxetine  60 mg Oral Daily  . enoxaparin (LOVENOX) injection  40 mg Subcutaneous Q24H  . feeding supplement (ENSURE ENLIVE)  237 mL Oral BID BM  . folic acid  2 mg Oral Daily  . furosemide  40 mg Oral Daily  . gabapentin  600 mg Oral TID  . melatonin  6 mg Oral QHS  . metoprolol succinate  25 mg Oral BID WC  . multivitamin with minerals  1 tablet Oral Daily  . pantoprazole  40 mg Oral BID  . sacubitril-valsartan  1 tablet Oral BID  . sodium chloride flush  3 mL Intravenous Q12H  . sodium chloride flush  3 mL Intravenous Q12H  . sodium chloride flush  3 mL Intravenous Q12H   Continuous Infusions: . sodium chloride    . sodium chloride     PRN Meds:.sodium chloride, sodium chloride, acetaminophen **OR** acetaminophen, baclofen, lidocaine, ondansetron (ZOFRAN) IV, polyethylene glycol, sodium chloride flush, sodium chloride flush  Antibiotics  :   Anti-infectives (From admission, onward)   Start     Dose/Rate Route Frequency Ordered Stop   06/19/19 2315  doxycycline (VIBRA-TABS) tablet 100 mg  Status:  Discontinued     100 mg Oral 2 times daily 06/19/19 2304 06/29/19 1343   06/19/19 2315  ceFAZolin (ANCEF) IVPB 2g/100 mL premix     2 g 200 mL/hr over 30 Minutes Intravenous Every 8 hours 06/19/19 2306 06/26/19 1048       Objective   Vitals:   06/29/19 1939 06/30/19 0444 06/30/19 0746 06/30/19 1614  BP: (!) 110/58 (!) 120/54  (!) 114/46 (!) 97/59  Pulse: 91 82 83 85  Resp: 14 15 18 14   Temp: 98.5 F (36.9 C) 97.6 F (36.4 C) 97.9 F (36.6 C) 97.9 F (36.6 C)  TempSrc: Oral Oral Oral Oral  SpO2: 97% 96% 95% 96%  Weight:  60.2 kg    Height:        SpO2: 96 % O2 Flow Rate (L/min): 2 L/min  Wt Readings from Last 3 Encounters:  06/30/19 60.2 kg  06/11/19 74.9 kg     Intake/Output Summary (Last 24 hours) at 06/30/2019 1744 Last data filed at 06/30/2019 1340 Gross per 24 hour  Intake 720 ml  Output 800 ml  Net -80 ml    Physical Exam:  Awake Alert, Oriented X 3, elderly female in no distress No new F.N deficits,  [place].AT, Normal respiratory effort on room air, normal breath sounds RRR,No Gallops,Rubs or new Murmurs,  +ve B.Sounds, Abd Soft, No tenderness, No rebound, guarding or rigidity. 1+ pitting edema bilaterally to just below knees   I have personally reviewed the following:   Data  Reviewed:  CBC Recent Labs  Lab 06/24/19 0548 06/24/19 0548 06/25/19 0500 06/25/19 0500 06/26/19 0700 06/27/19 0345 06/27/19 0749 06/27/19 0752 06/28/19 0500  WBC 9.8  --  8.8  --  7.5 8.4  --   --  9.8  HGB 9.3*   < > 9.1*   < > 8.4* 8.9* 9.9* 9.9* 8.7*  HCT 29.9*   < > 28.7*   < > 27.3* 28.0* 29.0* 29.0* 27.9*  PLT 331  --  325  --  305 315  --   --  348  MCV 104.5*  --  104.0*  --  102.6* 101.8*  --   --  102.6*  MCH 32.5  --  33.0  --  31.6 32.4  --   --  32.0  MCHC 31.1  --  31.7  --  30.8 31.8  --   --  31.2  RDW 16.5*  --  16.5*  --  16.2* 15.9*  --   --  15.9*  LYMPHSABS 3.6  --  3.4  --   --   --   --   --   --   MONOABS 0.8  --  0.8  --   --   --   --   --   --   EOSABS 0.4  --  0.4  --   --   --   --   --   --   BASOSABS 0.1  --  0.0  --   --   --   --   --   --    < > = values in this interval not displayed.    Chemistries  Recent Labs  Lab 06/24/19 0548 06/24/19 0548 06/25/19 0500 06/25/19 0500 06/26/19 0700 06/27/19 0345 06/27/19 0749 06/27/19 0752 06/28/19 0500  NA 134*    < > 130*   < > 135 134* 135 134* 135  K 3.4*   < > 4.0   < > 3.8 3.9 4.1 4.3 3.8  CL 87*  --  87*  --  92* 93*  --   --  93*  CO2 38*  --  37*  --  34* 32  --   --  34*  GLUCOSE 98  --  95  --  96 95  --   --  100*  BUN 13  --  13  --  12 15  --   --  19  CREATININE 0.61  --  0.85  --  0.59 0.57  --   --  0.53  CALCIUM 8.0*  --  8.1*  --  8.3* 8.4*  --   --  8.6*  MG 1.7  --  1.8  --   --   --   --   --   --   AST 20  --  27  --   --   --   --   --   --   ALT 5  --  <5  --   --   --   --   --   --   ALKPHOS 123  --  128*  --   --   --   --   --   --   BILITOT 0.8  --  0.8  --   --   --   --   --   --    < > = values in this interval not displayed.   ------------------------------------------------------------------------------------------------------------------ No  results for input(s): CHOL, HDL, LDLCALC, TRIG, CHOLHDL, LDLDIRECT in the last 72 hours.  No results found for: HGBA1C ------------------------------------------------------------------------------------------------------------------ No results for input(s): TSH, T4TOTAL, T3FREE, THYROIDAB in the last 72 hours.  Invalid input(s): FREET3 ------------------------------------------------------------------------------------------------------------------ No results for input(s): VITAMINB12, FOLATE, FERRITIN, TIBC, IRON, RETICCTPCT in the last 72 hours.  Coagulation profile No results for input(s): INR, PROTIME in the last 168 hours.  No results for input(s): DDIMER in the last 72 hours.  Cardiac Enzymes No results for input(s): CKMB, TROPONINI, MYOGLOBIN in the last 168 hours.  Invalid input(s): CK ------------------------------------------------------------------------------------------------------------------    Component Value Date/Time   BNP 1,843.9 (H) 06/19/2019 1940    Micro Results Recent Results (from the past 240 hour(s))  SARS CORONAVIRUS 2 (TAT 6-24 HRS) Nasopharyngeal Nasopharyngeal Swab     Status:  None   Collection Time: 06/29/19 11:42 AM   Specimen: Nasopharyngeal Swab  Result Value Ref Range Status   SARS Coronavirus 2 NEGATIVE NEGATIVE Final    Comment: (NOTE) SARS-CoV-2 target nucleic acids are NOT DETECTED. The SARS-CoV-2 RNA is generally detectable in upper and lower respiratory specimens during the acute phase of infection. Negative results do not preclude SARS-CoV-2 infection, do not rule out co-infections with other pathogens, and should not be used as the sole basis for treatment or other patient management decisions. Negative results must be combined with clinical observations, patient history, and epidemiological information. The expected result is Negative. Fact Sheet for Patients: SugarRoll.be Fact Sheet for Healthcare Providers: https://www.woods-mathews.com/ This test is not yet approved or cleared by the [place] FDA and  has been authorized for detection and/or diagnosis of SARS-CoV-2 by FDA under an Emergency Use Authorization (EUA). This EUA will remain  in effect (meaning this test can be used) for the duration of the COVID-19 declaration under Section 56 4(b)(1) of the Act, 21 U.S.C. section 360bbb-3(b)(1), unless the authorization is terminated or revoked sooner. Performed at [facility], [place] [address] [place], [place] [address]     Radiology Reports DG Chest 1 View  Result Date: 06/20/2019 CLINICAL DATA:  Prior thoracentesis. EXAM: CHEST  1 VIEW COMPARISON:  CT 02/19/2020. FINDINGS: PICC line noted with tip over cavoatrial junction. Heart size normal. Interim decrease in left-sided pleural effusion following thoracentesis. No pneumothorax. Persistent right pleural effusion. Persistent bibasilar atelectasis. Prior cervical spine fusion. IMPRESSION: 1.  Right PICC line noted with tip over cavoatrial junction. 2. Near complete resolution of left-sided pleural effusion following thoracentesis. No  evidence of pneumothorax. 3. Persistent right pleural effusion. Persistent bibasilar atelectasis. Electronically Signed   By: Marcello Moores  Register   On: 06/20/2019 09:59   CT CHEST W CONTRAST  Addendum Date: 06/25/2019   ADDENDUM REPORT: 06/25/2019 08:14 ADDENDUM: Prior images which include only plain film evaluations of the chest show evidence of effusions and basilar airspace disease with similar appearance. The CT comparison for the pulmonary nodule in the right upper lobe is not available to determine whether this is diminishing in size. Would suggest retrieval of prior images and short interval follow-up in 6-8 weeks from today's study which would be approximately 3 months from the initial exam. Comparison could be made to the more remote exams at that time. Electronically Signed   By: Zetta Bills M.D.   On: 06/25/2019 08:14   Result Date: 06/25/2019 CLINICAL DATA:  Abnormal x-ray. EXAM: CT CHEST WITH CONTRAST TECHNIQUE: Multidetector CT imaging of the chest was performed during intravenous contrast administration. CONTRAST:  77mL OMNIPAQUE IOHEXOL 300 MG/ML  SOLN COMPARISON:  None. FINDINGS: Cardiovascular: Heart size is enlarged without pericardial effusion. Aortic caliber is normal. Left vertebral arising directly from the aortic arch. Central pulmonary arteries are engorged. Main pulmonary artery measuring approximately 2.9 cm. Not well evaluated given bolus timing. Mediastinum/Nodes: No adenopathy in the mediastinum or hilar areas with no axillary lymphadenopathy. Lungs/Pleura: Large bilateral pleural effusions. Spiculated nodule in the right upper lobe measures 12 by 11 mm (image 30, series 7) there is complete collapse of the left lower lobe and partial collapse of the lingula in the left chest. Partial collapse of the right lower lobe due to bilateral pleural effusions. Variable enhancement of collapsed lung at the lung bases also raising the question pneumonia with fluid in distal bronchi. Large  airways are patent. Upper Abdomen: Incidental imaging of upper abdominal contents is unremarkable. Musculoskeletal: Diffuse body wall edema. Ruptured left breast implant with peripheral calcification. No chest wall mass. Signs of cervical spinal fusion with anterolisthesis of C7 on T1 and extensive degenerative changes at the cervicothoracic junction. No destructive bone finding. IMPRESSION: 1. Large bilateral pleural effusions with complete collapse of the left lower lobe and partial collapse of the lingula in the left chest. No current access to prior imaging which would allow for comparison. Currently in the process of attempting to retrieve prior imaging studies. 2. Variable enhancement of collapsed lung at the lung bases also raising the question of pneumonia with fluid in distal bronchi. This raises the question of current or prior pneumonia. 3. Spiculated nodule in the right upper lobe measures 12 x 11 mm. This is suspicious for malignancy. Though by report the patient has a history of a nodule which may have been improving. 4. Anasarca with body wall edema in addition to effusions. These results will be called to the ordering clinician or representative by the Radiologist Assistant, and communication documented in the PACS or [employer]. Electronically Signed: By: Zetta Bills M.D. On: 06/19/2019 17:12   CARDIAC CATHETERIZATION  Result Date: 06/27/2019  There is moderate to severe left ventricular systolic dysfunction.  LV end diastolic pressure is mildly elevated.  Hemodynamic findings consistent with mild pulmonary hypertension.  1. Normal coronary anatomy 2. Moderate to severe LV dysfunction. EF estimated at 35% 3. Mildly elevated LV filling pressures. 4. Mild pulmonary HTN 5. Good cardiac output. Index 4.1 Plan: medical management.   DG CHEST PORT 1 VIEW  Result Date: 06/25/2019 CLINICAL DATA:  Bilateral pleural effusions with continued shortness of breath. EXAM: PORTABLE CHEST 1 VIEW  COMPARISON:  06/24/2019 FINDINGS: There is a right arm PICC line with tip at the cavoatrial junction. Or normal heart size. Decrease in left pleural effusion. Unchanged small to moderate right effusion. Unchanged appearance left lower lobe atelectasis. IMPRESSION: 1. Left lower lobe atelectasis. 2. Persistent right pleural effusion with decrease in left effusion. Electronically Signed   By: Kerby Moors M.D.   On: 06/25/2019 08:47   DG CHEST PORT 1 VIEW  Result Date: 06/24/2019 CLINICAL DATA:  Shortness of breath EXAM: PORTABLE CHEST 1 VIEW COMPARISON:  Yesterday FINDINGS: Normal heart size and mediastinal contours. Right PICC with tip at the upper right atrium. Bilateral pleural effusion with borderline improvement on the right. The right effusion appears moderate and the left effusion more small volume. Streaky density at the lung bases. No visible pneumothorax IMPRESSION: Bilateral pleural effusion that are stable to mildly decreased. No new abnormality. Electronically Signed   By: Monte Fantasia M.D.   On: 06/24/2019 08:33   DG  CHEST PORT 1 VIEW  Result Date: 06/23/2019 CLINICAL DATA:  Shortness of breath, large pleural effusion. EXAM: PORTABLE CHEST 1 VIEW COMPARISON:  06/21/2019 and CT chest 06/19/2019. FINDINGS: Right PICC terminates in the low SVC or at the SVC RA junction. Trachea is midline. Heart size stable. There are bilateral pleural effusions, large on the right and moderate on the left, similar to 06/21/2019. There may be mild mid and lower lung zone interstitial prominence and indistinctness. Right humeral head is high riding and there are degenerative changes in the right shoulder. IMPRESSION: 1. Bilateral pleural effusions, moderate on the right and small on the left, similar. 2. Suspect mild pulmonary edema with bibasilar atelectasis. Electronically Signed   By: Lorin Picket M.D.   On: 06/23/2019 07:56   DG CHEST PORT 1 VIEW  Result Date: 06/21/2019 CLINICAL DATA:  Shortness of  breath and pleural effusion EXAM: PORTABLE CHEST 1 VIEW COMPARISON:  June 20, 2019 FINDINGS: Right PICC line is identified unchanged. The mediastinal contour and cardiac silhouette are stable. Small to moderate left pleural effusion is increased compared prior exam. Moderate right pleural effusion is unchanged. Scoliosis with prior postsurgical change of cervical spine are noted unchanged. IMPRESSION: Small to moderate left pleural effusion increased compared prior exam. Moderate right pleural effusion is unchanged. Electronically Signed   By: Abelardo Diesel M.D.   On: 06/21/2019 11:19   ECHOCARDIOGRAM COMPLETE  Result Date: 06/20/2019    ECHOCARDIOGRAM REPORT   Patient Name:   ERRICA DUTIL Bollig Date of Exam: 06/20/2019 Medical Rec #:  [id number]      Height:       62.0 in Accession #:    [id number]     Weight:       160.5 lb Date of Birth:  July 16, 1945       BSA:          1.741 m Patient Age:    21 years       BP:           112/63 mmHg Patient Gender: F              HR:           90 bpm. Exam Location:  Inpatient Procedure: 2D Echo Indications:    Dyspnea R06.00  History:        Patient has no prior history of Echocardiogram examinations.  Sonographer:    Mikki Santee RDCS (AE) Referring Phys: [id number] Georgina Quint LATIF [place]  1. Left ventricular ejection fraction, by estimation, is 20 to 25%. The left ventricle has severely decreased function. The left ventricle demonstrates global hypokinesis. Indeterminate diastolic filling due to E-A fusion.  2. Right ventricular systolic function is mildly reduced. The right ventricular size is moderately enlarged. There is moderately elevated pulmonary artery systolic pressure. The estimated right ventricular systolic pressure is 17.0 mmHg.  3. Right atrial size was mild to moderately dilated.  4. The mitral valve is grossly normal. Mild to moderate mitral valve regurgitation.  5. Tricuspid valve regurgitation is moderate.  6. The aortic valve is tricuspid. Aortic  valve regurgitation is not visualized. No aortic stenosis is present.  7. The inferior vena cava is dilated in size with <50% respiratory variability, suggesting right atrial pressure of 15 mmHg. FINDINGS  Left Ventricle: Left ventricular ejection fraction, by estimation, is 20 to 25%. The left ventricle has severely decreased function. The left ventricle demonstrates global hypokinesis. The left ventricular internal cavity size was normal in size. There is no  left ventricular hypertrophy. Abnormal (paradoxical) septal motion, consistent with left bundle branch block. Indeterminate diastolic filling due to E-A fusion. Right Ventricle: The right ventricular size is moderately enlarged. No increase in right ventricular wall thickness. Right ventricular systolic function is mildly reduced. There is moderately elevated pulmonary artery systolic pressure. The tricuspid regurgitant velocity is 3.08 m/s, and with an assumed right atrial pressure of 15 mmHg, the estimated right ventricular systolic pressure is 16.1 mmHg. Left Atrium: Left atrial size was normal in size. Right Atrium: Right atrial size was mild to moderately dilated. Pericardium: There is no evidence of pericardial effusion. Presence of pericardial fat pad. Mitral Valve: The mitral valve is grossly normal. Mild to moderate mitral valve regurgitation. Tricuspid Valve: The tricuspid valve is grossly normal. Tricuspid valve regurgitation is moderate. Aortic Valve: The aortic valve is tricuspid. Aortic valve regurgitation is not visualized. No aortic stenosis is present. Pulmonic Valve: The pulmonic valve was grossly normal. Pulmonic valve regurgitation is not visualized. No evidence of pulmonic stenosis. Aorta: The aortic root and ascending aorta are structurally normal, with no evidence of dilitation. Venous: The inferior vena cava is dilated in size with less than 50% respiratory variability, suggesting right atrial pressure of 15 mmHg. IAS/Shunts: The atrial  septum is grossly normal. Additional Comments: A venous catheter is visualized in the right atrium. There is a small pleural effusion in the left lateral region.  LEFT VENTRICLE PLAX 2D LVIDd:         4.50 cm     Diastology LVIDs:         3.60 cm     LV e' lateral:   6.30 cm/s LV PW:         1.00 cm     LV E/e' lateral: 12.9 LV IVS:        0.80 cm     LV e' medial:    4.06 cm/s LVOT diam:     1.90 cm     LV E/e' medial:  20.0 LV SV:         31 LV SV Index:   18 LVOT Area:     2.84 cm  LV Volumes (MOD) LV vol d, MOD A2C: 79.9 ml LV vol d, MOD A4C: 72.4 ml LV vol s, MOD A2C: 51.7 ml LV vol s, MOD A4C: 45.7 ml LV SV MOD A2C:     28.2 ml LV SV MOD A4C:     72.4 ml LV SV MOD BP:      29.5 ml RIGHT VENTRICLE RV S prime:     8.70 cm/s TAPSE (M-mode): 1.2 cm LEFT ATRIUM             Index       RIGHT ATRIUM           Index LA diam:        3.50 cm 2.01 cm/m  RA Area:     16.90 cm LA Vol (A2C):   36.2 ml 20.79 ml/m RA Volume:   50.10 ml  28.78 ml/m LA Vol (A4C):   41.3 ml 23.72 ml/m LA Biplane Vol: 38.8 ml 22.29 ml/m  AORTIC VALVE LVOT Vmax:   70.00 cm/s LVOT Vmean:  44.400 cm/s LVOT VTI:    0.108 m  AORTA Ao Root diam: 2.30 cm Ao Asc diam:  3.00 cm MITRAL VALVE               TRICUSPID VALVE MV Area (PHT): 3.99 cm    TR Peak grad:  37.9 mmHg MV Decel Time: 190 msec    TR Vmax:        308.00 cm/s MR Peak grad: 33.6 mmHg MR Vmax:      290.00 cm/s  SHUNTS MV E velocity: 81.00 cm/s  Systemic VTI:  0.11 m MV A velocity: 72.20 cm/s  Systemic Diam: 1.90 cm MV E/A ratio:  1.12 Eleonore Chiquito MD Electronically signed by Eleonore Chiquito MD Signature Date/Time: 06/20/2019/4:09:00 PM    Final    VAS [place] LOWER EXTREMITY VENOUS (DVT)  Result Date: 06/22/2019  Lower Venous DVTStudy Indications: Edema.  Comparison Study: no prior study Performing Technologist: Sharion Dove RVS  Examination Guidelines: A complete evaluation includes B-mode imaging, spectral Doppler, color Doppler, and power Doppler as needed of all accessible  portions of each vessel. Bilateral testing is considered an integral part of a complete examination. Limited examinations for reoccurring indications may be performed as noted. The reflux portion of the exam is performed with the patient in reverse Trendelenburg.  +---------+---------------+---------+-----------+----------+--------------+ RIGHT    CompressibilityPhasicitySpontaneityPropertiesThrombus Aging +---------+---------------+---------+-----------+----------+--------------+ CFV      Full                                         pulsatile      +---------+---------------+---------+-----------+----------+--------------+ SFJ      Full                                                        +---------+---------------+---------+-----------+----------+--------------+ FV Prox  Full                                                        +---------+---------------+---------+-----------+----------+--------------+ FV Mid   Full                                                        +---------+---------------+---------+-----------+----------+--------------+ FV DistalFull                                                        +---------+---------------+---------+-----------+----------+--------------+ PFV      Full                                                        +---------+---------------+---------+-----------+----------+--------------+ POP      Full                                         pulsatile      +---------+---------------+---------+-----------+----------+--------------+ PTV      Full                                                        +---------+---------------+---------+-----------+----------+--------------+  PERO     Full                                                        +---------+---------------+---------+-----------+----------+--------------+   +---------+---------------+---------+-----------+----------+--------------+ LEFT      CompressibilityPhasicitySpontaneityPropertiesThrombus Aging +---------+---------------+---------+-----------+----------+--------------+ CFV      Full                                         pulsatile      +---------+---------------+---------+-----------+----------+--------------+ SFJ      Full                                                        +---------+---------------+---------+-----------+----------+--------------+ FV Prox  Full                                                        +---------+---------------+---------+-----------+----------+--------------+ FV Mid   Full                                                        +---------+---------------+---------+-----------+----------+--------------+ FV DistalFull                                                        +---------+---------------+---------+-----------+----------+--------------+ PFV      Full                                                        +---------+---------------+---------+-----------+----------+--------------+ POP      Full                                         pulsatile      +---------+---------------+---------+-----------+----------+--------------+ PTV      Full                                                        +---------+---------------+---------+-----------+----------+--------------+ PERO     Full                                                        +---------+---------------+---------+-----------+----------+--------------+  Summary: BILATERAL: - No evidence of deep vein thrombosis seen in the lower extremities, bilaterally.   *See table(s) above for measurements and observations. Electronically signed by Deitra Mayo MD on 06/22/2019 at 6:39:29 PM.    Final    IR THORACENTESIS ASP PLEURAL SPACE W/IMG GUIDE  Result Date: 06/20/2019 INDICATION: Large bilateral pleural effusions with complete left lower lobe collapse, partial collapse of  lingula, and spiculated right upper lobe nodule. Request for diagnostic and therapeutic thoracentesis. EXAM: ULTRASOUND GUIDED LEFT THORACENTESIS MEDICATIONS: 1% lidocaine 10 mL COMPLICATIONS: None immediate. PROCEDURE: An ultrasound guided thoracentesis was thoroughly discussed with the patient and questions answered. The benefits, risks, alternatives and complications were also discussed. The patient understands and wishes to proceed with the procedure. Written consent was obtained. Ultrasound was performed to localize and mark an adequate pocket of fluid in the left chest. The area was then prepped and draped in the normal sterile fashion. 1% Lidocaine was used for local anesthesia. Under ultrasound guidance a 6 Fr Safe-T-Centesis catheter was introduced. Thoracentesis was performed. The catheter was removed and a dressing applied. FINDINGS: A total of approximately 650 mL of clear yellow fluid was removed. Samples were sent to the laboratory as requested by the clinical team. IMPRESSION: Successful ultrasound guided left thoracentesis yielding 650 mL of pleural fluid. No pneumothorax on post-procedure chest x-ray. Read by: Gareth Eagle, PA-C Electronically Signed   By: Lucrezia Europe M.D.   On: 06/20/2019 10:11     Time Spent in minutes  30     Desiree Hane M.D on 06/30/2019 at 5:44 PM  To page go to [email or web address] - password [facility]

## 2019-06-30 NOTE — Telephone Encounter (Addendum)
Note entered in error. Originally planned to send message to [facility] pool for phone call after dc but patient going to facility (therefore per protocol does not need phone call). She has close f/u arranged.  Janne Faulk PA-C

## 2019-06-30 NOTE — TOC Progression Note (Signed)
Transition of Care [facility]) - Progression Note    Patient Details  Name: Samantha Keller MRN: [id number] Date of Birth: Feb 25, 1946  Transition of Care [facility] CM/SW [place], [place] Phone Number: 06/30/2019, 4:12 PM  Clinical Narrative:     CSW received call from patient's daughter, Lenna Sciara and Threasa Beards- they requested CSW send out referrals to other SNF for possible placement. Patient may return back to [facility] but they wanted to explore other options. CSW explained patient SNF process and re-authorization for SNF approval would be required.  CSW will follow up with the family tomorrow and provide bed offers if any.   Thurmond Butts, MSW, [place] Clinical Social Worker    Expected Discharge Plan: Skilled Nursing Facility Barriers to Discharge: Continued Medical Work up  Expected Discharge Plan and Services Expected Discharge Plan: [place] In-house Referral: Clinical Social Work                                             Social Determinants of Health (SDOH) Interventions    Readmission Risk Interventions No flowsheet data found.

## 2019-07-01 DIAGNOSIS — R911 Solitary pulmonary nodule: Secondary | ICD-10-CM

## 2019-07-01 DIAGNOSIS — E871 Hypo-osmolality and hyponatremia: Secondary | ICD-10-CM

## 2019-07-01 DIAGNOSIS — E440 Moderate protein-calorie malnutrition: Secondary | ICD-10-CM

## 2019-07-01 DIAGNOSIS — J9621 Acute and chronic respiratory failure with hypoxia: Secondary | ICD-10-CM

## 2019-07-01 DIAGNOSIS — M069 Rheumatoid arthritis, unspecified: Secondary | ICD-10-CM

## 2019-07-01 DIAGNOSIS — J90 Pleural effusion, not elsewhere classified: Secondary | ICD-10-CM

## 2019-07-01 DIAGNOSIS — I5021 Acute systolic (congestive) heart failure: Secondary | ICD-10-CM

## 2019-07-01 MED ORDER — ENSURE ENLIVE PO LIQD: 237.0000 mL | Freq: Two times a day (BID) | ORAL | 12 refills | Status: DC

## 2019-07-01 MED ORDER — SACUBITRIL-VALSARTAN 24-26 MG PO TABS: 1.0000 | ORAL_TABLET | Freq: Two times a day (BID) | ORAL | Status: DC

## 2019-07-01 MED ORDER — FUROSEMIDE 40 MG PO TABS: 40.0000 mg | ORAL_TABLET | Freq: Every day | ORAL | Status: DC

## 2019-07-01 MED ORDER — METOPROLOL SUCCINATE ER 25 MG PO TB24: 25.0000 mg | ORAL_TABLET | Freq: Two times a day (BID) | ORAL | Status: DC

## 2019-07-01 MED ORDER — ADULT MULTIVITAMIN W/MINERALS CH: 1.0000 | ORAL_TABLET | Freq: Every day | ORAL | Status: DC

## 2019-07-01 NOTE — Progress Notes (Signed)
Physical Therapy Treatment Patient Details Name: Samantha Keller MRN: [id number] DOB: 1946/01/30 Today's Date: 07/01/2019    History of Present Illness Pt is a 74 y/o female admitted secondary to bilateral pleural effusions. Pt is s/p thoracentesis. Found to have new CHF. Pt with recent hospitalization for MSSA pneumonia complicated by bacteremia, left thigh cellulitis, bilateral hip abscesses, pneumonia. PMH includes RA and HTN.  Pt is s/p heart cath.     PT Comments    Pt received in supine and agreeable to PT. Pt reporting pain in low back during mobility. Pt performed seated LE therex while nursing finishing pt care with chg wipes. Pt required min A to rise from bed with RW with min A to steady once standing do to posterior lean. Pt aware of posterior lean and requires min A and multimodal cuing to correct. Pt ambulating in room before requesting seated rest break in recliner. After rest pt agreeable to second bout of ambulation with encouragement. Pt with improved STS from recliner, still requiring min A to steady due to posterior lean but lean not quite as strong and pt better able to correct with less cuing and assist. Pt ambulating to curtain and room and back on second ambulation. Pt reporting feeling pretty wiped out after session. Pt will continue to benefit from further acute PT for further progression of strength, ROM, balance and endurance deficits. Recommendation for SNF remains appropriate. Do not feel pt is appropriate for CIR due to deconditioning and limited tolerance to exertional activity.     Follow Up Recommendations  SNF;Supervision/Assistance - 24 hour     Equipment Recommendations  None recommended by PT    Recommendations for Other Services       Precautions / Restrictions Precautions Precautions: Fall Restrictions Weight Bearing Restrictions: No    Mobility  Bed Mobility Overal bed mobility: Needs Assistance Bed Mobility: Sit to Supine     Supine to sit:  Min assist;HOB elevated     General bed mobility comments: light min A for trunk elevation and LE advancement to get EOB, increased time and effort  Transfers Overall transfer level: Needs assistance Equipment used: Rolling walker (2 wheeled) Transfers: Sit to/from Stand Sit to Stand: Min assist         General transfer comment: min A to stand from bed and from recliner, cuing for hand placement, min A to steady due to posterior lean with initial standing, pt requiring multimodal cuing for shifting weight anterior and bringing feet underneath her for improved balance and posture, on second trial from recliner posterior lean improved, first descent to recliner pt plopped down, educated on technique with second descent to recliner much better  Ambulation/Gait Ambulation/Gait assistance: Min guard Social research officer, government (Feet): 35 Feet Assistive device: Rolling walker (2 wheeled) Gait Pattern/deviations: Step-through pattern;Decreased stride length;Narrow base of support Gait velocity: dec   General Gait Details: pt ambulated from bed to curtain and back to recliner requiring seated rest before ambulating to curtain and back, pt fatigues quickly, pt required min cuing for RW mgt, narrow BOS and slow gait speed and short step length during gait   Stairs             Wheelchair Mobility    Modified Rankin (Stroke Patients Only)       Balance Overall balance assessment: Needs assistance Sitting-balance support: No upper extremity supported;Feet supported Sitting balance-Leahy Scale: Fair   Postural control: Posterior lean Standing balance support: Bilateral upper extremity supported;During functional activity Standing balance-Leahy Scale:  Poor Standing balance comment: Reliant on BUE support and external support with initial standing do to posterior lean                            Cognition Arousal/Alertness: Awake/alert Behavior During Therapy: WFL for tasks  assessed/performed Overall Cognitive Status: Within Functional Limits for tasks assessed                                        Exercises Total Joint Exercises Towel Squeeze: AROM;Both;15 reps Hip ABduction/ADduction: AROM;Both;15 reps;Seated(isometric against therapist resistance) Long Arc Quad: AROM;Both;10 reps Marching in Standing: AROM;Both;10 reps;Seated General Exercises - Lower Extremity Toe Raises: AROM;Both;15 reps Heel Raises: AROM;Both;15 reps    General Comments General comments (skin integrity, edema, etc.): VSS on RA      Pertinent Vitals/Pain Faces Pain Scale: Hurts little more Pain Location: low back Pain Descriptors / Indicators: Grimacing;Sore;Aching Pain Intervention(s): Monitored during session;Repositioned    Home Living                      Prior Function            PT Goals (current goals can now be found in the care plan section) Progress towards PT goals: Progressing toward goals    Frequency    Min 2X/week      PT Plan Current plan remains appropriate    Co-evaluation              AM-PAC PT "6 Clicks" Mobility   Outcome Measure  Help needed turning from your back to your side while in a flat bed without using bedrails?: A Little Help needed moving from lying on your back to sitting on the side of a flat bed without using bedrails?: A Little Help needed moving to and from a bed to a chair (including a wheelchair)?: A Little Help needed standing up from a chair using your arms (e.g., wheelchair or bedside chair)?: A Little Help needed to walk in hospital room?: A Little Help needed climbing 3-5 steps with a railing? : A Lot 6 Click Score: 17    End of Session Equipment Utilized During Treatment: Gait belt Activity Tolerance: Patient limited by fatigue Patient left: in chair;with call bell/phone within reach Nurse Communication: Mobility status PT Visit Diagnosis: Muscle weakness (generalized)  (M62.81);Unsteadiness on feet (R26.81)     Time: 0762-2633 PT Time Calculation (min) (ACUTE ONLY): 25 min  Charges:  $Therapeutic Exercise: 23-37 mins                     [name] PT, DPT 12:48 PM,07/01/19    [name] 07/01/2019, 12:43 PM

## 2019-07-01 NOTE — Discharge Summary (Signed)
Samantha Keller NWG:[id number] DOB: 03-Feb-1946 DOA: 06/19/2019  PCP: Patient, [name]  Admit date: 06/19/2019 Discharge date: 07/01/2019  Admitted From: SNF Disposition:  SNF [facility]  Recommendations for Outpatient Follow-up:  --Recommend repeat CT chest imaging in 6 to 8 weeks to ensure resolution of R spiculated nodule --New medications: lasix 40 mg daily, Toprol, entresto --PT recommends SNF as she will benefit from further acute PT for further progression of strength, ROM, balance, endurance deficits --Need repeat TSH in 6 to 8 weeks --Cardiology outpatient follow-up arranged, will need follow-up echo in a few months   Equipment/Devices: none  Discharge Condition:Stable  CODE STATUS:FULL  Diet recommendation: heart healthy  Brief/Interim Summary: History of present illness:  Samantha Keller is a 74 y.o. year old female with medical history significant for recent hospitalization (discharged 05/14/19) in [place], [place] for MSSA cavitary pneumonia complicated by bacteremia for which she required mechanical ventilation for about 6 day presumed source was, left thigh cellulitis and bilateral hip abscesses (s/p I&D) on IV cefazolin, chronic hypoxic respiratory failure on 3 L with CT chest showin cavitation of right upper lobe infiltrat, and RA previously on anti-TNF therapy  who presented on 06/19/2019 from [facility] facility due to abnormal CT chest obtained in ID clinic for follow-up which showed large bilateral pleural effusions and complete collapse of left lower lobe with spiculated nodule right upper lobe.    Hospital course: Patient underwent IR guided thoracentesis (650 cc clear yellow fluid on 3/26,).  Cardiology was consulted for new acute systolic CHF.  Patient was diruesed with IV Lasix which was briefly held due to soft BPs.  Patient underwent heart cath on 4/2 which showed normal coronaries and hemodynamic consistent with mild pulmonary hypertension. She was able  to tolerate oral lasix and maintain good volume in addition to entresto and Toprol for her new systolic CHF. Her chronic 3 L O2 was able to weaned to room air and maintain normal oxygen saturation on ambulation   Hospital Course:   Acute systolic CHF, nonischemic cardiomyopathy based off of left heart cath on 4/2 with nonobstructive Coronaries.  Etiology unclear.  Possible stress-induced cardiomyopathy related to multiple medical issues/infections.  Net -16 L on admission, 160 pounds on admission, down to 132.7 as dry weight. -Continue Lasix 40 mg daily, Toprol, Entresto -Family/patient elected not to pursue LifeVest -Cardiology follow-up arranged with Dr. Haroldine Keller, post discharge, will need follow-up echo in a few months  Hypertension.  With episodes of hypotension with SBP's in the 90s to 100s during this admission.  Blood pressure remained stable at low end of normal.With range 107/45-123/54 prior to discharge ---monitor BP Abrol 25 mg twice daily and Entresto  Bilateral transudative/culture-negative pleural effusions with chronic hypoxic respiratory failure, multifactorial etiology.  Includes malnutrition, anasarca, CHF, recent MSSA pneumonia, resolved.  3 L O2 prior to mission, patient is able to be weaned off.  No malignant cells of pleural fluid status post left thoracentesis.  Patient past amatory walking O2 test -Monitor  MSSA Bacteremia with pneumonia, soft tissue abscess/right hip infection.  During prior hospitalization.  Has completed 6 weeks of cefazolin (end date 3/31). -Outpatient follow-up with ID, Dr. Megan Keller on 07/21/2019 -No longer on antibiotics  Spiculated nodule right upper chest, decreasing in size.  On CT chest on 04/27/2019 was solid measuring 3.1 x 1.8 cm, then showed cavitary features on 05/06/2019 measuring 2 x 1.7 cm.  Repeat CT chest on 3/25 measures 1.1 x 1.1 cm -Per radiology findings more  compatible with resolving infection in this location perhaps atypical or  fungal repeat CT chest imaging -Recommend repeat CT chest imaging in 6 to 8 weeks to ensure resolution  Abnormal TSH with normal free T4.  Possible sick euthyroid syndrome -Will need repeat TSH in 6 weeks as outpatient  Hypervolemic hyponatremia.  Resolved with diuresis.  Chronic microcytic anemia with recent UGI bleed.  Status post cautery at outside hospital.  Hemoglobin remained stable 8-9 during hospitalization. -Continue home folic acid, PPI  Severe/moderate malnutrition in setting of chronic illness.  Albumin 1.9 -Continue Ensure  GERD, stable -continue PPI  Peripheral neuropathy, stable -Continue gabapentin  RA.  Previously on Humira. -Continue Cymbalta, gabapentin, as needed baclofen -To follow-up with outpatient rheumatologist.  Consultations:  Cardiology, Infectious Disease, IR  Procedures/Studies: Ir guided thoracentesis 26, 650 cc clear yellow fluid removed, no malignant cells identified on cytology  TTE, 3/26, EF 20-25%, demonstrates global hypokinesis  Venous duplex, 3/27-no evidence of DVT in lower extremities, bilaterally  Left heart cath, 4/2, normal coronary anatomy, moderate to severe LV dysfunction, EF 35%, mildly elevated LV filling pressures, mild pulmonary hypertension, good cardiac output Subjective:  Discharge Exam: Vitals:   07/01/19 0758 07/01/19 1235  BP: (!) 123/54 (!) 107/45  Pulse: 84 80  Resp: 18 16  Temp: 97.9 F (36.6 C) (!) 97.4 F (36.3 C)  SpO2: 95% 99%   Vitals:   06/30/19 1933 07/01/19 0258 07/01/19 0758 07/01/19 1235  BP: (!) 109/55 (!) 117/58 (!) 123/54 (!) 107/45  Pulse: 83 84 84 80  Resp: 16 17 18 16   Temp: 98.3 F (36.8 C) 97.7 F (36.5 C) 97.9 F (36.6 C) (!) 97.4 F (36.3 C)  TempSrc: Oral Oral Oral Oral  SpO2: 99% 95% 95% 99%  Weight:      Height:        Awake Alert, Oriented X 3, elderly female in no distress No new F.N deficits,  [place].AT, Normal respiratory effort on room air, normal breath  sounds RRR,No Gallops,Rubs or new Murmurs,  +ve B.Sounds, Abd Soft, No tenderness, No rebound, guarding or rigidity. Scant edema bilaterally at ankles  Discharge Diagnoses:  Principal Problem:   Large pleural effusion Active Problems:   Bacteremia due to methicillin susceptible Staphylococcus aureus (MSSA)   History of GI bleed   Rheumatoid arthritis (HCC)   Anemia   Nodule of right lung   Anasarca   Protein-calorie malnutrition, mild (HCC)   Pleural effusion   Acute systolic CHF (congestive heart failure) (HCC)   Metabolic acidosis   Malnutrition of moderate degree   Pressure injury of skin   Respiratory failure, acute-on-chronic (HCC)    Discharge Instructions  Discharge Instructions    Diet - low sodium heart healthy   Complete by: As directed    Increase activity slowly   Complete by: As directed      Allergies as of 07/01/2019      Reactions   Iodinated Diagnostic Agents Anaphylaxis   Shellfish Allergy Anaphylaxis   Shellfish-derived Products Anaphylaxis      Medication List    STOP taking these medications   ceFAZolin 1 g injection Commonly known as: ANCEF   doxycycline 100 MG tablet Commonly known as: VIBRA-TABS   metoprolol tartrate 25 MG tablet Commonly known as: LOPRESSOR     TAKE these medications   baclofen 10 MG tablet Commonly known as: LIORESAL Take 10 mg by mouth 3 (three) times daily as needed.   cycloSPORINE 0.05 % ophthalmic emulsion Commonly known as:  RESTASIS Place 1 drop into both eyes 2 (two) times daily.   DULoxetine 60 MG capsule Commonly known as: CYMBALTA Take 60 mg by mouth daily.   estradiol 0.0375 mg/24hr patch Commonly known as: CLIMARA - Dosed in mg/24 hr Place 0.0375 mg onto the skin as directed. Apply one patch to skin two times a week on sundays and wednesdays   feeding supplement (ENSURE ENLIVE) Liqd Take 237 mLs by mouth 2 (two) times daily between meals. Start taking on: July 02, 2019   ferrous sulfate 325  (65 FE) MG tablet Take 325 mg by mouth daily with breakfast.   folic acid 0.5 MG tablet Commonly known as: FOLVITE Take 2 tablets by mouth daily.   furosemide 40 MG tablet Commonly known as: LASIX Take 1 tablet (40 mg total) by mouth daily. Start taking on: July 02, 2019   gabapentin 600 MG tablet Commonly known as: NEURONTIN Take 600 mg by mouth 3 (three) times daily.   melatonin 5 MG Tabs Take 1 tablet by mouth at bedtime.   metoprolol succinate 25 MG 24 hr tablet Commonly known as: TOPROL-XL Take 1 tablet (25 mg total) by mouth 2 (two) times daily with a meal.   multivitamin with minerals Tabs tablet Take 1 tablet by mouth daily. Start taking on: July 02, 2019   pantoprazole 40 MG tablet Commonly known as: PROTONIX Take 40 mg by mouth 2 (two) times daily.   polyethylene glycol 17 g packet Commonly known as: MIRALAX / GLYCOLAX Take 17 g by mouth daily as needed for mild constipation.   sacubitril-valsartan 24-26 MG Commonly known as: ENTRESTO Take 1 tablet by mouth 2 (two) times daily.   Vitamin C 500 MG Caps Take 500 mg by mouth daily.       Contact information for follow-up providers    Richardson Dopp T, PA-C Follow up.   Specialties: Cardiology, Physician Assistant Why: [place] location - see appointment information below for 07/09/19. Nicki Reaper is one of the PAs that works closely with our cardiology team. We have also sent a message to the CHF clinic to arrange follow-up with Dr. Haroldine Keller. Contact information: [address] [place] [address] [phone]            Contact information for after-discharge care    [facility] .   Service: Skilled Nursing Contact information: [place] [phone]                 Allergies  Allergen Reactions  . Iodinated Diagnostic Agents Anaphylaxis  . Shellfish Allergy Anaphylaxis   . Shellfish-Derived Products Anaphylaxis        The results of significant diagnostics from this hospitalization (including imaging, microbiology, ancillary and laboratory) are listed below for reference.     Microbiology: Recent Results (from the past 240 hour(s))  SARS CORONAVIRUS 2 (TAT 6-24 HRS) Nasopharyngeal Nasopharyngeal Swab     Status: None   Collection Time: 06/29/19 11:42 AM   Specimen: Nasopharyngeal Swab  Result Value Ref Range Status   SARS Coronavirus 2 NEGATIVE NEGATIVE Final    Comment: (NOTE) SARS-CoV-2 target nucleic acids are NOT DETECTED. The SARS-CoV-2 RNA is generally detectable in upper and lower respiratory specimens during the acute phase of infection. Negative results do not preclude SARS-CoV-2 infection, do not rule out co-infections with other pathogens, and should not be used as the sole basis for treatment or other  patient management decisions. Negative results must be combined with clinical observations, patient history, and epidemiological information. The expected result is Negative. Fact Sheet for Patients: SugarRoll.be Fact Sheet for Healthcare Providers: https://www.woods-mathews.com/ This test is not yet approved or cleared by the [place] FDA and  has been authorized for detection and/or diagnosis of SARS-CoV-2 by FDA under an Emergency Use Authorization (EUA). This EUA will remain  in effect (meaning this test can be used) for the duration of the COVID-19 declaration under Section 56 4(b)(1) of the Act, 21 U.S.C. section 360bbb-3(b)(1), unless the authorization is terminated or revoked sooner. Performed at [facility], [place] [address] [place], [place] [address]      Labs: BNP (last 3 results) Recent Labs    06/19/19 1940  BNP 5,027.7*   Basic Metabolic Panel: Recent Labs  Lab 06/25/19 0500 06/25/19 0500 06/26/19 0700 06/27/19 0345 06/27/19 0749 06/27/19 0752  06/28/19 0500  NA 130*   < > 135 134* 135 134* 135  K 4.0   < > 3.8 3.9 4.1 4.3 3.8  CL 87*  --  92* 93*  --   --  93*  CO2 37*  --  34* 32  --   --  34*  GLUCOSE 95  --  96 95  --   --  100*  BUN 13  --  12 15  --   --  19  CREATININE 0.85  --  0.59 0.57  --   --  0.53  CALCIUM 8.1*  --  8.3* 8.4*  --   --  8.6*  MG 1.8  --   --   --   --   --   --   PHOS 3.3  --   --   --   --   --   --    < > = values in this interval not displayed.   Liver Function Tests: Recent Labs  Lab 06/25/19 0500  AST 27  ALT <5  ALKPHOS 128*  BILITOT 0.8  PROT 5.7*  ALBUMIN 1.9*   No results for input(s): LIPASE, AMYLASE in the last 168 hours. No results for input(s): AMMONIA in the last 168 hours. CBC: Recent Labs  Lab 06/25/19 0500 06/25/19 0500 06/26/19 0700 06/27/19 0345 06/27/19 0749 06/27/19 0752 06/28/19 0500  WBC 8.8  --  7.5 8.4  --   --  9.8  NEUTROABS 4.0  --   --   --   --   --   --   HGB 9.1*   < > 8.4* 8.9* 9.9* 9.9* 8.7*  HCT 28.7*   < > 27.3* 28.0* 29.0* 29.0* 27.9*  MCV 104.0*  --  102.6* 101.8*  --   --  102.6*  PLT 325  --  305 315  --   --  348   < > = values in this interval not displayed.   Cardiac Enzymes: No results for input(s): CKTOTAL, CKMB, CKMBINDEX, TROPONINI in the last 168 hours. BNP: Invalid input(s): POCBNP CBG: No results for input(s): GLUCAP in the last 168 hours. D-Dimer No results for input(s): DDIMER in the last 72 hours. Hgb A1c No results for input(s): HGBA1C in the last 72 hours. Lipid Profile No results for input(s): CHOL, HDL, LDLCALC, TRIG, CHOLHDL, LDLDIRECT in the last 72 hours. Thyroid function studies No results for input(s): TSH, T4TOTAL, T3FREE, THYROIDAB in the last 72 hours.  Invalid input(s): FREET3 Anemia work up No results for input(s): VITAMINB12, FOLATE,  FERRITIN, TIBC, IRON, RETICCTPCT in the last 72 hours. Urinalysis    Component Value Date/Time   COLORURINE AMBER (A) 06/19/2019 2250   APPEARANCEUR CLOUDY (A)  06/19/2019 2250   LABSPEC >1.046 (H) 06/19/2019 2250   PHURINE 6.0 06/19/2019 2250   GLUCOSEU 50 (A) 06/19/2019 2250   HGBUR NEGATIVE 06/19/2019 2250   BILIRUBINUR NEGATIVE 06/19/2019 2250   KETONESUR 5 (A) 06/19/2019 2250   PROTEINUR 30 (A) 06/19/2019 2250   NITRITE NEGATIVE 06/19/2019 2250   LEUKOCYTESUR LARGE (A) 06/19/2019 2250   Sepsis Labs Invalid input(s): PROCALCITONIN,  WBC,  LACTICIDVEN Microbiology Recent Results (from the past 240 hour(s))  SARS CORONAVIRUS 2 (TAT 6-24 HRS) Nasopharyngeal Nasopharyngeal Swab     Status: None   Collection Time: 06/29/19 11:42 AM   Specimen: Nasopharyngeal Swab  Result Value Ref Range Status   SARS Coronavirus 2 NEGATIVE NEGATIVE Final    Comment: (NOTE) SARS-CoV-2 target nucleic acids are NOT DETECTED. The SARS-CoV-2 RNA is generally detectable in upper and lower respiratory specimens during the acute phase of infection. Negative results do not preclude SARS-CoV-2 infection, do not rule out co-infections with other pathogens, and should not be used as the sole basis for treatment or other patient management decisions. Negative results must be combined with clinical observations, patient history, and epidemiological information. The expected result is Negative. Fact Sheet for Patients: SugarRoll.be Fact Sheet for Healthcare Providers: https://www.woods-mathews.com/ This test is not yet approved or cleared by the [place] FDA and  has been authorized for detection and/or diagnosis of SARS-CoV-2 by FDA under an Emergency Use Authorization (EUA). This EUA will remain  in effect (meaning this test can be used) for the duration of the COVID-19 declaration under Section 56 4(b)(1) of the Act, 21 U.S.C. section 360bbb-3(b)(1), unless the authorization is terminated or revoked sooner. Performed at [facility], [place] [address], [place], [place] [address]      Time coordinating  discharge: Over 30 minutes  SIGNED:   Desiree Hane, MD  [facility] 07/01/2019, 3:28 PM Pager   If 7PM-7AM, please contact night-coverage [email or web address] Password [email or web address]

## 2019-07-01 NOTE — TOC Transition Note (Signed)
Transition of Care [facility]) - CM/SW Discharge Note   Patient Details  Name: Samantha Keller MRN: [id number] Date of Birth: Nov 29, 1945  Transition of [facility]) CM/SW Contact:  Sharin Mons, RN Phone Number: 07/01/2019, 2:54 PM   Clinical Narrative:     Patient will DC to: [facility]. Anticipated DC date: 07/01/2019 Family notified: [facility] Transport by: Corey Harold   Per MD patient ready for DC today to [facility] . RN, patient, patient's family, and facility notified of DC. Discharge Summary and FL2 sent to facility. RN to call report prior to discharge ([phone]). DC packet on chart. Ambulance transport [phone]) requested will be requested once PICC line is d/c, nurse is aware.   RNCM will sign off for now as intervention is no longer needed. Please consult [place] again if new needs arise.   Final next level of care: Skilled Nursing Facility Barriers to Discharge: No Barriers Identified   Patient Goals and CMS Choice        Discharge Placement     Discharge Plan and Services In-house Referral: Clinical Social Work                Social Determinants of Health (SDOH) Interventions     Readmission Risk Interventions No flowsheet data found.

## 2019-07-01 NOTE — Progress Notes (Signed)
Nutrition Follow-up  DOCUMENTATION CODES:   Non-severe (moderate) malnutrition in context of chronic illness  INTERVENTION:   -Continue MVI with minerals daily -Continue Ensure Enlive po BID, each supplement provides 350 kcal and 20 grams of protein  NUTRITION DIAGNOSIS:   Moderate Malnutrition related to chronic illness(disordered eating) as evidenced by mild fat depletion, moderate muscle depletion.  Ongoing  GOAL:   Patient will meet greater than or equal to 90% of their needs  Progressing   MONITOR:   PO intake, Supplement acceptance, Weight trends, Labs, I & O's  REASON FOR ASSESSMENT:   Consult Assessment of nutrition requirement/status  ASSESSMENT:   Patient with PMH significant for depression, dysphagia, essential HTN, and RA. Presents this admission with large bilateral pleural effusions and R lung nodule.  Reviewed I/O's: -520 ml x 24 hours and -16.9 L since admission  UOP: 1 L x 24 hours  Pt sitting up in bed, playing a game on her tablet at time of visit. She reports that her appetite has improved and is eating most of her food on her meal trays. She is looking forward to eating salmon for lunch. Noted meal completion 50-100%. Pt is consuming Ensure supplements, however, doesn't think she needs the, because of improved oral intake. Discussed importance of good meal and supplement intake to promote healing.   Labs reviewed.     Most Recent Value  Orbital Region  Moderate depletion  Upper Arm Region  Mild depletion  Thoracic and Lumbar Region  Unable to assess  Buccal Region  Mild depletion  Temple Region  Mild depletion  Clavicle Bone Region  Moderate depletion  Clavicle and Acromion Bone Region  Mild depletion  Scapular Bone Region  Mild depletion  Dorsal Hand  Moderate depletion  Patellar Region  Mild depletion  Anterior Thigh Region  Mild depletion  Posterior Calf Region  Mild depletion  Edema (RD Assessment)  Moderate  Hair  Reviewed  Eyes   Reviewed  Mouth  Reviewed  Skin  Reviewed  Nails  Reviewed      Diet Order:   Diet Order            Diet - low sodium heart healthy        Diet Heart Room service appropriate? Yes with Assist; Fluid consistency: Thin  Diet effective now              EDUCATION NEEDS:   Education needs have been addressed  Skin:  Skin Assessment: Skin Integrity Issues: Skin Integrity Issues:: Stage I Stage I: sacrum  Last BM:  06/30/19  Height:   Ht Readings from Last 1 Encounters:  06/20/19 5\' 2"  (1.575 m)    Weight:   Wt Readings from Last 1 Encounters:  06/30/19 60.2 kg    Ideal Body Weight:     BMI:  Body mass index is 24.27 kg/m.  Estimated Nutritional Needs:   Kcal:  1700-1900 kcal  Protein:  85-100 grams  Fluid:  >/= 1.7 L/day    Loistine Chance, RD, LDN, [place] Registered Dietitian II Certified Diabetes Care and Education Specialist Please refer to [facility] for RD and/or RD on-call/weekend/after hours pager

## 2019-07-01 NOTE — NC FL2 (Signed)
[place] LEVEL OF CARE SCREENING TOOL     IDENTIFICATION  Patient Name: Samantha Keller Birthdate: 03-Feb-1946 Sex: female Admission Date (Current Location): 06/19/2019  [facility] and [place] Number:  Herbalist and Address:  [facility] [place], [place] [address], [place], [place] [address]      Provider Number: [id number]  Attending Physician Name and Address:  Desiree Hane, MD  Relative Name and Phone Number:  Melissa [phone]    Current Level of Care: Hospital Recommended Level of Care: [place] Prior Approval Number:    Date Approved/Denied:   PASRR Number: [id number]  Discharge Plan: SNF    Current Diagnoses: Patient Active Problem List   Diagnosis Date Noted  . Respiratory failure, acute-on-chronic ([place]) 06/27/2019  . Pressure injury of skin 06/26/2019  . Malnutrition of moderate degree 06/25/2019  . Acute systolic CHF (congestive heart failure) ([place]) 06/24/2019  . Metabolic acidosis 28/31/5176  . Protein-calorie malnutrition, mild ([place]) 06/20/2019  . Pleural effusion 06/20/2019  . Large pleural effusion 06/19/2019  . Nodule of right lung 06/19/2019  . Anasarca 06/19/2019  . Pleural effusion, bilateral   . Bacteremia due to methicillin susceptible Staphylococcus aureus (MSSA) 05/27/2019  . Cellulitis and abscess of lower extremity 05/27/2019  . History of GI bleed 05/27/2019  . Cavitary lesion of lung 05/27/2019  . Rheumatoid arthritis ([place]) 05/27/2019  . Anemia 05/27/2019    Orientation RESPIRATION BLADDER Height & Weight     Self, Time, Situation, Place  Normal Incontinent Weight: 132 lb 11.5 oz (60.2 kg) Height:  5\' 2"  (157.5 cm)  BEHAVIORAL SYMPTOMS/MOOD NEUROLOGICAL BOWEL NUTRITION STATUS      Continent Diet(please see discharge summary)  AMBULATORY STATUS COMMUNICATION OF NEEDS Skin   Limited Assist Verbally Skin abrasions(pressure injury,sacrum Mid distal, Stage I,  abrasion,right thigh, foam,lift dressing,PRN)                       Personal Care Assistance Level of Assistance  Bathing, Dressing, Feeding Bathing Assistance: Limited assistance Feeding assistance: Independent Dressing Assistance: Limited assistance     Functional Limitations Info  Sight, Hearing, Speech Sight Info: Adequate Hearing Info: Adequate Speech Info: Adequate    SPECIAL CARE FACTORS FREQUENCY  PT (By licensed PT), OT (By licensed OT)     PT Frequency: 3x per week OT Frequency: 3x per week            Contractures Contractures Info: Not present    Additional Factors Info  Code Status, Allergies Code Status Info: FULL Allergies Info: Iodinated Diagnostic Agents,Shellfish Allergy,Shellfish-derived Products           Current Medications (07/01/2019):  This is the current hospital active medication list Current Facility-Administered Medications  Medication Dose Route Frequency Provider Last Rate Last Admin  . 0.9 %  sodium chloride infusion  250 mL Intravenous PRN Martinique, Peter M, MD      . 0.9 %  sodium chloride infusion  250 mL Intravenous PRN Martinique, Peter M, MD      . acetaminophen (TYLENOL) tablet 650 mg  650 mg Oral Q6H PRN Martinique, Peter M, MD       Or  . acetaminophen (TYLENOL) suppository 650 mg  650 mg Rectal Q6H PRN Martinique, Peter M, MD      . baclofen (LIORESAL) tablet 10 mg  10 mg Oral TID PRN Martinique, Peter M, MD      . Chlorhexidine Gluconate Cloth 2 % PADS 6  each  6 each Topical Daily Martinique, Peter M, MD   6 each at 07/01/19 1042  . cycloSPORINE (RESTASIS) 0.05 % ophthalmic emulsion 1 drop  1 drop Both Eyes BID Martinique, Peter M, MD   1 drop at 07/01/19 1026  . DULoxetine (CYMBALTA) DR capsule 60 mg  60 mg Oral Daily Martinique, Peter M, MD   60 mg at 07/01/19 1027  . enoxaparin (LOVENOX) injection 40 mg  40 mg Subcutaneous Q24H Martinique, Peter M, MD   40 mg at 07/01/19 6237  . feeding supplement (ENSURE ENLIVE) (ENSURE ENLIVE) liquid 237 mL  237 mL  Oral BID BM Martinique, Peter M, MD   237 mL at 06/30/19 0836  . folic acid (FOLVITE) tablet 2 mg  2 mg Oral Daily Martinique, Peter M, MD   2 mg at 07/01/19 1027  . furosemide (LASIX) tablet 40 mg  40 mg Oral Daily Pixie Casino, MD   40 mg at 07/01/19 1028  . gabapentin (NEURONTIN) tablet 600 mg  600 mg Oral TID Martinique, Peter M, MD   600 mg at 07/01/19 1028  . lidocaine (XYLOCAINE) 1 % (with pres) injection    PRN Ardis Rowan, PA-C   10 mL at 06/20/19 0948  . melatonin tablet 6 mg  6 mg Oral QHS Martinique, Peter M, MD   6 mg at 06/30/19 2142  . metoprolol succinate (TOPROL-XL) 24 hr tablet 25 mg  25 mg Oral BID WC Oretha Milch D, MD   25 mg at 07/01/19 0916  . multivitamin with minerals tablet 1 tablet  1 tablet Oral Daily Martinique, Peter M, MD   1 tablet at 07/01/19 1028  . ondansetron (ZOFRAN) injection 4 mg  4 mg Intravenous Q6H PRN Martinique, Peter M, MD      . pantoprazole (PROTONIX) EC tablet 40 mg  40 mg Oral BID Martinique, Peter M, MD   40 mg at 07/01/19 1028  . polyethylene glycol (MIRALAX / GLYCOLAX) packet 17 g  17 g Oral Daily PRN Martinique, Peter M, MD   17 g at 06/30/19 6283  . sacubitril-valsartan (ENTRESTO) 24-26 mg per tablet  1 tablet Oral BID Fay Records, MD   1 tablet at 07/01/19 1028  . sodium chloride flush (NS) 0.9 % injection 3 mL  3 mL Intravenous Q12H Martinique, Peter M, MD   3 mL at 06/30/19 0837  . sodium chloride flush (NS) 0.9 % injection 3 mL  3 mL Intravenous PRN Martinique, Peter M, MD         Discharge Medications: Please see discharge summary for a list of discharge medications.  Relevant Imaging Results:  Relevant Lab Results:   Additional Information SSN [id number]  Vinie Sill, LCSWA

## 2019-07-01 NOTE — Progress Notes (Signed)
Report called to [place] patient will be discharging to. Report called to nurse Deatra Ina. PICC line removed, awaiting transport by [facility].

## 2019-07-02 ENCOUNTER — Inpatient Hospital Stay (HOSPITAL_COMMUNITY): Admission: RE | Admit: 2019-07-02 | Payer: Medicare PPO | Source: Ambulatory Visit

## 2019-07-02 ENCOUNTER — Telehealth: Payer: Self-pay

## 2019-07-02 NOTE — Telephone Encounter (Signed)
Dr Bryan Lemma I received a call from [place] about your patient's scheduled procedures. Patient was hospitalized with cardiac and pulmonary issues 06/19/19. Discharged 07/01/19 to [facility] for rehabilitation.  Anorectal manometry and EGD cancelled.  Daughter Melissa Mullinnex notified. She agrees with the decision. She is anxious to have the manometry done as soon as possible. "She is still having those bowel issues." Reviewed this with Dr Silverio Decamp. Agreed with cancellation of procedures at this time. Thanks

## 2019-07-03 ENCOUNTER — Other Ambulatory Visit (HOSPITAL_COMMUNITY): Payer: Medicare PPO

## 2019-07-04 ENCOUNTER — Encounter (HOSPITAL_COMMUNITY): Admission: RE | Payer: Self-pay | Source: Home / Self Care

## 2019-07-04 ENCOUNTER — Ambulatory Visit (HOSPITAL_COMMUNITY): Admission: RE | Admit: 2019-07-04 | Payer: Medicare PPO | Source: Home / Self Care | Admitting: Gastroenterology

## 2019-07-04 SURGERY — MANOMETRY, ANORECTAL

## 2019-07-07 ENCOUNTER — Ambulatory Visit (HOSPITAL_COMMUNITY): Admission: RE | Admit: 2019-07-07 | Payer: Medicare PPO | Source: Home / Self Care | Admitting: Gastroenterology

## 2019-07-07 ENCOUNTER — Encounter (HOSPITAL_COMMUNITY): Admission: RE | Payer: Self-pay | Source: Home / Self Care

## 2019-07-07 SURGERY — ESOPHAGOGASTRODUODENOSCOPY (EGD) WITH PROPOFOL
Anesthesia: Monitor Anesthesia Care

## 2019-07-09 ENCOUNTER — Encounter: Payer: Self-pay | Admitting: Physician Assistant

## 2019-07-09 ENCOUNTER — Ambulatory Visit (INDEPENDENT_AMBULATORY_CARE_PROVIDER_SITE_OTHER): Payer: Medicare PPO | Admitting: Physician Assistant

## 2019-07-09 ENCOUNTER — Other Ambulatory Visit: Payer: Self-pay

## 2019-07-09 VITALS — BP 68/40 | HR 98 | Ht 62.0 in

## 2019-07-09 DIAGNOSIS — I340 Nonrheumatic mitral (valve) insufficiency: Secondary | ICD-10-CM

## 2019-07-09 DIAGNOSIS — I9589 Other hypotension: Secondary | ICD-10-CM

## 2019-07-09 DIAGNOSIS — I5022 Chronic systolic (congestive) heart failure: Secondary | ICD-10-CM | POA: Diagnosis not present

## 2019-07-09 DIAGNOSIS — R918 Other nonspecific abnormal finding of lung field: Secondary | ICD-10-CM | POA: Diagnosis not present

## 2019-07-09 DIAGNOSIS — E861 Hypovolemia: Secondary | ICD-10-CM

## 2019-07-09 NOTE — Progress Notes (Addendum)
Cardiology Office Note:    Date:  07/09/2019   ID:  Samantha Keller, DOB 29-Oct-1945, MRN [id number]  PCP:  Patient, [name]  Cardiologist:  [name], MD   Electrophysiologist:  None   Referring MD: No ref. provider found   Chief Complaint:  Hospitalization Follow-up (acute CHF)    Patient Profile:    Samantha Keller is a 74 y.o. female with:   Chronic systolic CHF  Non-ischemic cardiomyopathy   Echocardiogram 05/2019: EF 20-25  Pulmonary hypertension   Cath 06/2019: normal coronary arteries  LBBB  Mitral regurgitation (mild to mod echocardiogram 05/2019)  Tricuspid regurgitation (mod by echo 05/2019)  L pleural effusion, s/p thoracentesis (05/2019)  Hypertension   Rheumatoid arthritis  Asthma  Chronic pain  Ex-smoker  Prolonged Admx in [place], [place] 03/2019 - HypoNa, AKI, MSSA bacteremia, RUL mass (pna vs CA), LE cellulitis, GI bleed; completed 6 weeks of antibiotics   RUL mass - possible due to resolving infection  Prior CV studies: R/L cardiac catheterization 06/27/2019 Normal coronary arteries EF 35 LVEDP 19, mean PA 33  Echocardiogram 06/20/2019 EF 20-25, mildly reduced RV SF, moderate RVE, RVSP 52.9 mmHg, mild-moderate RAE, mild-moderate MR, moderate TR   History of Present Illness:    Samantha Keller was admitted 3/25-4/6 with new onset systolic CHF.    The patient had been hospitalized in January 2021 in [place], [place] with MSSA pneumonia complicated by bacteremia (veg on cath tip).  She had a prolonged course with lower extremity cellulitis, bilateral hip abscesses requiring I&D, ventilator dependent respiratory failure, hyponatremia, AKI and GI bleed.  She was discharged to rehab.  She was referred to the emergency room on the date of admission due to an abnormal chest CT which demonstrated large bilateral pleural effusions, collapse of the left lower lobe and spiculated nodule in the right upper lobe.  She underwent thoracentesis for her L pleural  effusion.  This was transudative.  Echocardiogram demonstrated EF 20-25% and moderate pulmonary hypertension.  She was followed by cardiology.  Cardiac catheterization demonstrated normal coronary arteries.  She was diuresed with IV Lasix.  Blood pressure limited CHF medication adjustment somewhat.  The patient and her family declined proceeding with LifeVest.  Plan is to ultimately refer her to the CHF clinic for further management.  CT did demonstrate improving spiculated mass in the right upper lobe which was felt to likely be resolving infection.  Repeat imaging was recommended in 6-8 weeks.  She was DC to [facility].  She is here with her daughter today.  She did not come with paperwork or order sheets from the SNF.  She had significant diarrhea and vomiting yesterday.  This is now resolved.  Her BP is quite low today.  But, she is not near syncopal and has not had syncope.  She is somewhat tired.  She is significantly deconditioned.  She arrives in a wheelchair.  She is working with PT.  She is short of breath with minimal activities.  She has not had orthopnea, paroxysmal nocturnal dyspnea.  Her leg swelling seems stable.  We are not sure what her weights have been or what her BPs have been at the SNF.  She has not had any melena, hematochezia.     Past Medical History:  Diagnosis Date  . Abscess of bursa of right hip   . Acute kidney failure ([place])   . Alcohol withdrawal ([place])   . Asthma   . Cellulitis of unspecified part of limb   .  Chronic tension type headache   . Depression   . Dysphagia, oropharyngeal phase   . Essential (primary) hypertension   . Hyperosmolality and/or hypernatremia   . Hypovolemic shock ([place])   . Iron deficiency anemia   . LBBB (left bundle branch block)   . Malignant neoplasm of upper lobe bronchus, left ([place])   . Metabolic encephalopathy   . MSSA bacteremia 2021  . Muscle weakness (generalized)   . Peritoneal abscess ([place])   . Pneumonia   . RA (rheumatoid  arthritis) ([place])   . Respiratory failure ([place])   . Staph infection   . UGIB (upper gastrointestinal bleed) 2021  . Unspecified protein-calorie malnutrition ([place])     Current Medications: Current Meds  Medication Sig  . Ascorbic Acid (VITAMIN C) 500 MG CAPS Take 500 mg by mouth daily.  . baclofen (LIORESAL) 10 MG tablet Take 10 mg by mouth 3 (three) times daily as needed.   . cycloSPORINE (RESTASIS) 0.05 % ophthalmic emulsion Place 1 drop into both eyes 2 (two) times daily.   . DULoxetine (CYMBALTA) 60 MG capsule Take 60 mg by mouth daily.  Marland Kitchen estradiol (CLIMARA - DOSED IN MG/24 HR) 0.0375 mg/24hr patch Place 0.0375 mg onto the skin as directed. Apply one patch to skin two times a week on sundays and wednesdays  . feeding supplement, ENSURE ENLIVE, (ENSURE ENLIVE) LIQD Take 237 mLs by mouth 2 (two) times daily between meals.  . ferrous sulfate 325 (65 FE) MG tablet Take 325 mg by mouth daily with breakfast.  . folic acid (FOLVITE) 0.5 MG tablet Take 2 tablets by mouth daily.  . furosemide (LASIX) 40 MG tablet Take 1 tablet (40 mg total) by mouth daily.  Marland Kitchen gabapentin (NEURONTIN) 600 MG tablet Take 600 mg by mouth 3 (three) times daily.  . Melatonin 5 MG TABS Take 1 tablet by mouth at bedtime.   . metoprolol succinate (TOPROL-XL) 25 MG 24 hr tablet Take 1 tablet (25 mg total) by mouth 2 (two) times daily with a meal.  . Multiple Vitamin (MULTIVITAMIN WITH MINERALS) TABS tablet Take 1 tablet by mouth daily.  . pantoprazole (PROTONIX) 40 MG tablet Take 40 mg by mouth 2 (two) times daily.   . polyethylene glycol (MIRALAX / GLYCOLAX) 17 g packet Take 17 g by mouth daily as needed for mild constipation.   . sacubitril-valsartan (ENTRESTO) 24-26 MG Take 1 tablet by mouth 2 (two) times daily.     Allergies:   Iodinated diagnostic agents, Shellfish allergy, and Shellfish-derived products   Social History   Tobacco Use  . Smoking status: Former Smoker    Packs/day: 2.00    Years: 20.00    Pack  years: 40.00    Types: Cigarettes    Quit date: 06/05/1982    Years since quitting: 37.1  . Smokeless tobacco: Never Used  Substance Use Topics  . Alcohol use: Not Currently  . Drug use: Never     Family Hx: The patient's family history includes Asthma in her father, mother, and sister; Breast cancer in her paternal aunt; CAD in her father; COPD in her father; High Cholesterol in her brother. There is no history of Colon cancer.  ROS   EKGs/Labs/Other Test Reviewed:    EKG:  EKG is not ordered today.  The ekg ordered today demonstrates n/a  Recent Labs: 06/19/2019: B Natriuretic Peptide 1,843.9 06/22/2019: TSH 10.903 06/25/2019: ALT <5; Magnesium 1.8 06/28/2019: BUN 19; Creatinine, Ser 0.53; Hemoglobin 8.7; Platelets 348; Potassium 3.8; Sodium 135  Recent Lipid Panel No results found for: CHOL, TRIG, HDL, CHOLHDL, LDLCALC, LDLDIRECT  Physical Exam:    VS:  BP (!) 68/40   Pulse 98   Ht 5\' 2"  (1.575 m)   SpO2 92%   BMI 24.27 kg/m     Wt Readings from Last 3 Encounters:  06/30/19 132 lb 11.5 oz (60.2 kg)  06/11/19 165 lb 3 oz (74.9 kg)     Constitutional:      Appearance: Not in distress. Frail.  Neck:     Thyroid: No thyromegaly.     Vascular: JVD normal.  Pulmonary:     Breath sounds: No wheezing. No rales.     Comments: Decreased breath sounds Cardiovascular:     Normal rate. Regular rhythm. Normal S1. Normal S2.     Murmurs: There is no murmur.  Edema:    Pretibial: bilateral trace edema of the pretibial area.    Ankle: bilateral 1+ edema of the ankle. Abdominal:     Palpations: Abdomen is soft.  Skin:    General: Skin is warm and dry.  Neurological:     General: No focal deficit present.     Mental Status: Alert and oriented to person, place and time.     Cranial Nerves: Cranial nerves are intact.       ASSESSMENT & PLAN:    1. Chronic systolic CHF (congestive heart failure) (HCC) Non-ischemic cardiomyopathy.  No CAD by cath.  NYHA 3.  Volume status  seems stable.  She just had labs drawn at the SNF.  I will request these.  If no BMET or CBC was done, I will ask for these to be drawn.  Her BP is low today.  This was likely caused by her GI illness from yesterday.  I did consider changing her Entresto to Losartan.  But, I think she should probably be able to remain on it for now.  Since she is hypotensive today, I have asked for it to be held for tonight and tomorrow AM.  If her SBP is > 90 tomorrow, she can resume it.  Continue current dose of Metoprolol succinate, furosemide.  She has an appt with Dr. Haroldine Laws in late May.  I will have her follow up here in 2-3 weeks with me or Dr. Harrington Challenger so that we can keep a close eye on her and adjust her medications as her BP can tolerate.  She may need amyloid work up for her cardiomyopathy (SPEP, UPEP, ?PYP or MRI).  I will leave this up to Dr. Haroldine Laws when he sees her in the CHF clinic.  2. Hypotension due to hypovolemia As note, this is due to her vomiting and diarrhea yesterday.  Hold Entresto tonight and tomorrow AM.  If SBP > 90 tomorrow, she can resume.    3. Mass of upper lobe of right lung RUL spiculated mass improved on follow up imaging.  She needs a repeat CT in 6-8 weeks.  I have encouraged her to follow up with Dr. Carlis Abbott with Pulmonology to have this done.   4. Mitral valve insufficiency, unspecified etiology Mild to mod by echocardiogram.  I cannot appreciate a murmur today.     Dispo:  Return in about 2 weeks (around 07/23/2019) for Close Follow Up, w/ Dr. Harrington Challenger, or Richardson Dopp, PA-C, in person.   Medication Adjustments/Labs and Tests Ordered: Current medicines are reviewed at length with the patient today.  Concerns regarding medicines are outlined above.  Tests Ordered: No orders of the  defined types were placed in this encounter.  Medication Changes: No orders of the defined types were placed in this encounter.   Signed, Richardson Dopp, PA-C  07/09/2019 6:01 PM    [facility] [place], [place], [place]  [address] Phone: [phone]; Fax: [phone]   Addendum 12:45 PM 07/18/2019 Received Labs from SNF. Personally reviewed and interpreted. 07/08/2019: Hgb 9.5, Creatinine 0.75, K 4.5 Renal function and potassium normal.  Hgb stable. No changes. Richardson Dopp, PA-C    07/18/2019 12:46 PM

## 2019-07-09 NOTE — Patient Instructions (Addendum)
Medication Instructions:   Your physician has recommended you make the following change in your medication:   1) Hold Entresto tonight and tomorrow morning, if systolic blood pressure is over 90 tomorrow night it is ok to restart Entresto.   *If you need a refill on your cardiac medications before your next appointment, please call your pharmacy*  Lab Work:  Please fax most recent labs, BMET and CBC to [phone]  Testing/Procedures:  None ordered today  Follow-Up: At [facility], you and your health needs are our priority.  As part of our continuing mission to provide you with exceptional heart care, we have created designated Provider Care Teams.  These Care Teams include your primary Cardiologist (physician) and Advanced Practice Providers (APPs -  Physician Assistants and Nurse Practitioners) who all work together to provide you with the care you need, when you need it.  We recommend signing up for the patient portal called "MyChart".  Sign up information is provided on this After Visit Summary.  MyChart is used to connect with patients for Virtual Visits (Telemedicine).  Patients are able to view lab/test results, encounter notes, upcoming appointments, etc.  Non-urgent messages can be sent to your provider as well.   To learn more about what you can do with MyChart, go to [email or web address].    Your next appointment:   On 07/29/19 at 1:45PM with Richardson Dopp, PA-C  Other Instructions  Please check blood pressure twice a day and weigh once a day.

## 2019-07-13 ENCOUNTER — Emergency Department: Payer: Medicare PPO

## 2019-07-13 ENCOUNTER — Encounter: Payer: Self-pay | Admitting: Emergency Medicine

## 2019-07-13 ENCOUNTER — Emergency Department
Admission: EM | Admit: 2019-07-13 | Discharge: 2019-07-13 | Disposition: A | Discharge status: Home / Self Care | Payer: Medicare PPO | Attending: Emergency Medicine | Admitting: Emergency Medicine

## 2019-07-13 ENCOUNTER — Other Ambulatory Visit: Payer: Self-pay

## 2019-07-13 DIAGNOSIS — W01198A Fall on same level from slipping, tripping and stumbling with subsequent striking against other object, initial encounter: Secondary | ICD-10-CM | POA: Insufficient documentation

## 2019-07-13 DIAGNOSIS — M069 Rheumatoid arthritis, unspecified: Secondary | ICD-10-CM | POA: Diagnosis present

## 2019-07-13 DIAGNOSIS — Y929 Unspecified place or not applicable: Secondary | ICD-10-CM | POA: Diagnosis not present

## 2019-07-13 DIAGNOSIS — I5021 Acute systolic (congestive) heart failure: Secondary | ICD-10-CM | POA: Diagnosis present

## 2019-07-13 DIAGNOSIS — Y999 Unspecified external cause status: Secondary | ICD-10-CM | POA: Insufficient documentation

## 2019-07-13 DIAGNOSIS — S3992XA Unspecified injury of lower back, initial encounter: Secondary | ICD-10-CM | POA: Diagnosis not present

## 2019-07-13 DIAGNOSIS — Y9389 Activity, other specified: Secondary | ICD-10-CM | POA: Insufficient documentation

## 2019-07-13 DIAGNOSIS — S79911A Unspecified injury of right hip, initial encounter: Secondary | ICD-10-CM | POA: Insufficient documentation

## 2019-07-13 DIAGNOSIS — W19XXXA Unspecified fall, initial encounter: Secondary | ICD-10-CM

## 2019-07-13 DIAGNOSIS — M545 Low back pain: Secondary | ICD-10-CM | POA: Insufficient documentation

## 2019-07-13 DIAGNOSIS — S0003XA Contusion of scalp, initial encounter: Secondary | ICD-10-CM | POA: Insufficient documentation

## 2019-07-13 DIAGNOSIS — R911 Solitary pulmonary nodule: Secondary | ICD-10-CM | POA: Diagnosis present

## 2019-07-13 DIAGNOSIS — S7001XA Contusion of right hip, initial encounter: Secondary | ICD-10-CM

## 2019-07-13 DIAGNOSIS — E871 Hypo-osmolality and hyponatremia: Secondary | ICD-10-CM

## 2019-07-13 DIAGNOSIS — J90 Pleural effusion, not elsewhere classified: Secondary | ICD-10-CM | POA: Diagnosis present

## 2019-07-13 DIAGNOSIS — S0990XA Unspecified injury of head, initial encounter: Secondary | ICD-10-CM | POA: Diagnosis present

## 2019-07-13 DIAGNOSIS — J9620 Acute and chronic respiratory failure, unspecified whether with hypoxia or hypercapnia: Secondary | ICD-10-CM | POA: Diagnosis present

## 2019-07-13 DIAGNOSIS — E440 Moderate protein-calorie malnutrition: Secondary | ICD-10-CM | POA: Diagnosis present

## 2019-07-13 NOTE — ED Notes (Signed)
ED Provider at bedside. 

## 2019-07-13 NOTE — ED Notes (Signed)
[facility], called for patrol

## 2019-07-13 NOTE — ED Provider Notes (Signed)
[facility] Emergency Department Provider Note   ____________________________________________    I have reviewed the triage vital signs and the nursing notes.   HISTORY  Chief Complaint Fall     HPI Samantha Keller is a 74 y.o. female with history as noted below who presents after a fall.  Patient reports this was mechanical fall, she was using her walker to go to the bathroom, lost her balance and fell backwards.  She thinks she hit her head against the door.  She complains of mild right hip pain.  Some low back pain.  Denies chest pain no shortness of breath.  No abdominal pain.  No nausea or vomiting.  No neuro deficits.  Past Medical History:  Diagnosis Date  . Abscess of bursa of right hip   . Acute kidney failure ([place])   . Alcohol withdrawal ([place])   . Asthma   . Cellulitis of unspecified part of limb   . Chronic tension type headache   . Depression   . Dysphagia, oropharyngeal phase   . Essential (primary) hypertension   . Hyperosmolality and/or hypernatremia   . Hypovolemic shock ([place])   . Iron deficiency anemia   . LBBB (left bundle branch block)   . Malignant neoplasm of upper lobe bronchus, left ([place])   . Metabolic encephalopathy   . MSSA bacteremia 2021  . Muscle weakness (generalized)   . Peritoneal abscess ([place])   . Pneumonia   . RA (rheumatoid arthritis) ([place])   . Respiratory failure ([place])   . Staph infection   . UGIB (upper gastrointestinal bleed) 2021  . Unspecified protein-calorie malnutrition [facility])     Patient Active Problem List   Diagnosis Date Noted  . Respiratory failure, acute-on-chronic ([place]) 06/27/2019  . Pressure injury of skin 06/26/2019  . Malnutrition of moderate degree 06/25/2019  . Acute systolic CHF (congestive heart failure) ([place]) 06/24/2019  . Metabolic acidosis 11/57/2620  . Protein-calorie malnutrition, mild ([place]) 06/20/2019  . Pleural effusion 06/20/2019  . Large pleural effusion 06/19/2019  .  Nodule of right lung 06/19/2019  . Anasarca 06/19/2019  . Pleural effusion, bilateral   . Bacteremia due to methicillin susceptible Staphylococcus aureus (MSSA) 05/27/2019  . Cellulitis and abscess of lower extremity 05/27/2019  . History of GI bleed 05/27/2019  . Cavitary lesion of lung 05/27/2019  . Rheumatoid arthritis ([place]) 05/27/2019  . Anemia 05/27/2019    Past Surgical History:  Procedure Laterality Date  . ABCESS DRAINAGE Bilateral 2021  . ABDOMINAL HYSTERECTOMY    . BREAST ENHANCEMENT SURGERY    . CERVICAL FUSION    . COLONOSCOPY     about 5 years ago. Been doing cologuard  . ESOPHAGOGASTRODUODENOSCOPY     [place]   . EYE SURGERY Bilateral    lens replacement  . INCISION AND DRAINAGE OF WOUND Bilateral 2021   hip, thigh  . IR THORACENTESIS ASP PLEURAL SPACE W/IMG GUIDE  06/20/2019  . LUMBAR FUSION    . RIGHT/LEFT HEART CATH AND CORONARY ANGIOGRAPHY N/A 06/27/2019   Procedure: RIGHT/LEFT HEART CATH AND CORONARY ANGIOGRAPHY;  Surgeon: Martinique, Peter M, MD;  Location: [facility];  Service: Cardiovascular;  Laterality: N/A;  . TONSILLECTOMY      Prior to Admission medications   Medication Sig Start Date End Date Taking? Authorizing Provider  Ascorbic Acid (VITAMIN C) 500 MG CAPS Take 500 mg by mouth daily.    [provider]  baclofen (LIORESAL) 10 MG tablet  Take 10 mg by mouth 3 (three) times daily as needed.     [provider]  cycloSPORINE (RESTASIS) 0.05 % ophthalmic emulsion Place 1 drop into both eyes 2 (two) times daily.     [provider]  DULoxetine (CYMBALTA) 60 MG capsule Take 60 mg by mouth daily.    [provider]  estradiol (CLIMARA - DOSED IN MG/24 HR) 0.0375 mg/24hr patch Place 0.0375 mg onto the skin as directed. Apply one patch to skin two times a week on sundays and wednesdays    [provider]  feeding supplement, ENSURE ENLIVE, (ENSURE ENLIVE) LIQD Take 237 mLs by mouth 2  (two) times daily between meals. 07/02/19   Oretha Milch D, MD  ferrous sulfate 325 (65 FE) MG tablet Take 325 mg by mouth daily with breakfast.    [provider]  folic acid (FOLVITE) 0.5 MG tablet Take 2 tablets by mouth daily.    [provider]  furosemide (LASIX) 40 MG tablet Take 1 tablet (40 mg total) by mouth daily. 07/02/19   Desiree Hane, MD  gabapentin (NEURONTIN) 600 MG tablet Take 600 mg by mouth 3 (three) times daily.    [provider]  Melatonin 5 MG TABS Take 1 tablet by mouth at bedtime.     [provider]  metoprolol succinate (TOPROL-XL) 25 MG 24 hr tablet Take 1 tablet (25 mg total) by mouth 2 (two) times daily with a meal. 07/01/19   Oretha Milch D, MD  Multiple Vitamin (MULTIVITAMIN WITH MINERALS) TABS tablet Take 1 tablet by mouth daily. 07/02/19   Desiree Hane, MD  pantoprazole (PROTONIX) 40 MG tablet Take 40 mg by mouth 2 (two) times daily.     [provider]  polyethylene glycol (MIRALAX / GLYCOLAX) 17 g packet Take 17 g by mouth daily as needed for mild constipation.     [provider]  sacubitril-valsartan (ENTRESTO) 24-26 MG Take 1 tablet by mouth 2 (two) times daily. 07/01/19   Desiree Hane, MD     Allergies Iodinated diagnostic agents, Shellfish allergy, and Shellfish-derived products  Family History  Problem Relation Age of Onset  . Asthma Mother   . Asthma Father   . COPD Father   . CAD Father   . Asthma Sister   . Breast cancer Paternal Aunt   . High Cholesterol Brother   . Colon cancer Neg Hx     Social History Social History   Tobacco Use  . Smoking status: Former Smoker    Packs/day: 2.00    Years: 20.00    Pack years: 40.00    Types: Cigarettes    Quit date: 06/05/1982    Years since quitting: 37.1  . Smokeless tobacco: Never Used  Substance Use Topics  . Alcohol use: Yes    Alcohol/week: 1.0 standard drinks    Types: 1 Glasses of wine per week    Comment: 1 glass per  night  . Drug use: Never    Review of Systems  Constitutional: No fever/chills Eyes: No visual changes.  ENT: Chronic neck pain Cardiovascular: Denies chest pain. Respiratory: Denies shortness of breath. Gastrointestinal: No abdominal pain.   Genitourinary: Negative for dysuria. Musculoskeletal: As above Skin: Negative for rash. Neurological: As above   ____________________________________________   PHYSICAL EXAM:  VITAL SIGNS: ED Triage Vitals  Enc Vitals Group     BP 07/13/19 2053 106/62     Pulse Rate 07/13/19 2053 68  Resp 07/13/19 2053 18     Temp 07/13/19 2053 98.1 F (36.7 C)     Temp Source 07/13/19 2053 Oral     SpO2 07/13/19 2053 99 %     Weight 07/13/19 2054 53.1 kg (117 lb)     Height 07/13/19 2054 1.575 m (5\' 2" )     Head Circumference --      Peak Flow --      Pain Score 07/13/19 2053 6     Pain Loc --      Pain Edu? --      Excl. in [place]? --     Constitutional: Alert and oriented. Eyes: Conjunctivae are normal.  Head: Small hematoma to the posterior scalp Nose: No swelling epistaxis Mouth/Throat: Mucous membranes are moist.   Neck:  Painless ROM, no pain with axial load, no vertebral tenderness palpation Cardiovascular: Normal rate, regular rhythm. Grossly normal heart sounds.  Good peripheral circulation.  No chest wall tenderness palpation Respiratory: Normal respiratory effort.  No retractions. Gastrointestinal: Soft and nontender. No distention.    Musculoskeletal: No lower extremity tenderness nor edema.  Warm and well perfused.  No vertebral tenderness to palpation.  Normal range of motion of both lower extremities and upper extremities.  No pain with axial load on both hips. Neurologic:  Normal speech and language. No gross focal neurologic deficits are appreciated.  Skin:  Skin is warm, dry and intact. No rash noted. Psychiatric: Mood and affect are normal. Speech and behavior are normal.  ____________________________________________    LABS (all labs ordered are listed, but only abnormal results are displayed)  Labs Reviewed - No data to display ____________________________________________  EKG  None ____________________________________________  RADIOLOGY  CT head cervical spine no acute injury X-ray femur, x-ray pelvis, x-ray lumbar spine no acute fracture ____________________________________________   PROCEDURES  Procedure(s) performed: No  Procedures   Critical Care performed: No ____________________________________________   INITIAL IMPRESSION / ASSESSMENT AND PLAN / ED COURSE  Pertinent labs & imaging results that were available during my care of the patient were reviewed by me and considered in my medical decision making (see chart for details).  Patient presents after mechanical fall.  Exam is overall reassuring.  CT imaging of the cervical spine, head negative for acute injury.  X-rays of the lumbar spine pelvis and right femur are negative for acute injury.  Patient is reassured, discussed results with family.  Appropriate for discharge at this time back to facility.    ____________________________________________   FINAL CLINICAL IMPRESSION(S) / ED DIAGNOSES  Final diagnoses:  Fall, initial encounter  Injury of head, initial encounter  Contusion of right hip, initial encounter        Note:  This document was prepared using Dragon voice recognition software and may include unintentional dictation errors.   Lavonia Drafts, MD 07/13/19 [phone]

## 2019-07-13 NOTE — ED Triage Notes (Addendum)
Patient presents to Emergency Department via [facility] from [facility] with complaints of fall while walking to toilet with walker and lost balance. At facility for PT and OT    No evidence of blood thinners on paperwork, pt has bump to back of head, more neck pain than chronic reported, lower back pain and right hip pain on palpation

## 2019-07-21 ENCOUNTER — Other Ambulatory Visit: Payer: Self-pay

## 2019-07-21 ENCOUNTER — Ambulatory Visit (INDEPENDENT_AMBULATORY_CARE_PROVIDER_SITE_OTHER): Payer: Medicare PPO | Admitting: Internal Medicine

## 2019-07-21 DIAGNOSIS — R7881 Bacteremia: Secondary | ICD-10-CM

## 2019-07-21 DIAGNOSIS — E441 Mild protein-calorie malnutrition: Secondary | ICD-10-CM | POA: Diagnosis not present

## 2019-07-21 DIAGNOSIS — I5021 Acute systolic (congestive) heart failure: Secondary | ICD-10-CM | POA: Diagnosis not present

## 2019-07-21 DIAGNOSIS — B9561 Methicillin susceptible Staphylococcus aureus infection as the cause of diseases classified elsewhere: Secondary | ICD-10-CM | POA: Diagnosis not present

## 2019-07-21 NOTE — Assessment & Plan Note (Signed)
Her MSSA bacteremia, soft tissue abscesses and cavitary pneumonia have resolved.  She can follow-up here as needed.

## 2019-07-21 NOTE — Assessment & Plan Note (Signed)
She developed protein calorie malnutrition complicating her recent acute illness.  Her appetite has improved but she is not gaining good weight yet.

## 2019-07-21 NOTE — Progress Notes (Signed)
[facility] for Infectious Disease  Patient Active Problem List   Diagnosis Date Noted  . Large pleural effusion 06/19/2019    Priority: High  . Bacteremia due to methicillin susceptible Staphylococcus aureus (MSSA) 05/27/2019    Priority: High  . Cellulitis and abscess of lower extremity 05/27/2019    Priority: High  . Cavitary lesion of lung 05/27/2019    Priority: High  . Respiratory failure, acute-on-chronic ([place]) 06/27/2019  . Pressure injury of skin 06/26/2019  . Malnutrition of moderate degree 06/25/2019  . Acute systolic CHF (congestive heart failure) ([employer]) 06/24/2019  . Metabolic acidosis 99/83/3825  . Protein-calorie malnutrition, mild ([place]) 06/20/2019  . Pleural effusion 06/20/2019  . Nodule of right lung 06/19/2019  . Anasarca 06/19/2019  . Pleural effusion, bilateral   . History of GI bleed 05/27/2019  . Rheumatoid arthritis ([place]) 05/27/2019  . Anemia 05/27/2019    Patient's Medications  New Prescriptions   No medications on file  Previous Medications   ASCORBIC ACID (VITAMIN C) 500 MG CAPS    Take 500 mg by mouth daily.   BACLOFEN (LIORESAL) 10 MG TABLET    Take 10 mg by mouth 3 (three) times daily as needed.    CYCLOSPORINE (RESTASIS) 0.05 % OPHTHALMIC EMULSION    Place 1 drop into both eyes 2 (two) times daily.    DULOXETINE (CYMBALTA) 60 MG CAPSULE    Take 60 mg by mouth daily.   ESTRADIOL (CLIMARA - DOSED IN MG/24 HR) 0.0375 MG/24HR PATCH    Place 0.0375 mg onto the skin as directed. Apply one patch to skin two times a week on sundays and wednesdays   FEEDING SUPPLEMENT, ENSURE ENLIVE, (ENSURE ENLIVE) LIQD    Take 237 mLs by mouth 2 (two) times daily between meals.   FERROUS SULFATE 325 (65 FE) MG TABLET    Take 325 mg by mouth daily with breakfast.   FOLIC ACID (FOLVITE) 0.5 MG TABLET    Take 2 tablets by mouth daily.   FUROSEMIDE (LASIX) 40 MG TABLET    Take 1 tablet (40 mg total) by mouth daily.   GABAPENTIN (NEURONTIN) 600 MG TABLET     Take 600 mg by mouth 3 (three) times daily.   MELATONIN 5 MG TABS    Take 1 tablet by mouth at bedtime.    METOPROLOL SUCCINATE (TOPROL-XL) 25 MG 24 HR TABLET    Take 1 tablet (25 mg total) by mouth 2 (two) times daily with a meal.   MULTIPLE VITAMIN (MULTIVITAMIN WITH MINERALS) TABS TABLET    Take 1 tablet by mouth daily.   PANTOPRAZOLE (PROTONIX) 40 MG TABLET    Take 40 mg by mouth 2 (two) times daily.    POLYETHYLENE GLYCOL (MIRALAX / GLYCOLAX) 17 G PACKET    Take 17 g by mouth daily as needed for mild constipation.    SACUBITRIL-VALSARTAN (ENTRESTO) 24-26 MG    Take 1 tablet by mouth 2 (two) times daily.  Modified Medications   No medications on file  Discontinued Medications   No medications on file    Subjective: Ms. Wilcher is in with her daughters for her hospital follow-up visit.  She was hospitalized earlier this year with MSSA bacteremia, soft tissue abscesses and right upper lobe pneumonia.  She was treated with a total of 64 days of IV cefazolin, completing therapy on 06/25/2019.  Repeat CT scan showed that the cavitary pneumonia in the right upper lobe had resolved and only left  a residual small spiculated nodule.  She was rehospitalized here in late March with heart failure and volume overload.  She underwent left thoracentesis which showed transudate of fluid.  She has not had any fever, chills or sweats.  He does not have any cough or shortness of breath.  She does not have skills to weigh herself at [facility] and is not being weighed daily to determine if she is retaining fluid again.  She is making slow progress with physical therapy.  She says that her appetite has improved but her weight is not changing.  Review of Systems: Review of Systems  Constitutional: Positive for weight loss. Negative for chills, diaphoresis and fever.       She has lost 50 pounds in the last 6 weeks due to diuresis.  Respiratory: Negative for cough and shortness of breath.   Cardiovascular:  Negative for chest pain.  Gastrointestinal: Negative for abdominal pain, nausea and vomiting.  Musculoskeletal: Positive for back pain.    Past Medical History:  Diagnosis Date  . Abscess of bursa of right hip   . Acute kidney failure ([place])   . Alcohol withdrawal ([place])   . Asthma   . Cellulitis of unspecified part of limb   . Chronic tension type headache   . Depression   . Dysphagia, oropharyngeal phase   . Essential (primary) hypertension   . Hyperosmolality and/or hypernatremia   . Hypovolemic shock ([place])   . Iron deficiency anemia   . LBBB (left bundle branch block)   . Malignant neoplasm of upper lobe bronchus, left ([place])   . Metabolic encephalopathy   . MSSA bacteremia 2021  . Muscle weakness (generalized)   . Peritoneal abscess ([place])   . Pneumonia   . RA (rheumatoid arthritis) ([place])   . Respiratory failure ([place])   . Staph infection   . UGIB (upper gastrointestinal bleed) 2021  . Unspecified protein-calorie malnutrition ([place])     Social History   Tobacco Use  . Smoking status: Former Smoker    Packs/day: 2.00    Years: 20.00    Pack years: 40.00    Types: Cigarettes    Quit date: 06/05/1982    Years since quitting: 37.1  . Smokeless tobacco: Never Used  Substance Use Topics  . Alcohol use: Yes    Alcohol/week: 1.0 standard drinks    Types: 1 Glasses of wine per week    Comment: 1 glass per night  . Drug use: Never    Family History  Problem Relation Age of Onset  . Asthma Mother   . Asthma Father   . COPD Father   . CAD Father   . Asthma Sister   . Breast cancer Paternal Aunt   . High Cholesterol Brother   . Colon cancer Neg Hx     Allergies  Allergen Reactions  . Iodinated Diagnostic Agents Anaphylaxis  . Shellfish Allergy Anaphylaxis  . Shellfish-Derived Products Anaphylaxis    Objective: Vitals:   07/21/19 1457  BP: 101/64  Pulse: 91  Temp: 98.5 F (36.9 C)  TempSrc: Oral   There is no height or weight on file to calculate  BMI.  Physical Exam Constitutional:      Comments: She looks much better than when I saw her in the hospital.  Cardiovascular:     Rate and Rhythm: Normal rate and regular rhythm.     Heart sounds: No murmur.  Pulmonary:     Breath sounds: Normal breath sounds.  Abdominal:  Palpations: Abdomen is soft.     Tenderness: There is no abdominal tenderness.  Musculoskeletal:     Right lower leg: Edema present.     Left lower leg: Edema present.  Skin:    Findings: No rash.  Neurological:     General: No focal deficit present.  Psychiatric:        Mood and Affect: Mood normal.     Lab Results    Problem List Items Addressed This Visit      High   Bacteremia due to methicillin susceptible Staphylococcus aureus (MSSA)    Her MSSA bacteremia, soft tissue abscesses and cavitary pneumonia have resolved.  She can follow-up here as needed.        Unprioritized   Protein-calorie malnutrition, mild ([place])    She developed protein calorie malnutrition complicating her recent acute illness.  Her appetite has improved but she is not gaining good weight yet.      Acute systolic CHF (congestive heart failure) (HCC)    Volume overload and anasarca has resolved.  I suggested that she get scales to weigh herself and monitor for fluid retention.  She has follow-up scheduled in the heart failure clinic.          Michel Bickers, MD [facility] for [place] [facility] [phone] pager   [phone] cell 07/21/2019, 3:23 PM

## 2019-07-21 NOTE — Assessment & Plan Note (Signed)
Volume overload and anasarca has resolved.  I suggested that she get scales to weigh herself and monitor for fluid retention.  She has follow-up scheduled in the heart failure clinic.

## 2019-07-22 ENCOUNTER — Ambulatory Visit: Payer: Medicare PPO | Attending: Internal Medicine

## 2019-07-22 DIAGNOSIS — Z23 Encounter for immunization: Secondary | ICD-10-CM

## 2019-07-22 NOTE — Telephone Encounter (Signed)
Please contact patient's daughters and contact the SNF to get an update. It sounds like we need to increase her Lasix.  But, I need more info first.  We need to find out what her BP has been running and what medications they are holding. What has her weight been doing the past week? Richardson Dopp, PA-C    07/22/2019 9:07 PM

## 2019-07-22 NOTE — Progress Notes (Signed)
   Covid-19 Vaccination Clinic  Name:  KEAUNDRA STEHLE    MRN: [id number] DOB: 06-16-1945  07/22/2019  Ms. Poyser was observed post Covid-19 immunization for 15 minutes without incident. She was provided with Vaccine Information Sheet and instruction to access the V-Safe system.   Ms. Rouser was instructed to call 911 with any severe reactions post vaccine: Marland Kitchen Difficulty breathing  . Swelling of face and throat  . A fast heartbeat  . A bad rash all over body  . Dizziness and weakness   Immunizations Administered    Name Date Dose VIS Date Route   Pfizer COVID-19 Vaccine 07/22/2019 11:54 AM 0.3 mL 05/21/2018 Intramuscular   Manufacturer: [place]   Lot: [id number]   [place]: [id number]

## 2019-07-23 ENCOUNTER — Ambulatory Visit: Payer: Medicare PPO | Admitting: Physician Assistant

## 2019-07-23 NOTE — Telephone Encounter (Signed)
I called the skilled nursing facility. Patients weight yesterday was 124.4, down 5% from 07/02/19. Her blood pressures have been 124/62 on 07/21/19, 118/56 on 07/22/19, and 97/53 this morning. The nurse stated that Metoprolol was held last night at 5:30 and this morning.

## 2019-07-23 NOTE — Telephone Encounter (Signed)
Please tell SNF that Entresto and Metoprolol should not be held unless her systolic BP is 90 or less. Have her take Lasix 40 mg twice daily x 2 days. Draw BMET, BNP. Richardson Dopp, PA-C    07/23/2019 5:16 PM

## 2019-07-24 ENCOUNTER — Ambulatory Visit: Payer: Medicare PPO

## 2019-07-29 ENCOUNTER — Other Ambulatory Visit: Payer: Self-pay

## 2019-07-29 ENCOUNTER — Telehealth (INDEPENDENT_AMBULATORY_CARE_PROVIDER_SITE_OTHER): Payer: Medicare PPO | Admitting: Physician Assistant

## 2019-07-29 ENCOUNTER — Telehealth: Payer: Self-pay | Admitting: *Deleted

## 2019-07-29 ENCOUNTER — Encounter: Payer: Self-pay | Admitting: Physician Assistant

## 2019-07-29 VITALS — BP 100/50 | HR 104 | Ht 62.0 in | Wt 118.0 lb

## 2019-07-29 DIAGNOSIS — I5023 Acute on chronic systolic (congestive) heart failure: Secondary | ICD-10-CM

## 2019-07-29 MED ORDER — POTASSIUM CHLORIDE CRYS ER 20 MEQ PO TBCR: 20.0000 meq | EXTENDED_RELEASE_TABLET | Freq: Every day | ORAL | 3 refills | Status: DC

## 2019-07-29 MED ORDER — FUROSEMIDE 40 MG PO TABS: 40.0000 mg | ORAL_TABLET | Freq: Two times a day (BID) | ORAL | 11 refills | Status: DC

## 2019-07-29 NOTE — Patient Instructions (Signed)
Medication Instructions:  Your physician has recommended you make the following change in your medication:  1.  INCREASE the Lasix to 40 mg twice a day 2.  START Potassium 20 meq daily   *If you need a refill on your cardiac medications before your next appointment, please call your pharmacy*   Lab Work: IN 1 WEEK:  PLEASE DRAW A BMET AND FAX RESULTS TO [place], PA-C AT [phone]  If you have labs (blood work) drawn today and your tests are completely normal, you will receive your results only by: Marland Kitchen MyChart Message (if you have MyChart) OR . A paper copy in the mail If you have any lab test that is abnormal or we need to change your treatment, we will call you to review the results.   Testing/Procedures: None ordered   Follow-Up: At [facility], you and your health needs are our priority.  As part of our continuing mission to provide you with exceptional heart care, we have created designated Provider Care Teams.  These Care Teams include your primary Cardiologist (physician) and Advanced Practice Providers (APPs -  Physician Assistants and Nurse Practitioners) who all work together to provide you with the care you need, when you need it.  We recommend signing up for the patient portal called "MyChart".  Sign up information is provided on this After Visit Summary.  MyChart is used to connect with patients for Virtual Visits (Telemedicine).  Patients are able to view lab/test results, encounter notes, upcoming appointments, etc.  Non-urgent messages can be sent to your provider as well.   To learn more about what you can do with MyChart, go to [email or web address].    Your next appointment:   Keep your follow-up appointment with Dr. Sung Amabile  The format for your next appointment:   In Person  Provider:     Other Instructions:

## 2019-07-29 NOTE — Progress Notes (Signed)
Virtual Visit via Telephone Note   This visit type was conducted due to national recommendations for restrictions regarding the COVID-19 Pandemic (e.g. social distancing) in an effort to limit this patient's exposure and mitigate transmission in our community.  Due to her co-morbid illnesses, this patient is at least at moderate risk for complications without adequate follow up.  This format is felt to be most appropriate for this patient at this time.  The patient did not have access to video technology/had technical difficulties with video requiring transitioning to audio format only (telephone).  All issues noted in this document were discussed and addressed.  No physical exam could be performed with this format.  Please refer to the patient's chart for her  consent to telehealth for [facility].   The patient was identified using 2 identifiers.  Date:  07/29/2019   ID:  Samantha Keller, Samantha Keller [id number], MRN [id number]  Patient Location: Other:  [facility] Provider Location: Office  PCP:  Nash Shearer, MD  Cardiologist:  [name], MD   Electrophysiologist:  None   Evaluation Performed:  Follow-Up Visit  Chief Complaint:  CHF  Patient Profile: Samantha Keller is a 74 y.o. female with:  Chronic systolic CHF ? Non-ischemic cardiomyopathy  ? Echocardiogram 05/2019: EF 20-25 ? Pulmonary hypertension  ? Cath 06/2019: normal coronary arteries  LBBB  Mitral regurgitation (mild to mod echocardiogram 05/2019)  Tricuspid regurgitation (mod by echo 05/2019)  L pleural effusion, s/p thoracentesis (05/2019)  Hypertension   Rheumatoid arthritis  Asthma  Chronic pain  Ex-smoker  Prolonged Admx in [place], [place] 03/2019 - HypoNa, AKI, MSSA bacteremia, RUL mass (pna vs CA), LE cellulitis, GI bleed; completed 6 weeks of antibiotics   RUL mass - possible due to resolving infection  Prior CV studies: R/L cardiac catheterization 06/27/2019 Normal coronary  arteries EF 35 LVEDP 19, mean PA 33  Echocardiogram 06/20/2019 EF 20-25, mildly reduced RV SF, moderate RVE, RVSP 52.9 mmHg, mild-moderate RAE, mild-moderate MR, moderate TR   History of Present Illness:   Samantha Keller was last seen 07/09/2019 for post hospital follow up.  She had been previously admitted in [place], [place] with sepsis in the setting of MSSA pneumonia.  She had a complicated, prolonged course.  She was DC to [facility] for rehab.  She was then admitted acute systolic CHF and L pleural effusion (s/p thoracentesis >> transudate).  An echocardiogram demonstrated EF 20-25.  Cardiac catheterization demonstrated no CAD.  Low BP limited CHF med titration.  She was DC to [facility] for rehab.  When I saw her last, her BP was significantly low.  She was surprisingly not significantly symptomatic.  She had a GI illness proceeding this and I held her Entresto x 2 doses.   She has an appt with [facility]F Clinic 08/20/2019 (Dr. Haroldine Laws).  A resident at her SNF tested + for [place].  Therefore, she is seen via Telemedicine today.    I was contacted recently by her daughter through [place].  The patient had some swelling in her legs and I adjusted her Furosemide for a couple days.  I also gave parameters to not hold her Metoprolol succinate or Entresto unless her SBP is < 90 mmHg.    Today, she notes that she has been discharged from [facility].  She is now at [facility] assisted [facility].  She remains short of breath with minimal activity.  She has not had orthopnea.  She notes her legs are significantly swollen.  Her weights appear to be stable.  She has not had chest discomfort or syncope.  She is supposed to have PT and OT see her at the assisted living facility.  This has not yet started.   Past Medical History:  Diagnosis Date   Abscess of bursa of right hip    Acute kidney failure (HCC)    Alcohol withdrawal (HCC)    Asthma    Cellulitis of unspecified part of limb    Chronic tension type  headache    Depression    Dysphagia, oropharyngeal phase    Essential (primary) hypertension    Hyperosmolality and/or hypernatremia    Hypovolemic shock (HCC)    Iron deficiency anemia    LBBB (left bundle branch block)    Malignant neoplasm of upper lobe bronchus, left (HCC)    Metabolic encephalopathy    MSSA bacteremia 2021   Muscle weakness (generalized)    Peritoneal abscess (HCC)    Pneumonia    RA (rheumatoid arthritis) (HCC)    Respiratory failure (HCC)    Staph infection    UGIB (upper gastrointestinal bleed) 2021   Unspecified protein-calorie malnutrition ([place])    Past Surgical History:  Procedure Laterality Date   ABCESS DRAINAGE Bilateral 2021   ABDOMINAL HYSTERECTOMY     BREAST ENHANCEMENT SURGERY     CERVICAL FUSION     COLONOSCOPY     about 5 years ago. Been doing cologuard   ESOPHAGOGASTRODUODENOSCOPY     [place]    EYE SURGERY Bilateral    lens replacement   INCISION AND DRAINAGE OF WOUND Bilateral 2021   hip, thigh   IR THORACENTESIS ASP PLEURAL SPACE W/IMG GUIDE  06/20/2019   LUMBAR FUSION     RIGHT/LEFT HEART CATH AND CORONARY ANGIOGRAPHY N/A 06/27/2019   Procedure: RIGHT/LEFT HEART CATH AND CORONARY ANGIOGRAPHY;  Surgeon: Martinique, Peter M, MD;  Location: [facility];  Service: Cardiovascular;  Laterality: N/A;   TONSILLECTOMY       Current Meds  Medication Sig   Ascorbic Acid (VITAMIN C) 500 MG CAPS Take 500 mg by mouth daily.   cycloSPORINE (RESTASIS) 0.05 % ophthalmic emulsion Place 1 drop into both eyes 2 (two) times daily.    DULoxetine (CYMBALTA) 60 MG capsule Take 60 mg by mouth daily.   estradiol (CLIMARA - DOSED IN MG/24 HR) 0.0375 mg/24hr patch Place 0.0375 mg onto the skin as directed. Apply one patch to skin two times a week on sundays and wednesdays   ferrous sulfate 325 (65 FE) MG tablet Take 325 mg by mouth daily with breakfast.   folic acid (FOLVITE) 1 MG tablet Take 1  mg by mouth daily.   furosemide (LASIX) 40 MG tablet Take 1 tablet (40 mg total) by mouth daily.   gabapentin (NEURONTIN) 600 MG tablet Take 600 mg by mouth 3 (three) times daily.   Melatonin 5 MG TABS Take 1 tablet by mouth at bedtime.    metoprolol succinate (TOPROL-XL) 25 MG 24 hr tablet Take 1 tablet (25 mg total) by mouth 2 (two) times daily with a meal.   Multiple Vitamin (MULTIVITAMIN WITH MINERALS) TABS tablet Take 1 tablet by mouth daily.   pantoprazole (PROTONIX) 40 MG tablet Take 40 mg by mouth 2 (two) times daily.    polyethylene glycol (MIRALAX / GLYCOLAX) 17 g packet Take 17 g by mouth daily as needed for mild constipation.    sacubitril-valsartan (ENTRESTO) 24-26 MG Take 1 tablet by mouth 2 (  two) times daily.     Allergies:   Iodinated diagnostic agents, Shellfish allergy, and Shellfish-derived products   Social History   Tobacco Use   Smoking status: Former Smoker    Packs/day: 2.00    Years: 20.00    Pack years: 40.00    Types: Cigarettes    Quit date: 06/05/1982    Years since quitting: 37.1   Smokeless tobacco: Never Used  Substance Use Topics   Alcohol use: Yes    Alcohol/week: 1.0 standard drinks    Types: 1 Glasses of wine per week    Comment: 1 glass per night   Drug use: Never     Family Hx: The patient's family history includes Asthma in her father, mother, and sister; Breast cancer in her paternal aunt; CAD in her father; COPD in her father; High Cholesterol in her brother. There is no history of Colon cancer.  ROS:   Please see the history of present illness.       Labs/Other Tests and Data Reviewed:    EKG:  No ECG reviewed.  Recent Labs: 06/19/2019: B Natriuretic Peptide 1,843.9 06/22/2019: TSH 10.903 06/25/2019: ALT <5; Magnesium 1.8 06/28/2019: BUN 19; Creatinine, Ser 0.53; Hemoglobin 8.7; Platelets 348; Potassium 3.8; Sodium 135   Recent Lipid Panel No results found for: CHOL, TRIG, HDL, CHOLHDL, LDLCALC, LDLDIRECT  Wt  Readings from Last 3 Encounters:  07/29/19 118 lb (53.5 kg)  07/21/19 112 lb (50.8 kg)  07/13/19 117 lb (53.1 kg)     Objective:    Vital Signs:  BP (!) 100/50    Pulse (!) 104    Ht 5\' 2"  (1.575 m)    Wt 118 lb (53.5 kg)    SpO2 92%    BMI 21.58 kg/m    VITAL SIGNS:  reviewed GEN:  no acute distress RESPIRATORY:  No labored breathing noted NEURO:  Alert and oriented PSYCH:  normal affect  ASSESSMENT & PLAN:    1. Acute on chronic systolic CHF (congestive heart failure) (HCC) Nonischemic cardiomyopathy.  NYHA III-IIIb.  It is difficult to confirm that she is volume overloaded based upon limitations related to telemedicine.  However, based upon her current symptoms and known history, she seems to be volume overloaded.  Therefore, I recommend increasing her furosemide to 40 mg twice daily.  Add potassium 20 mEq daily.  Obtain BMET in 1 week.  She has follow-up in the advanced heart failure clinic soon.  She should keep that appointment.  Her blood pressure cannot tolerate further adjustments in her medications.  Continue current dose of metoprolol and Entresto.  Her heart rate is somewhat elevated.  Question if she may benefit from ivabradine or possibly digoxin.  I will defer this to Dr. Clayborne Dana expertise when he sees her in the heart failure clinic.    Time:   Today, I have spent 14 minutes with the patient with telehealth technology discussing the above problems.     Medication Adjustments/Labs and Tests Ordered: Current medicines are reviewed at length with the patient today.  Concerns regarding medicines are outlined above.   Tests Ordered: No orders of the defined types were placed in this encounter.   Medication Changes: No orders of the defined types were placed in this encounter.   Follow Up:  In Person CHF clinic 08/20/2019  Signed, Richardson Dopp, PA-C  07/29/2019 2:18 PM    [place]

## 2019-07-29 NOTE — Telephone Encounter (Signed)
YOUR CARDIOLOGY TEAM HAS ARRANGED FOR AN E-VISIT FOR YOUR APPOINTMENT - PLEASE REVIEW IMPORTANT INFORMATION BELOW SEVERAL DAYS PRIOR TO YOUR APPOINTMENT  Due to the recent COVID-19 pandemic, we are transitioning in-person office visits to tele-medicine visits in an effort to decrease unnecessary exposure to our patients, their families, and staff. These visits are billed to your insurance just like a normal visit is. We also encourage you to sign up for MyChart if you have not already done so. You will need a smartphone if possible. For patients that do not have this, we can still complete the visit using a regular telephone but do prefer a smartphone to enable video when possible. You may have a family member that lives with you that can help. If possible, we also ask that you have a blood pressure cuff and scale at home to measure your blood pressure, heart rate and weight prior to your scheduled appointment. Patients with clinical needs that need an in-person evaluation and testing will still be able to come to the office if absolutely necessary. If you have any questions, feel free to call our office.     YOUR PROVIDER WILL BE USING THE FOLLOWING PLATFORM TO COMPLETE YOUR VISIT:   . IF USING MYCHART - How to Download the MyChart App to Your SmartPhone   - If Apple, go to [employer] and type in MyChart in the search bar and download the app. If Android, ask patient to go to [employer] and type in [place] in the search bar and download the app. The app is free but as with any other app downloads, your phone may require you to verify saved payment information or Apple/Android password.  - You will need to then log into the app with your MyChart username and password, and select [facility] as your healthcare provider to link the account.  - When it is time for your visit, go to the MyChart app, find appointments, and click Begin Video Visit. Be sure to Select Allow for your device to access the  Microphone and Camera for your visit. You will then be connected, and your provider will be with you shortly.  **If you have any issues connecting or need assistance, please contact MyChart service desk [phone])**  **If using a computer, in order to ensure the best quality for your visit, you will need to use either of the following Internet Browsers: Insurance underwriter or [employer]**  . IF USING DOXIMITY or [email or web address].ME - The staff will give you instructions on receiving your link to join the meeting the day of your visit.      THE DAY OF YOUR APPOINTMENT  Approximately 15 minutes prior to your scheduled appointment, you will receive a telephone call from one of [facility] team - your caller ID may say "Unknown caller."  Our staff will confirm medications, vital signs for the day and any symptoms you may be experiencing. Please have this information available prior to the time of visit start. It may also be helpful for you to have a pad of paper and pen handy for any instructions given during your visit. They will also walk you through joining the smartphone meeting if this is a video visit.    CONSENT FOR TELE-HEALTH VISIT - PLEASE REVIEW  I hereby voluntarily request, consent and authorize [facility] and its employed or contracted physicians, physician assistants, nurse practitioners or other licensed health care professionals (the Practitioner), to provide me with telemedicine health care  services (the "Services") as deemed necessary by the treating Practitioner. I acknowledge and consent to receive the Services by the Practitioner via telemedicine. I understand that the telemedicine visit will involve communicating with the Practitioner through live audiovisual communication technology and the disclosure of certain medical information by electronic transmission. I acknowledge that I have been given the opportunity to request an in-person assessment or other available  alternative prior to the telemedicine visit and am voluntarily participating in the telemedicine visit.  I understand that I have the right to withhold or withdraw my consent to the use of telemedicine in the course of my care at any time, without affecting my right to future care or treatment, and that the Practitioner or I may terminate the telemedicine visit at any time. I understand that I have the right to inspect all information obtained and/or recorded in the course of the telemedicine visit and may receive copies of available information for a reasonable fee.  I understand that some of the potential risks of receiving the Services via telemedicine include:  [place] Kitchen Delay or interruption in medical evaluation due to technological equipment failure or disruption; . Information transmitted may not be sufficient (e.g. poor resolution of images) to allow for appropriate medical decision making by the Practitioner; and/or  . In rare instances, security protocols could fail, causing a breach of personal health information.  Furthermore, I acknowledge that it is my responsibility to provide information about my medical history, conditions and care that is complete and accurate to the best of my ability. I acknowledge that Practitioner's advice, recommendations, and/or decision may be based on factors not within their control, such as incomplete or inaccurate data provided by me or distortions of diagnostic images or specimens that may result from electronic transmissions. I understand that the practice of medicine is not an exact science and that Practitioner makes no warranties or guarantees regarding treatment outcomes. I acknowledge that I will receive a copy of this consent concurrently upon execution via email to the email address I last provided but may also request a printed copy by calling the office of [facility].    I understand that my insurance will be billed for this visit.   I have read or had  this consent read to me. . I understand the contents of this consent, which adequately explains the benefits and risks of the Services being provided via telemedicine.  . I have been provided ample opportunity to ask questions regarding this consent and the Services and have had my questions answered to my satisfaction. . I give my informed consent for the services to be provided through the use of telemedicine in my medical care  By participating in this telemedicine visit I agree to the above.

## 2019-08-08 DIAGNOSIS — E871 Hypo-osmolality and hyponatremia: Secondary | ICD-10-CM

## 2019-08-20 ENCOUNTER — Ambulatory Visit (HOSPITAL_COMMUNITY)
Admission: RE | Admit: 2019-08-20 | Discharge: 2019-08-20 | Disposition: A | Discharge status: Home / Self Care | Payer: Medicare PPO | Source: Ambulatory Visit | Attending: Internal Medicine | Admitting: Internal Medicine

## 2019-08-20 ENCOUNTER — Other Ambulatory Visit: Payer: Self-pay

## 2019-08-20 VITALS — BP 96/54 | HR 84 | Ht 62.0 in | Wt 112.8 lb

## 2019-08-20 DIAGNOSIS — Z872 Personal history of diseases of the skin and subcutaneous tissue: Secondary | ICD-10-CM | POA: Diagnosis not present

## 2019-08-20 DIAGNOSIS — I110 Hypertensive heart disease with heart failure: Secondary | ICD-10-CM | POA: Insufficient documentation

## 2019-08-20 DIAGNOSIS — Z8701 Personal history of pneumonia (recurrent): Secondary | ICD-10-CM | POA: Diagnosis not present

## 2019-08-20 DIAGNOSIS — I428 Other cardiomyopathies: Secondary | ICD-10-CM | POA: Insufficient documentation

## 2019-08-20 DIAGNOSIS — I5022 Chronic systolic (congestive) heart failure: Secondary | ICD-10-CM

## 2019-08-20 DIAGNOSIS — I447 Left bundle-branch block, unspecified: Secondary | ICD-10-CM | POA: Insufficient documentation

## 2019-08-20 DIAGNOSIS — F329 Major depressive disorder, single episode, unspecified: Secondary | ICD-10-CM | POA: Insufficient documentation

## 2019-08-20 DIAGNOSIS — Z8719 Personal history of other diseases of the digestive system: Secondary | ICD-10-CM | POA: Insufficient documentation

## 2019-08-20 DIAGNOSIS — G8929 Other chronic pain: Secondary | ICD-10-CM | POA: Diagnosis not present

## 2019-08-20 DIAGNOSIS — Z8249 Family history of ischemic heart disease and other diseases of the circulatory system: Secondary | ICD-10-CM | POA: Insufficient documentation

## 2019-08-20 DIAGNOSIS — J90 Pleural effusion, not elsewhere classified: Secondary | ICD-10-CM | POA: Insufficient documentation

## 2019-08-20 DIAGNOSIS — J45909 Unspecified asthma, uncomplicated: Secondary | ICD-10-CM | POA: Insufficient documentation

## 2019-08-20 DIAGNOSIS — I5023 Acute on chronic systolic (congestive) heart failure: Secondary | ICD-10-CM | POA: Diagnosis present

## 2019-08-20 DIAGNOSIS — Z87891 Personal history of nicotine dependence: Secondary | ICD-10-CM | POA: Diagnosis not present

## 2019-08-20 DIAGNOSIS — Z8619 Personal history of other infectious and parasitic diseases: Secondary | ICD-10-CM | POA: Diagnosis not present

## 2019-08-20 DIAGNOSIS — Z79899 Other long term (current) drug therapy: Secondary | ICD-10-CM | POA: Insufficient documentation

## 2019-08-20 DIAGNOSIS — M069 Rheumatoid arthritis, unspecified: Secondary | ICD-10-CM | POA: Insufficient documentation

## 2019-08-20 DIAGNOSIS — I272 Pulmonary hypertension, unspecified: Secondary | ICD-10-CM | POA: Insufficient documentation

## 2019-08-20 LAB — COMPREHENSIVE METABOLIC PANEL
ALT: 8 U/L (ref 0–44)
AST: 18 U/L (ref 15–41)
Albumin: 3.3 g/dL — ABNORMAL LOW (ref 3.5–5.0)
Alkaline Phosphatase: 55 U/L (ref 38–126)
Anion gap: 9 (ref 5–15)
BUN: 40 mg/dL — ABNORMAL HIGH (ref 8–23)
CO2: 27 mmol/L (ref 22–32)
Calcium: 9.2 mg/dL (ref 8.9–10.3)
Chloride: 99 mmol/L (ref 98–111)
Creatinine, Ser: 1.46 mg/dL — ABNORMAL HIGH (ref 0.44–1.00)
GFR calc Af Amer: 41 mL/min — ABNORMAL LOW (ref >60)
GFR calc non Af Amer: 35 mL/min — ABNORMAL LOW (ref >60)
Glucose, Bld: 106 mg/dL — ABNORMAL HIGH (ref 70–99)
Potassium: 5.3 mmol/L — ABNORMAL HIGH (ref 3.5–5.1)
Sodium: 135 mmol/L (ref 135–145)
Total Bilirubin: 0.6 mg/dL (ref 0.3–1.2)
Total Protein: 7.6 g/dL (ref 6.5–8.1)

## 2019-08-20 LAB — CBC
HCT: 30.7 % — ABNORMAL LOW (ref 36.0–46.0)
Hemoglobin: 9.6 g/dL — ABNORMAL LOW (ref 12.0–15.0)
MCH: 31.4 pg (ref 26.0–34.0)
MCHC: 31.3 g/dL (ref 30.0–36.0)
MCV: 100.3 fL — ABNORMAL HIGH (ref 80.0–100.0)
Platelets: 309 10*3/uL (ref 150–400)
RBC: 3.06 MIL/uL — ABNORMAL LOW (ref 3.87–5.11)
RDW: 13.7 % (ref 11.5–15.5)
WBC: 9.4 10*3/uL (ref 4.0–10.5)
nRBC: 0 % (ref 0.0–0.2)

## 2019-08-20 LAB — BRAIN NATRIURETIC PEPTIDE: B Natriuretic Peptide: 81.9 pg/mL (ref 0.0–100.0)

## 2019-08-20 LAB — TSH: TSH: 2.823 u[IU]/mL (ref 0.350–4.500)

## 2019-08-20 MED ORDER — DIGOXIN 125 MCG PO TABS
0.0625 mg | ORAL_TABLET | Freq: Every day | ORAL | 3 refills | Status: DC
Start: 2019-08-20 — End: 2019-09-09

## 2019-08-20 MED ORDER — FUROSEMIDE 40 MG PO TABS
ORAL_TABLET | ORAL | 6 refills | Status: DC
Start: 2019-08-20 — End: 2019-09-09

## 2019-08-20 NOTE — Progress Notes (Signed)
ADVANCED HF CLINIC CONSULT NOTE  Referring Physician: Richardson Dopp PA-C  Primary Cardiologist: Dr. Harrington Challenger  HPI:  Samantha Keller is a 74 y.o. female referred by Richardson Dopp for further evaluation of her HF.  :  Chronic systolic CHF ? Non-ischemic cardiomyopathy  ? Echocardiogram 05/2019: EF 20-25 ? Pulmonary hypertension  ? Cath 06/2019: normal coronary arteries  LBBB  Mitral regurgitation (mild to mod echocardiogram 05/2019)  Tricuspid regurgitation (mod by echo 05/2019)  L pleural effusion, s/p thoracentesis (05/2019)  Hypertension   Rheumatoid arthritis  Asthma  Chronic pain  Ex-smoker quit 1984 (smoked for 20 years x 2 ppd)  Prolonged Admx in [place], [place] 03/2019 - HypoNa, AKI, MSSA bacteremia, RUL mass (pna vs CA), LE cellulitis, GI bleed; completed 6 weeks ofantibiotics  RUL mass - possible due to resolving infection  Prior CV studies:  R/L cardiac catheterization 06/27/2019 Normal coronary arteries EF 35 LVEDP 19, mean PA 33 CI 4.1  Echocardiogram 06/20/2019 EF 20-25, mildly reduced RV SF, moderate RVE, RVSP 52.9 mmHg, mild-moderate RAE, mild-moderate MR, moderate TR  Samantha Keller was doing very well (except for RA and chronic back pain) up until the beginning of this year. She was admitted in [place], [place] with sepsis in the setting of MSSA pneumonia in 1/21.  She had a complicated, prolonged course with multiple infections, AF and GI bleeding. She was DC to [facility] for rehab.  She was then admitted to [facility] for acute systolic CHF and L pleural effusion (s/p thoracentesis >> transudate).  An echocardiogram demonstrated EF 20-25.  Cardiac catheterization demonstrated no CAD.  Low BP limited CHF med titration.  She was DC to [facility] for rehab.    She is here with her 2 daughters. Now at [facility] assisted living. Says she is getting a little better. Can get around with walker pretty good. Gets SOB easily. LE edema comes and goes. On lasix 40 bid.  Entresto 24/26 bid (was held for 1 day due to low BP). Also on Toprol 25. Says SBP always < 100. Daughters say she struggles with her memory now which is new.    Echo 06/20/19 EF 20-25% global Hk. RV moderately HK severe biatrial enlargement   FHx:   Mom with CAD/MI -> CHF Older brother with MI Older and younger sisters with MI    Review of Systems: [y] = yes, [ ]  = no   General: Weight gain [ ] ; Weight loss [ ] ; Anorexia [ ] ; Fatigue [ y]; Fever [ ] ; Chills [ ] ; Weakness [name] ]  Cardiac: Chest pain/pressure [ ] ; Resting SOB [ ] ; Exertional SOB [name] ]; Orthopnea [ ] ; Pedal Edema [name] ]; Palpitations [ ] ; Syncope [ ] ; Presyncope [ ] ; Paroxysmal nocturnal dyspnea[ ]   Pulmonary: Cough [ ] ; Wheezing[ ] ; Hemoptysis[ ] ; Sputum [ ] ; Snoring [ ]   GI: Vomiting[ ] ; Dysphagia[ ] ; Melena[ ] ; Hematochezia [ ] ; Heartburn[ ] ; Abdominal pain [ ] ; Constipation [ ] ; Diarrhea [ ] ; BRBPR [ ]   GU: Hematuria[ ] ; Dysuria [ ] ; Nocturia[ ]   Vascular: Pain in legs with walking [ ] ; Pain in feet with lying flat [ ] ; Non-healing sores [ ] ; Stroke [ ] ; TIA [ ] ; Slurred speech [ ] ;  Neuro: Headaches[ y]; Vertigo[ ] ; Seizures[ ] ; Paresthesias[ ] ;Blurred vision [ ] ; Diplopia [ ] ; Vision changes [ ]   Ortho/Skin: Arthritis [name] ]; Joint pain [name] ]; Muscle pain [ ] ; Joint swelling [ ] ; Back Pain [ ] ; Rash [ ]   Psych:  Depression[ ] ; Anxiety[ ]   Heme: Bleeding problems [ ] ; Clotting disorders [ ] ; Anemia [name] ]  Endocrine: Diabetes [ ] ; Thyroid dysfunction[ ]    Past Medical History:  Diagnosis Date  . Abscess of bursa of right hip   . Acute kidney failure ([place])   . Alcohol withdrawal ([place])   . Asthma   . Cellulitis of unspecified part of limb   . Chronic tension type headache   . Depression   . Dysphagia, oropharyngeal phase   . Essential (primary) hypertension   . Hyperosmolality and/or hypernatremia   . Hypovolemic shock ([place])   . Iron deficiency anemia   . LBBB (left bundle branch block)   . Malignant neoplasm of  upper lobe bronchus, left ([place])   . Metabolic encephalopathy   . MSSA bacteremia 2021  . Muscle weakness (generalized)   . Peritoneal abscess ([place])   . Pneumonia   . RA (rheumatoid arthritis) ([place])   . Respiratory failure ([place])   . Staph infection   . UGIB (upper gastrointestinal bleed) 2021  . Unspecified protein-calorie malnutrition ([place])     Current Outpatient Medications  Medication Sig Dispense Refill  . Ascorbic Acid (VITAMIN C) 500 MG CAPS Take 500 mg by mouth daily.    . cycloSPORINE (RESTASIS) 0.05 % ophthalmic emulsion Place 1 drop into both eyes 2 (two) times daily.     . DULoxetine (CYMBALTA) 60 MG capsule Take 60 mg by mouth daily.    Marland Kitchen estradiol (CLIMARA - DOSED IN MG/24 HR) 0.0375 mg/24hr patch Place 0.0375 mg onto the skin as directed. Apply one patch to skin two times a week on sundays and wednesdays    . ferrous sulfate 325 (65 FE) MG tablet Take 325 mg by mouth daily with breakfast.    . folic acid (FOLVITE) 1 MG tablet Take 1 mg by mouth daily.    . furosemide (LASIX) 40 MG tablet Take 1 tablet (40 mg total) by mouth 2 (two) times daily. 60 tablet 11  . gabapentin (NEURONTIN) 600 MG tablet Take 600 mg by mouth 3 (three) times daily.    . Melatonin 5 MG TABS Take 1 tablet by mouth at bedtime.     . metoprolol succinate (TOPROL-XL) 25 MG 24 hr tablet Take 1 tablet (25 mg total) by mouth 2 (two) times daily with a meal.    . Multiple Vitamin (MULTIVITAMIN WITH MINERALS) TABS tablet Take 1 tablet by mouth daily.    . pantoprazole (PROTONIX) 40 MG tablet Take 40 mg by mouth 2 (two) times daily.     . polyethylene glycol (MIRALAX / GLYCOLAX) 17 g packet Take 17 g by mouth daily as needed for mild constipation.     . potassium chloride SA (KLOR-CON) 20 MEQ tablet Take 1 tablet (20 mEq total) by mouth daily. 90 tablet 3  . sacubitril-valsartan (ENTRESTO) 24-26 MG Take 1 tablet by mouth 2 (two) times daily. 60 tablet    No current facility-administered medications for this  encounter.    Allergies  Allergen Reactions  . Iodinated Diagnostic Agents Anaphylaxis  . Shellfish Allergy Anaphylaxis  . Shellfish-Derived Products Anaphylaxis      Social History   Socioeconomic History  . Marital status: Divorced    Spouse name: Not on file  . Number of children: 2  . Years of education: Not on file  . Highest education level: Not on file  Occupational History  . Not on file  Tobacco Use  . Smoking status: Former Smoker  Packs/day: 2.00    Years: 20.00    Pack years: 40.00    Types: Cigarettes    Quit date: 06/05/1982    Years since quitting: 37.2  . Smokeless tobacco: Never Used  Substance and Sexual Activity  . Alcohol use: Yes    Alcohol/week: 1.0 standard drinks    Types: 1 Glasses of wine per week    Comment: 1 glass per night  . Drug use: Never  . Sexual activity: Not Currently  Other Topics Concern  . Not on file  Social History Narrative  . Not on file   Social Determinants of Health   Financial Resource Strain:   . Difficulty of Paying Living Expenses:   Food Insecurity:   . Worried About Charity fundraiser in the Last Year:   . Arboriculturist in the Last Year:   Transportation Needs:   . Film/video editor (Medical):   Marland Kitchen Lack of Transportation (Non-Medical):   Physical Activity:   . Days of Exercise per Week:   . Minutes of Exercise per Session:   Stress:   . Feeling of Stress :   Social Connections:   . Frequency of Communication with Friends and Family:   . Frequency of Social Gatherings with Friends and Family:   . Attends Religious Services:   . Active Member of Clubs or Organizations:   . Attends Archivist Meetings:   Marland Kitchen Marital Status:   Intimate Partner Violence:   . Fear of Current or Ex-Partner:   . Emotionally Abused:   Marland Kitchen Physically Abused:   . Sexually Abused:       Family History  Problem Relation Age of Onset  . Asthma Mother   . Asthma Father   . COPD Father   . CAD Father   .  Asthma Sister   . Breast cancer Paternal Aunt   . High Cholesterol Brother   . Colon cancer Neg Hx     Vitals:   08/20/19 1456  BP: (!) 96/54  Pulse: 84  SpO2: 98%  Weight: 51.2 kg (112 lb 12.8 oz)  Height: 5\' 2"  (1.575 m)    PHYSICAL EXAM: General:  Elderly Weak appearing. No respiratory difficulty HEENT: normal Neck: supple. nJVP 7-8. Carotids 2+ bilat; no bruits. No lymphadenopathy or thryomegaly appreciated. Cor: PMI nondisplaced. Regular rate & rhythm. No rubs, gallops or murmurs. Lungs: clear with decrease BS Abdomen: soft, nontender, nondistended. No hepatosplenomegaly. No bruits or masses. Good bowel sounds. Extremities: no cyanosis, clubbing, rash, 1-2+ edema Neuro: alert & oriented x 3, cranial nerves grossly intact. moves all 4 extremities w/o difficulty. Affect pleasant.  ECG: NSR 85 LBBB (145ms). Personally reviewed  Previous ECG from 2/21 with incomplete LBBB (QRS 144ms)   ASSESSMENT & PLAN:  1. 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 RV moderately HK severe biatrial enlargement  - Cath 4/21 normal cors. Normal hemodynamics - NYHA III due to HF and deconditioning - Volume status seems elevated on exam but ReDS only 20% - BP has been too low to titrate med - will decrease lasix to 40/20  - Continue Entresto 24/26 bid for now - Stop Toprol - Start digoxin 0.0625 - Check cMRI - Labs today - She has NICM. Exact etiology unclear. No obvious smoking gun, Does have LBBB but not sure it is wide enough or longstanding enough to cause this severe of LV dysfunction. Will proceed with MRI -  Long talk about HF etiologies and treatment - Check labs - F/u HF PharmD.Marland Kitchen Continue to wean lasix and add SGLT2i soon. Titrate GDMT as BP tolerated.  2. LBBB - see above  Total time spent 50 minutes. Over half that time spent discussing above.   Glori Bickers, MD  11:52 PM

## 2019-08-20 NOTE — Patient Instructions (Signed)
Stop Metoprolol  Start Digoxin 0.0625 mg (1/2 tab) daily  Decrease Furosemide to 40 mg (1 tab) in AM and 20 mg (1/2 tab) in afternoon  Labs done today, your results will be available in MyChart, we will contact you for abnormal readings.  Please wear your compression hose daily, place them on as soon as you get up in the morning and remove before you go to bed at night.  Your physician has requested that you have a cardiac MRI. Cardiac MRI uses a computer to create images of your heart as its beating, producing both still and moving pictures of your heart and major blood vessels. For further information please visit [email or web address] Please follow the instruction sheet given to you today for more information. ONCE APPROVED BY YOUR INSURANCE WE WILL CALL YOU TO SCHEDULE THIS  Please follow up with our heart failure pharmacist in 2-3 weeks  Your physician recommends that you schedule a follow-up appointment in: 2 months  If you have any questions or concerns before your next appointment please send [place] a message through [place] or call our office at [phone].    TO SPEAK WITH A NURSE SELECT OPTION 2, PLEASE LEAVE A MESSAGE INCLUDING: . YOUR NAME . DATE OF BIRTH . CALL BACK NUMBER . REASON FOR CALL  YOU WILL RECEIVE A CALL BACK THE SAME DAY AS LONG AS YOU CALL BEFORE 4:00 PM  At the [facility], you and your health needs are our priority. As part of our continuing mission to provide you with exceptional heart care, we have created designated Provider Care Teams. These Care Teams include your primary Cardiologist (physician) and Advanced Practice Providers (APPs- Physician Assistants and Nurse Practitioners) who all work together to provide you with the care you need, when you need it.   You may see any of the following providers on your designated Care Team at your next follow up: Marland Kitchen Dr Glori Bickers . Dr Loralie Champagne . Darrick Grinder, NP . Lyda Jester,  PA . Audry Riles, PharmD   Please be sure to bring in all your medications bottles to every appointment.

## 2019-08-20 NOTE — Progress Notes (Signed)
ReDS Vest / Clip - 08/20/19 1600      ReDS Vest / Clip   Station Marker  C    Ruler Value  27    ReDS Value Range  Low volume    ReDS Actual Value  20

## 2019-09-09 ENCOUNTER — Ambulatory Visit (HOSPITAL_COMMUNITY)
Admission: RE | Admit: 2019-09-09 | Discharge: 2019-09-09 | Disposition: A | Discharge status: Home / Self Care | Payer: Medicare PPO | Source: Ambulatory Visit | Attending: Internal Medicine | Admitting: Internal Medicine

## 2019-09-09 ENCOUNTER — Other Ambulatory Visit: Payer: Self-pay

## 2019-09-09 ENCOUNTER — Telehealth (HOSPITAL_COMMUNITY): Payer: Self-pay | Admitting: Pharmacist

## 2019-09-09 DIAGNOSIS — M549 Dorsalgia, unspecified: Secondary | ICD-10-CM | POA: Diagnosis not present

## 2019-09-09 DIAGNOSIS — I5021 Acute systolic (congestive) heart failure: Secondary | ICD-10-CM

## 2019-09-09 DIAGNOSIS — R251 Tremor, unspecified: Secondary | ICD-10-CM | POA: Diagnosis not present

## 2019-09-09 DIAGNOSIS — M25569 Pain in unspecified knee: Secondary | ICD-10-CM | POA: Insufficient documentation

## 2019-09-09 DIAGNOSIS — I428 Other cardiomyopathies: Secondary | ICD-10-CM | POA: Diagnosis not present

## 2019-09-09 DIAGNOSIS — M069 Rheumatoid arthritis, unspecified: Secondary | ICD-10-CM | POA: Diagnosis not present

## 2019-09-09 DIAGNOSIS — Z79899 Other long term (current) drug therapy: Secondary | ICD-10-CM | POA: Insufficient documentation

## 2019-09-09 DIAGNOSIS — R42 Dizziness and giddiness: Secondary | ICD-10-CM | POA: Insufficient documentation

## 2019-09-09 DIAGNOSIS — Z87891 Personal history of nicotine dependence: Secondary | ICD-10-CM | POA: Diagnosis not present

## 2019-09-09 DIAGNOSIS — I447 Left bundle-branch block, unspecified: Secondary | ICD-10-CM | POA: Diagnosis not present

## 2019-09-09 DIAGNOSIS — I5022 Chronic systolic (congestive) heart failure: Secondary | ICD-10-CM | POA: Diagnosis present

## 2019-09-09 LAB — TSH: TSH: 1.586 u[IU]/mL (ref 0.350–4.500)

## 2019-09-09 LAB — BASIC METABOLIC PANEL
Anion gap: 8 (ref 5–15)
BUN: 38 mg/dL — ABNORMAL HIGH (ref 8–23)
CO2: 29 mmol/L (ref 22–32)
Calcium: 8.9 mg/dL (ref 8.9–10.3)
Chloride: 102 mmol/L (ref 98–111)
Creatinine, Ser: 1.11 mg/dL — ABNORMAL HIGH (ref 0.44–1.00)
GFR calc Af Amer: 57 mL/min — ABNORMAL LOW (ref >60)
GFR calc non Af Amer: 49 mL/min — ABNORMAL LOW (ref >60)
Glucose, Bld: 120 mg/dL — ABNORMAL HIGH (ref 70–99)
Potassium: 4.4 mmol/L (ref 3.5–5.1)
Sodium: 139 mmol/L (ref 135–145)

## 2019-09-09 LAB — T4, FREE: Free T4: 1.59 ng/dL — ABNORMAL HIGH (ref 0.61–1.12)

## 2019-09-09 LAB — BRAIN NATRIURETIC PEPTIDE: B Natriuretic Peptide: 75.1 pg/mL (ref 0.0–100.0)

## 2019-09-09 MED ORDER — SACUBITRIL-VALSARTAN 24-26 MG PO TABS: 1.0000 | ORAL_TABLET | Freq: Two times a day (BID) | ORAL | 11 refills | Status: DC

## 2019-09-09 MED ORDER — FUROSEMIDE 40 MG PO TABS: 40.0000 mg | ORAL_TABLET | Freq: Every morning | ORAL | 2 refills | Status: DC

## 2019-09-09 MED ORDER — DIGOXIN 125 MCG PO TABS: 0.0625 mg | ORAL_TABLET | Freq: Every day | ORAL | 11 refills | Status: DC

## 2019-09-09 NOTE — Telephone Encounter (Signed)
Spoke to Ms. Atkin's daughter regarding the BMET, BNP, and thyroid function tests. BMET is improved from last visit with renal function and potassium improving. BNP remains low. Free T4 is elevated. Patient's daughter instructed to reduce furosemide to 40 mg daily, stop daily potassium supplementation (but keep on hand in case needed in future), and stop levothyroxine 25 mcg daily.

## 2019-09-09 NOTE — Patient Instructions (Signed)
It was a pleasure seeing you today!  MEDICATIONS: -We will call you if after your labs result. If labs are stable, then we will decide if we would like to reduce the lasix to 40 mg (1 tablet) daily and/or stop the potassium chloride. -Stop taking levothyroxine (thyroid) medication. We will call you if we want you to resume taking -Start taking Co-enzyme Q-10 to help with hair loss. - We will call to inform you on the safety of using methotrexate and hydroxychloroquine in patients with heart failure.  -Call if you have questions about your medications.  LABS: -We will call you if your labs need attention.  NEXT APPOINTMENT: Return to clinic in 6 weeks with Dr. Haroldine Laws. Do not take digoxin in the morning of visit so we can obtain a digoxin level  In general, to take care of your heart failure: -Limit your fluid intake to 2 Liters (half-gallon) per day.   -Limit your salt intake to ideally 2-3 grams (2000-3000 mg) per day. -Weigh yourself daily and record, and bring that "weight diary" to your next appointment.  (Weight gain of 2-3 pounds in 1 day typically means fluid weight.) -The medications for your heart are to help your heart and help you live longer.   -Please contact us before stopping any of your heart medications.  Call the clinic at [phone] with questions or to reschedule future appointments.

## 2019-09-09 NOTE — Progress Notes (Addendum)
PCP: Dr. Carlean Purl Primary Cardiologist: Dr. Harrington Challenger Heart Failure: Dr. Haroldine Laws  HPI: 74 y.o. female with history of RA, asthma, chronic pain, former tobacco, and recent complex admission for MSSA bacteremia in [place], [place]. Per notes, she had a complicated hospitalization beginning in late January when she presented with fevers, confusion, and hypoxia, and was found to be hyponatremic with AKI, right upper lobe mass concerning for pneumonia versus cancer, MSSA bacteremia, lower extremity cellulitis with abscess, and upper GI bleed.She had TEE negative for vegetation, had vegetation at the tip of her central line, underwent I&D of abscess, and was eventually discharged to a local SNF with a PICC for long course of cefazolin.She has since established with local GI, pulmonology, and ID, and had an outpatient CT chest performed with findings showing large bilateral pleural effusions, complete collapse of left lower lobe, partial collapse of the lingula, and spiculated nodule in the right upper lobe.She was instructed to present to the ED and was found to have newly recognized cardiomyopathy with EF 20-25%. [facility] 06/27/19 showed normal coronaries, EF 35%, mildly elevated LV filling pressures, mild pulmonary HTN and good cardiac output. Medical management was challenging by intermittent low BP. She has been referred to heart failure clinic by [facility].  Patient was last seen by Dr. Haroldine Laws 08/20/19, where she presented with her 2 daughters. Ms. Meineke is now at at [facility] assisted living. Says stated that she was improving a bit. She was able to get around with a walker well but still was experiencing significant short of breath with minimal activity. Lower extremity edema comes and goes.    Today she returns to HF clinic for pharmacist medication titration. At last visit with MD, furosemide was decreased from 40 mg BID to 40 mg in the morning, 20 mg in the evening. Additionally, metoprolol succinate  was stopped and digoxin was initiated. Today she presents to clinic with her daughters. She is no longer at a SNF and has moved back home. She is able to perform all ADLs. Biggest complaint is rheumatoid arthritis pain in the knee, back, and legs. She has been instructed to avoid NSAIDs and take acetaminophen instead. She is awaiting referral to a rheumatologist. She was on Humira in the past but was stopped due to heart failure. She also has complained of left sided upper breast and upper back pain that is persistent and not brought on by exertion. This pain is stable and not new. She will follow-up with PCP for this pain. Not felt to be cardiac in nature. She also reports new-onset tremors. She has started taking levothyroxine 25 mcg daily for hair loss, however her last T4 prior to starting levothyroxine was normal. Otherwise, she is feeling well with no increase in shortness of breath or dyspnea unless she performs extensive activity such as walking up a hill. She has 2 pillow orthopnea, which is stable. Swelling is off and on with intermittent use of compression stockings. Her weight is stable at 113.6 lbs.  Patient reports some dizziness and lightheadedness but only with extensive physical activity. Home blood pressure readings are low at 80-100/40-50s per daughters. Denies increase in fatigue, central chest pain, or palpitations.  . Shortness of breath/dyspnea on exertion? no  . Orthopnea/PND? Stable 2 pillow orthopnea . Edema? no . Lightheadedness/dizziness? Yes - Only with moderate physical activity. None at rest . Daily weights at home? yes . Blood pressure/heart rate monitoring at home? yes . Following low-sodium/fluid-restricted diet? yes  HF Medications: - Entresto 24/26  mg BID - Digoxin 0.0625 mg daily - Furosemide 40 mg in the morning, 20 mg in the evening - Potassium chloride 20 mEq daily  Has the patient been experiencing any side effects to the medications prescribed?  Yes- having  tremors, which could be due to levothyroxine or digoxin. Levothyroxine more likely to be cause as patient's thyroid studies were WNLs prior to starting medication. Free T4 is now elevated at 1.59. She is on very low dose of digoxin and does not have any other symptoms of digoxin toxicity such as nausea, vomiting, or changes in vision.  Does the patient have any problems obtaining medications due to transportation or finances?  No issues, patient's daughters help with obtaining medication. Patient has [employer].  Understanding of regimen: excellent Understanding of indications: excellent Potential of compliance: excellent Patient understands to avoid NSAIDs. Patient understands to avoid decongestants.    Pertinent Lab Values Serum creatinine 1.11 (decreased from 1.46), BUN 28, Potassium 4.4, Sodium 139, BNP 75.1 pg/mL, TSH 1.586, Free T4 1.59  Vital Signs: . Weight: 113 lbs (last clinic weight: 112 lbs) . Blood pressure: 90/60 . Heart rate: 80  Assessment: 1. Chronic systolic HF due to NICM - Onset presumed 2/21 - Echo 06/20/19 EF 20-25% global Hypokenisis. RV with moderate hypokinesis with severe biatrial enlargement  - Cath 4/21 showed normal coronaries and normal hemodynamics - NYHA III due to HF and deconditioning, Euvolemic-dry on exam.  -Vitals: BP 90/60, HR 80. Low BP's have limited up-titration of HF therapies. -Labs: Scr improved from 1.46 to 1.11, although still elevated from her baseline of ~0.5. K improved from 5.3 to 4.4. BUN remains elevated at 38. BNP stable at 75.1 pg/mL. - Decrease furosemide to 40 mg daily and discontinue potassium chloride supplements.  - Continue Entresto 24/26 mg BID - Continue digoxin 0.0625 mg daily, check level next visit. Patient instructed to hold morning dose before next visit. - Cardiac MRI scheduled for 09/26/19 - Will wait to initiate SGLT2-I given that Scr is still above baseline. Consider addition at next visit if labs and blood  pressure are stable. Patient's co-pay for Vania Rea is $40 and co-pay for Wilder Glade is $64. - She has NICM. Exact etiology unclear. No obvious smoking gun, Does have LBBB but unclear if it is wide enough or longstanding enough to cause this severe of LV dysfunction.   2. LBBB - see above  3. Tremors -  Tremors felt to be due to addition of levothyroxine for hair loss despite patient's normal thyroid panel. Free T4 today is elevated at 1.59. TSH is WNLs. Free T3 is in process. - Discontinue levothyroxine 25 mcg daily to help stop tremors from occurring if this is the cause.  Plan: 1) Medication changes: Based on clinical presentation, vital signs and recent labs will decrease furosemide to 40 mg daily and stop daily potassium supplementation. Stop levothyroxine 25 mcg daily.Marland Kitchen 2) Labs: BMET today showed improvement in renal function and K+. BUN remains elevated. BNP remains low. 3) Follow-up visit with Dr. Haroldine Laws in 6 weeks  Sherren Kerns, PharmD PGY1 [place] Resident  Audry Riles, PharmD, BCPS, [facility], CPP Heart Failure Clinic Pharmacist [phone]

## 2019-09-10 LAB — T3, FREE: T3, Free: 2.5 pg/mL (ref 2.0–4.4)

## 2019-09-25 ENCOUNTER — Telehealth (HOSPITAL_COMMUNITY): Payer: Self-pay | Admitting: *Deleted

## 2019-09-25 NOTE — Telephone Encounter (Signed)
Attempted to call patient regarding upcoming cardiac MRI appointment. Left message on voicemail with name and callback number  [place] RN Navigator Cardiac [facility] [phone] Office [phone] Cell

## 2019-09-26 ENCOUNTER — Other Ambulatory Visit: Payer: Self-pay

## 2019-09-26 ENCOUNTER — Ambulatory Visit (HOSPITAL_COMMUNITY)
Admission: RE | Admit: 2019-09-26 | Discharge: 2019-09-26 | Disposition: A | Discharge status: Home / Self Care | Payer: Medicare PPO | Source: Ambulatory Visit | Attending: Internal Medicine | Admitting: Internal Medicine

## 2019-09-26 DIAGNOSIS — I5022 Chronic systolic (congestive) heart failure: Secondary | ICD-10-CM

## 2019-09-26 MED ORDER — GADOBUTROL 1 MMOL/ML IV SOLN
10.0000 mL | Freq: Once | INTRAVENOUS | Status: AC | PRN
  Administered 2019-09-26: 10 mL via INTRAVENOUS

## 2019-10-03 ENCOUNTER — Telehealth (HOSPITAL_COMMUNITY): Payer: Self-pay | Admitting: *Deleted

## 2019-10-03 MED ORDER — FUROSEMIDE 40 MG PO TABS: ORAL_TABLET | ORAL | 2 refills | Status: DC

## 2019-10-03 NOTE — Telephone Encounter (Signed)
-----   Message from Jolaine Artist, MD sent at 09/28/2019  8:49 PM EDT ----- EF now normal. Doubt symptoms related to HF.   Would stop digoxin. Cut lasix back to 20 daily. (can take extra as needed)

## 2019-10-03 NOTE — Telephone Encounter (Signed)
Samantha Keller, [place]  10/03/2019 12:17 PM EDT Back to Top    Spoke with pt she is aware of results and verbalized understanding   [place], CMA  10/03/2019 11:13 AM EDT     Left message to return call   Scarlette Calico, RN  10/02/2019 4:27 PM EDT     Left message to call back    Jolaine Artist, MD  09/28/2019 8:49 PM EDT     EF now normal. Doubt symptoms related to HF.   Would stop digoxin. Cut lasix back to 20 daily. (can take extra as needed)

## 2019-10-16 ENCOUNTER — Encounter (HOSPITAL_COMMUNITY): Payer: Self-pay

## 2019-10-17 ENCOUNTER — Other Ambulatory Visit (HOSPITAL_COMMUNITY): Payer: Self-pay | Admitting: *Deleted

## 2019-10-17 MED ORDER — SACUBITRIL-VALSARTAN 24-26 MG PO TABS: 1.0000 | ORAL_TABLET | Freq: Two times a day (BID) | ORAL | 0 refills | Status: DC

## 2019-10-23 ENCOUNTER — Other Ambulatory Visit: Payer: Self-pay

## 2019-10-23 ENCOUNTER — Ambulatory Visit (HOSPITAL_COMMUNITY)
Admission: RE | Admit: 2019-10-23 | Discharge: 2019-10-23 | Disposition: A | Discharge status: Home / Self Care | Payer: Medicare PPO | Source: Ambulatory Visit | Attending: Internal Medicine | Admitting: Internal Medicine

## 2019-10-23 VITALS — BP 103/70 | HR 87 | Wt 116.6 lb

## 2019-10-23 DIAGNOSIS — R413 Other amnesia: Secondary | ICD-10-CM | POA: Diagnosis not present

## 2019-10-23 DIAGNOSIS — Z79899 Other long term (current) drug therapy: Secondary | ICD-10-CM | POA: Insufficient documentation

## 2019-10-23 DIAGNOSIS — Z8249 Family history of ischemic heart disease and other diseases of the circulatory system: Secondary | ICD-10-CM | POA: Insufficient documentation

## 2019-10-23 DIAGNOSIS — J90 Pleural effusion, not elsewhere classified: Secondary | ICD-10-CM | POA: Insufficient documentation

## 2019-10-23 DIAGNOSIS — R918 Other nonspecific abnormal finding of lung field: Secondary | ICD-10-CM | POA: Insufficient documentation

## 2019-10-23 DIAGNOSIS — Z87891 Personal history of nicotine dependence: Secondary | ICD-10-CM | POA: Diagnosis not present

## 2019-10-23 DIAGNOSIS — I340 Nonrheumatic mitral (valve) insufficiency: Secondary | ICD-10-CM | POA: Diagnosis not present

## 2019-10-23 DIAGNOSIS — I5022 Chronic systolic (congestive) heart failure: Secondary | ICD-10-CM | POA: Insufficient documentation

## 2019-10-23 DIAGNOSIS — I447 Left bundle-branch block, unspecified: Secondary | ICD-10-CM | POA: Insufficient documentation

## 2019-10-23 DIAGNOSIS — F329 Major depressive disorder, single episode, unspecified: Secondary | ICD-10-CM | POA: Diagnosis not present

## 2019-10-23 DIAGNOSIS — J45909 Unspecified asthma, uncomplicated: Secondary | ICD-10-CM | POA: Diagnosis not present

## 2019-10-23 DIAGNOSIS — G8929 Other chronic pain: Secondary | ICD-10-CM | POA: Diagnosis not present

## 2019-10-23 DIAGNOSIS — I272 Pulmonary hypertension, unspecified: Secondary | ICD-10-CM | POA: Insufficient documentation

## 2019-10-23 DIAGNOSIS — E871 Hypo-osmolality and hyponatremia: Secondary | ICD-10-CM | POA: Insufficient documentation

## 2019-10-23 DIAGNOSIS — I110 Hypertensive heart disease with heart failure: Secondary | ICD-10-CM | POA: Insufficient documentation

## 2019-10-23 DIAGNOSIS — I071 Rheumatic tricuspid insufficiency: Secondary | ICD-10-CM | POA: Insufficient documentation

## 2019-10-23 DIAGNOSIS — M069 Rheumatoid arthritis, unspecified: Secondary | ICD-10-CM | POA: Diagnosis not present

## 2019-10-23 DIAGNOSIS — I428 Other cardiomyopathies: Secondary | ICD-10-CM | POA: Insufficient documentation

## 2019-10-23 NOTE — Progress Notes (Signed)
ADVANCED HF CLINIC NOTE  Referring Physician: Richardson Dopp PA-C  Primary Cardiologist: Dr. Harrington Keller  HPI:  Samantha Keller is a 74 y.o. female referred by Samantha Keller for further evaluation of her HF.  :  Chronic systolic CHF ? Non-ischemic cardiomyopathy  ? Echocardiogram 05/2019: EF 20-25 ? Pulmonary hypertension  ? Cath 06/2019: normal coronary arteries  LBBB  Mitral regurgitation (mild to mod echocardiogram 05/2019)  Tricuspid regurgitation (mod by echo 05/2019)  L pleural effusion, s/p thoracentesis (05/2019)  Hypertension   Rheumatoid arthritis  Asthma  Chronic pain  Ex-smoker quit 1984 (smoked for 20 years x 2 ppd)  Prolonged Admx in [place], [place] 03/2019 - HypoNa, AKI, MSSA bacteremia, RUL mass (pna vs CA), LE cellulitis, GI bleed; completed 6 weeks ofantibiotics  RUL mass - possible due to resolving infection  Prior CV studies:  R/L cardiac catheterization 06/27/2019 Normal coronary arteries EF 35 LVEDP 19, mean PA 33 CI 4.1  Echocardiogram 06/20/2019 EF 20-25, mildly reduced RV SF, moderate RVE, RVSP 52.9 mmHg, mild-moderate RAE, mild-moderate MR, moderate TR  Samantha Keller was doing very well (except for RA and chronic back pain) up until the beginning of this year. She was admitted in [place], [place] with sepsis in the setting of MSSA pneumonia in 1/21.  She had a complicated, prolonged course with multiple infections, AF and GI bleeding. She was DC to [facility] for rehab.  She was then admitted to [facility] for acute systolic CHF and L pleural effusion (s/p thoracentesis >> transudate).  An echocardiogram demonstrated EF 20-25%.  Cardiac catheterization 06/27/19 demonstrated no CAD.  Low BP limited CHF med titration.  She was DC to [facility] for rehab.    I saw her in 5/21 for first time. Volume she had LE edema but REDs was 20% so lasix cut back to 40/20 and Toprol held. Digoxin started.   cMRI 09/26/19  EF 58% no LGE. RV normal   Here with her  daughter. Doing better. Living with her daughter. Walking the neighborhood. Doing Silver Sneakers. Winded when walking up hills. Occasional edema. SBP 100-110 No dizziness. Struggling with her memory and hasn't had a formal w/u yet.   Echo 06/20/19 EF 20-25% global Hk. RV moderately HK severe biatrial enlargement   FHx:   Mom with CAD/MI -> CHF Older brother with MI Older and younger sisters with MI    Past Medical History:  Diagnosis Date  . Abscess of bursa of right hip   . Acute kidney failure ([place])   . Alcohol withdrawal ([place])   . Asthma   . Cellulitis of unspecified part of limb   . Chronic tension type headache   . Depression   . Dysphagia, oropharyngeal phase   . Essential (primary) hypertension   . Hyperosmolality and/or hypernatremia   . Hypovolemic shock ([place])   . Iron deficiency anemia   . LBBB (left bundle branch block)   . Malignant neoplasm of upper lobe bronchus, left ([place])   . Metabolic encephalopathy   . MSSA bacteremia 2021  . Muscle weakness (generalized)   . Peritoneal abscess ([place])   . Pneumonia   . RA (rheumatoid arthritis) ([place])   . Respiratory failure ([place])   . Staph infection   . UGIB (upper gastrointestinal bleed) 2021  . Unspecified protein-calorie malnutrition ([place])     Current Outpatient Medications  Medication Sig Dispense Refill  . Ascorbic Acid (VITAMIN C) 500 MG CAPS Take 500 mg by mouth daily.    Marland Kitchen  baclofen (LIORESAL) 10 MG tablet Take 10 mg by mouth 2 (two) times daily as needed for muscle spasms.    . cycloSPORINE (RESTASIS) 0.05 % ophthalmic emulsion Place 1 drop into both eyes 2 (two) times daily.     . DULoxetine (CYMBALTA) 60 MG capsule Take 60 mg by mouth daily.    Marland Kitchen estradiol (CLIMARA - DOSED IN MG/24 HR) 0.0375 mg/24hr patch Place 0.0375 mg onto the skin as directed. Apply one patch to skin two times a week on sundays and wednesdays    . ferrous sulfate 325 (65 FE) MG tablet Take 325 mg by mouth daily with breakfast.    .  folic acid (FOLVITE) 1 MG tablet Take 1 mg by mouth daily.    . furosemide (LASIX) 40 MG tablet Take 20mg  daily. May take an additional tablet daily as needed. 90 tablet 2  . gabapentin (NEURONTIN) 600 MG tablet Take 600 mg by mouth 3 (three) times daily.    . Melatonin 5 MG TABS Take 1 tablet by mouth at bedtime.     . pantoprazole (PROTONIX) 40 MG tablet Take 40 mg by mouth 2 (two) times daily.     . sacubitril-valsartan (ENTRESTO) 24-26 MG Take 1 tablet by mouth 2 (two) times daily. 60 tablet 0   No current facility-administered medications for this encounter.    Allergies  Allergen Reactions  . Iodinated Diagnostic Agents Anaphylaxis  . Shellfish Allergy Anaphylaxis  . Shellfish-Derived Products Anaphylaxis      Social History   Socioeconomic History  . Marital status: Divorced    Spouse name: Not on file  . Number of children: 2  . Years of education: Not on file  . Highest education level: Not on file  Occupational History  . Not on file  Tobacco Use  . Smoking status: Former Smoker    Packs/day: 2.00    Years: 20.00    Pack years: 40.00    Types: Cigarettes    Quit date: 06/05/1982    Years since quitting: 37.4  . Smokeless tobacco: Never Used  Vaping Use  . Vaping Use: Never used  Substance and Sexual Activity  . Alcohol use: Yes    Alcohol/week: 1.0 standard drink    Types: 1 Glasses of wine per week    Comment: 1 glass per night  . Drug use: Never  . Sexual activity: Not Currently  Other Topics Concern  . Not on file  Social History Narrative  . Not on file   Social Determinants of Health   Financial Resource Strain:   . Difficulty of Paying Living Expenses:   Food Insecurity:   . Worried About Charity fundraiser in the Last Year:   . Arboriculturist in the Last Year:   Transportation Needs:   . Film/video editor (Medical):   Marland Kitchen Lack of Transportation (Non-Medical):   Physical Activity:   . Days of Exercise per Week:   . Minutes of Exercise  per Session:   Stress:   . Feeling of Stress :   Social Connections:   . Frequency of Communication with Friends and Family:   . Frequency of Social Gatherings with Friends and Family:   . Attends Religious Services:   . Active Member of Clubs or Organizations:   . Attends Archivist Meetings:   Marland Kitchen Marital Status:   Intimate Partner Violence:   . Fear of Current or Ex-Partner:   . Emotionally Abused:   Marland Kitchen Physically Abused:   .  Sexually Abused:       Family History  Problem Relation Age of Onset  . Asthma Mother   . Asthma Father   . COPD Father   . CAD Father   . Asthma Sister   . Breast cancer Paternal Aunt   . High Cholesterol Brother   . Colon cancer Neg Hx     Vitals:   10/23/19 1435  BP: 103/70  Pulse: 87  SpO2: 100%  Weight: 52.9 kg (116 lb 9.6 oz)    PHYSICAL EXAM: General:  Well appearing. No resp difficulty HEENT: normal Neck: supple. no JVD. Carotids 2+ bilat; no bruits. No lymphadenopathy or thryomegaly appreciated. Cor: PMI nondisplaced. Regular rate & rhythm. No rubs, gallops or murmurs. Lungs: clear Abdomen: soft, nontender, nondistended. No hepatosplenomegaly. No bruits or masses. Good bowel sounds. Extremities: no cyanosis, clubbing, rash, edema Neuro: alert & orientedx3, cranial nerves grossly intact. moves all 4 extremities w/o difficulty. Affect pleasant   ASSESSMENT & PLAN:  1. 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 RV moderately HK severe biatrial enlargement  - Cath 4/21 normal cors. Normal hemodynamics - cMRI 09/26/19  EF 58% no LGE. RV normal  - EF has completely recovered. Raises possibility of septic CM (LBBB much less likely) - NYHA II - Volume status ok. With normalization of EF can use lasix on prn basis  - Continue Entresto 24/26 bid  - Previously unable to tolerate b-blocker.  - Check labs today  2. LBBB - see above  3. Short-term memory loss - refer to  Neurology  EF now normal. Can graduate HF Clinic. F/u [facility]. I am happy to see again if EF drops back down.   Glori Bickers, MD  3:00 PM

## 2019-10-23 NOTE — Patient Instructions (Signed)
Congratulations!!! You have graduated the [facility], please follow up with Dr Harrington Challenger at [facility], her office will call you for an appointment

## 2019-10-29 ENCOUNTER — Other Ambulatory Visit: Payer: Self-pay

## 2019-10-29 MED ORDER — SACUBITRIL-VALSARTAN 24-26 MG PO TABS: 1.0000 | ORAL_TABLET | Freq: Two times a day (BID) | ORAL | 0 refills | Status: DC

## 2019-11-07 ENCOUNTER — Other Ambulatory Visit: Payer: Self-pay

## 2019-11-10 MED ORDER — SACUBITRIL-VALSARTAN 24-26 MG PO TABS: 1.0000 | ORAL_TABLET | Freq: Two times a day (BID) | ORAL | 0 refills | Status: DC

## 2019-12-13 NOTE — Progress Notes (Signed)
Cardiology Office Note   Date:  12/15/2019   ID:  Samantha, Keller [birth year], MRN [id number]  PCP:  [name], PA-C  Cardiologist:   [name], MD   Pt presents for follow up of CHF      History of Present Illness: Samantha Keller is a 74 y.o. adult with a history of NICM, LBBB, HTN and RA She was admitted to [facility] earlier this year. In Jan 2021 she was admitted a hosp in [place] with pneumonia/sepsis.  Complicated by Afib and GI bleeding     She was admitted to [facility] in march with CHF  Echo showed LVEF was 20 to 25%  Cardiac cath showed normal coronary arteries     Since f/v  she has been seen by Kathleen Argue and [name].  Cardiac   MRI in July 2021 showed LVEF 58% with no LGE   RVEF normal  The pt comes with her daughtertoday   She says she is starting to do more; going to the [facility] to work out.  She does note some lower extremity edema.  Daughter says her mother is taking Lasix a couple times per week.  The daughter wonders if this is too much.  The patient denies dizziness. She does note occasional chest pain with breathing short-lived.  Still has weakness on the left side.  Has some balance issues.  Note when she was in the hospital she was taken off of her RA meds (methotrexate) this has not been restarted     Current Meds  Medication Sig  . Ascorbic Acid (VITAMIN C) 500 MG CAPS Take 500 mg by mouth daily.  . baclofen (LIORESAL) 10 MG tablet Take 10 mg by mouth 2 (two) times daily as needed for muscle spasms.  . cycloSPORINE (RESTASIS) 0.05 % ophthalmic emulsion Place 1 drop into both eyes 2 (two) times daily.   . DULoxetine (CYMBALTA) 60 MG capsule Take 60 mg by mouth daily.  Marland Kitchen estradiol (CLIMARA - DOSED IN MG/24 HR) 0.0375 mg/24hr patch Place 0.0375 mg onto the skin as directed. Apply one patch to skin two times a week on sundays and wednesdays  . ferrous sulfate 325 (65 FE) MG tablet Take 325 mg by mouth daily with breakfast.  . folic acid  (FOLVITE) 1 MG tablet Take 1 mg by mouth daily.  Marland Kitchen gabapentin (NEURONTIN) 600 MG tablet Take 600 mg by mouth 3 (three) times daily.  . pantoprazole (PROTONIX) 40 MG tablet Take 40 mg by mouth 2 (two) times daily.   . sacubitril-valsartan (ENTRESTO) 24-26 MG Take 1 tablet by mouth 2 (two) times daily.     Allergies:   Iodinated diagnostic agents, Shellfish allergy, and Shellfish-derived products   Past Medical History:  Diagnosis Date  . Abscess of bursa of right hip   . Acute kidney failure ([place])   . Alcohol withdrawal ([place])   . Asthma   . Cellulitis of unspecified part of limb   . Chronic tension type headache   . Depression   . Dysphagia, oropharyngeal phase   . Essential (primary) hypertension   . Hyperosmolality and/or hypernatremia   . Hypovolemic shock ([place])   . Iron deficiency anemia   . LBBB (left bundle branch block)   . Malignant neoplasm of upper lobe bronchus, left ([place])   . Metabolic encephalopathy   . MSSA bacteremia 2021  . Muscle weakness (generalized)   . Peritoneal abscess ([place])   . Pneumonia   .  RA (rheumatoid arthritis) ([place])   . Respiratory failure ([place])   . Staph infection   . UGIB (upper gastrointestinal bleed) 2021  . Unspecified protein-calorie malnutrition ([employer])     Past Surgical History:  Procedure Laterality Date  . ABCESS DRAINAGE Bilateral 2021  . ABDOMINAL HYSTERECTOMY    . BREAST ENHANCEMENT SURGERY    . CERVICAL FUSION    . COLONOSCOPY     about 5 years ago. Been doing cologuard  . ESOPHAGOGASTRODUODENOSCOPY     [place]   . EYE SURGERY Bilateral    lens replacement  . INCISION AND DRAINAGE OF WOUND Bilateral 2021   hip, thigh  . IR THORACENTESIS ASP PLEURAL SPACE W/IMG GUIDE  06/20/2019  . LUMBAR FUSION    . RIGHT/LEFT HEART CATH AND CORONARY ANGIOGRAPHY N/A 06/27/2019   Procedure: RIGHT/LEFT HEART CATH AND CORONARY ANGIOGRAPHY;  Surgeon: Martinique, Peter M, MD;  Location: [facility];  Service:  Cardiovascular;  Laterality: N/A;  . TONSILLECTOMY       Social History:  The patient  reports that [employer] Roemmich quit smoking about 37 years ago. Evelena P. Dearden's smoking use included cigarettes. Tessi P. Siebenaler has a 40.00 pack-year smoking history. Talyia P. Tebbetts has never used smokeless tobacco. Babs Bertin. Odden reports current alcohol use of about 1.0 standard drink of alcohol per week. Nimra P. Bogacki reports that [employer] Venables does not use drugs.   Family History:  The patient's family history includes Asthma in [place]. Godshall's father, mother, and sister; Breast cancer in [place]. Gasparyan's paternal aunt; CAD in [place]. Kriesel's father; COPD in [place]. Krogh's father; High Cholesterol in [place]. Bogacz's brother.    ROS:  Please see the history of present illness. All other systems are reviewed and  Negative to the above problem except as noted.    PHYSICAL EXAM: VS:  BP 100/68 (BP Location: Right Arm, Patient Position: Sitting, Cuff Size: Normal)   Pulse 72   Wt 118 lb 7.5 oz (53.7 kg)   BMI 21.67 kg/m   GEN: Thin 73 year old in no acute distress  HEENT: normal  Neck: no JVD, carotid bruits, or masses Cardiac: RRR; no murmurs, rubs, or gallops,no edema  Respiratory:  clear to auscultation bilaterally, normal work of breathing GI: soft, nontender, nondistended, + BS  No hepatomegaly  MS: no deformity Moving all extremities   Skin: warm and dry, no rash Neuro:  Strength and sensation are intact strength appears to be okay in the lower extremities Psych: euthymic mood, full affect   EKG:  EKG is not ordered today.   Lipid Panel No results found for: CHOL, TRIG, HDL, CHOLHDL, VLDL, LDLCALC, LDLDIRECT    Wt Readings from Last 3 Encounters:  12/15/19 118 lb 7.5 oz (53.7 kg)  10/23/19 116 lb 9.6 oz (52.9 kg)  09/09/19 113 lb 9.6 oz (51.5 kg)      ASSESSMENT AND PLAN:  1.  Systolic congestive heart failure.  Patient admitted earlier this year with CHF Echo  showed LVEF severely depresed at 65 to 25%  Cardiac catheterization showed normal coronary arteries.  She was placed on medical therapy.  MRI in July showed normalization of LV function.  On exam, there volume status looks good.  She denies dizziness.  Says her blood pressure at home can be in the 110s).  I am not convinced she needs extra Lasix 2x per week.  I encouraged her to take 1 time per  week.  Would continue Entresto.  If her blood pressure drops further could try a very low-dose ARB.   2  HTN  BP is contrlled  3   LBBB  4 .  Rheumatoid arthritis we will need to review her meds she has an appointment in December with rheum [facility])  5  Deconditioning, weakness, balance.  I gave pt name for Dr. Charlann Boxer to consider for evaluation, strengthening.  Plan for follow-up in February.  We will check CBC and be met today   Current medicines are reviewed at length with the patient today.  The patient does not have concerns regarding medicines.  Signed, [name], MD  12/15/2019 8:55 AM    [address], [place], [place]  [address] Phone: [phone]; Fax: [phone]

## 2019-12-15 ENCOUNTER — Ambulatory Visit (INDEPENDENT_AMBULATORY_CARE_PROVIDER_SITE_OTHER): Payer: Medicare PPO | Admitting: Internal Medicine

## 2019-12-15 ENCOUNTER — Other Ambulatory Visit: Payer: Self-pay

## 2019-12-15 ENCOUNTER — Telehealth: Payer: Self-pay | Admitting: Internal Medicine

## 2019-12-15 VITALS — BP 100/68 | HR 72 | Wt 118.5 lb

## 2019-12-15 DIAGNOSIS — I5022 Chronic systolic (congestive) heart failure: Secondary | ICD-10-CM

## 2019-12-15 LAB — CBC
Hematocrit: 33 % — ABNORMAL LOW (ref 34.0–46.6)
Hemoglobin: 10.9 g/dL — ABNORMAL LOW (ref 11.1–15.9)
MCH: 31.9 pg (ref 26.6–33.0)
MCHC: 33 g/dL (ref 31.5–35.7)
MCV: 97 fL (ref 79–97)
Platelets: 144 10*3/uL — ABNORMAL LOW (ref 150–450)
RBC: 3.42 x10E6/uL — ABNORMAL LOW (ref 3.77–5.28)
RDW: 13.8 % (ref 11.7–15.4)
WBC: 5.2 10*3/uL (ref 3.4–10.8)

## 2019-12-15 LAB — BASIC METABOLIC PANEL
BUN/Creatinine Ratio: 28 (ref 12–28)
BUN: 32 mg/dL — ABNORMAL HIGH (ref 8–27)
CO2: 27 mmol/L (ref 20–29)
Calcium: 9.5 mg/dL (ref 8.7–10.3)
Chloride: 100 mmol/L (ref 96–106)
Creatinine, Ser: 1.16 mg/dL — ABNORMAL HIGH (ref 0.57–1.00)
GFR calc Af Amer: 54 mL/min/{1.73_m2} — ABNORMAL LOW (ref >59)
GFR calc non Af Amer: 47 mL/min/{1.73_m2} — ABNORMAL LOW (ref >59)
Glucose: 93 mg/dL (ref 65–99)
Potassium: 4.3 mmol/L (ref 3.5–5.2)
Sodium: 135 mmol/L (ref 134–144)

## 2019-12-15 NOTE — Telephone Encounter (Signed)
Reivewed chart. Pt has recovered from sepsis and CHF I would contact previous Rheumatologist in [place]   I think it is OK to resume meds for RA   Has appt as new pt in [place] in December

## 2019-12-15 NOTE — Patient Instructions (Signed)
Medication Instructions:  No changes *If you need a refill on your cardiac medications before your next appointment, please call your pharmacy*   Lab Work: Today: cbc, bmet If you have labs (blood work) drawn today and your tests are completely normal, you will receive your results only by: Marland Kitchen MyChart Message (if you have MyChart) OR . A paper copy in the mail If you have any lab test that is abnormal or we need to change your treatment, we will call you to review the results.   Testing/Procedures: none   Follow-Up: At [facility], you and your health needs are our priority.  As part of our continuing mission to provide you with exceptional heart care, we have created designated Provider Care Teams.  These Care Teams include your primary Cardiologist (physician) and Advanced Practice Providers (APPs -  Physician Assistants and Nurse Practitioners) who all work together to provide you with the care you need, when you need it.  Your next appointment:   5 month(s)  The format for your next appointment:   In Person  Provider:   You may see [name], MD or one of the following Advanced Practice Providers on your designated Care Team:    Richardson Dopp, PA-C  Robbie Lis, [place]    Other Instructions

## 2019-12-16 NOTE — Telephone Encounter (Signed)
Spoke with patient and informed of recommendations below to reach out to her previous Rheumatologist to discuss restarting RA medications.  She is appreciative for call.

## 2019-12-19 ENCOUNTER — Ambulatory Visit: Payer: Medicare PPO | Admitting: Primary Care

## 2019-12-23 ENCOUNTER — Other Ambulatory Visit: Payer: Self-pay

## 2019-12-23 ENCOUNTER — Ambulatory Visit (INDEPENDENT_AMBULATORY_CARE_PROVIDER_SITE_OTHER): Payer: Medicare PPO | Admitting: Primary Care

## 2019-12-23 ENCOUNTER — Encounter: Payer: Self-pay | Admitting: Primary Care

## 2019-12-23 VITALS — BP 104/60 | HR 78 | Temp 97.9°F | Ht 62.0 in | Wt 118.4 lb

## 2019-12-23 DIAGNOSIS — J984 Other disorders of lung: Secondary | ICD-10-CM | POA: Diagnosis not present

## 2019-12-23 DIAGNOSIS — Z23 Encounter for immunization: Secondary | ICD-10-CM | POA: Diagnosis not present

## 2019-12-23 DIAGNOSIS — J90 Pleural effusion, not elsewhere classified: Secondary | ICD-10-CM | POA: Diagnosis not present

## 2019-12-23 NOTE — Progress Notes (Signed)
@Patient  ID: Samantha Keller, female    DOB: 1946/01/11, 74 y.o.   MRN: [id number]  Chief Complaint  Patient presents with  . Follow-up    Pt states she has been doing okay since last visit. Pt was placed on abx for URI by PCP. Pt is still having pain in the lungs when she breathes and also has had pain in the back.    Referring provider: [name], PA-C  Synopsis: Referred in March 2021 for a lung lesion by Michel Bickers, MD.  HPI: 74 year old female, former smoker. PMH significant for RA, pleural effusion, cavitarrty lesion of lung, systolic CHF. Patient of Dr. Carlis Abbott, seen for initial consult in March 2021.   Previous [facility] pulmonary encounter: 06/05/19- [name] is a 74 year old woman with history of rheumatoid arthritis previously on anti-TNF therapy who presents for follow-up of a right upper lobe cavitary lung lesion.  2 daughters present during the visit who provided most of the history.  This lung mass was identified during hospitalization for MSSA bacteremia resulting from a skin and soft tissue infection.  Throughout the hospitalization this lesion changed and began to cavitate per reports of her daughters.  She has no previous history of malignancy other than skin cancers.  She had an aunt with breast cancer, but no other family history of malignancy.  She smoked 2 packs/day for 20 years before quitting in 1984. She has a history of childhood asthma but no symptoms throughout adulthood.  During her recent hospitalization in [place], [place] she had bilateral pneumonia, AKI, ETOH w/d, upper GI bleed with hemorrhagic shock, CLABSI, and required intubation mechanical ventilation for about 6 days.  She underwent I&D of 5 hip abscesses where the infection began.  She is currently on IV cefazolin therapy via left upper extremity PICC.  Her RA medications have all been held since her illness.  She has established care with Dr. Megan Salon from infectious disease (05/27/2019  clinic note reviewed).  She will remain on cefazolin through 06/25/2019.  She has a follow-up chest CT ordered the last week of March.  She was discharged in the hospital on 05/14/2019 to [facility] for rehab.  She has been spending most of the day in bed when she is not participating in therapy.  She continues to have significant weakness.  She was discharged from the hospital without oxygen, but was started on 2 L of supplemental oxygen during her at rehab.  She had shortness of breath prior to being started on oxygen, but feels better on oxygen.  She has a chronic cough with minimal sputum.  She was evaluated by an ENT yesterday who told her doctors that she had pooled phlegm and she should continue coughing.  She denies wheezing.  She had no shortness of breath or pulmonary symptoms prior to hospitalization.  Left upper extremity swelling-daughters report that and ultrasound was done earlier this week which confirmed no DVT around her PICC.  Cavitary lung lesion, possibly shrunken during her admission. Raises suspicision for infectious etiology related to MSSA bacterimia or possible aspiration pneumonia. Follow-up CT in late March. If mass persists without significant decrease in size or enlarges needs biopsy for path/micro.   12/23/2019- Interim hx Patient presents today for follow-up. She was hospitalization in late march for pleural effusion s/p thoracentesis. She had a follow-up CT chest in March that showed increased volume bilateral effusion when compared to May 06, 2019. Worsening anasarca in general with body wall edema. Collapse  of cavitary focus in the right upper chest corresponds to spiculated nodules. Previously measuring 3.1 x 1.8cm; on current exam this measures 1.1 x 1.1cm. Findings are more compatible with resolving infection. Follow-up suggested to ensure resolution.   Patient reports that she is having left sided pleuritic pain with inspirtation. She has a new productive cough  with yellow mucus x 1 week. Cough has improved some over the last few days. She is currently on doxycycline for a UTI. She is compliant with lasix 40mg , takes this 1-2 times a week as needed. No significant leg swelling. She clarified her hx of skin cancer was squamous cell.     TESTING/SIG EVENTS: Records from outside hospital reviewed-brought to the visit by her daughter. Complicated admission. Multiple procedures performed- intubated, b/l IJ & right femoral CVCs, Aline, thora (transudative fluid), I&D, EGD with injection of arterial bleeding, PICC, TE (normal valves, no vegetations, but had clot on end of CVC)  Cardiac testing: TTE ([facility], 04/23/19): LVEG 50%, trace MR, miltly thickened aortic valve leaflets, trace TR. TEE ([facility], 04/30/19): LVEF 55-60%, vegetation on tip of CVC in SVC, but normal valves without vegetation. Echo 06/20/19 EF 20-25% global Hk. RV moderately HK severe biatrial enlargement   Imaging: CT chest 05/06/19: 2.2cm RUL cavitating nodule, moderate b/l pleural effusions, anasarca CT chest 04/27/19: 3.1x 1.8cm masslike consolidation RUL, b/l pleural effusions  Pulmonary: Pleural fluid glucose 112, LDH 89, protein <3g. No records of cytology being performed.   Allergies  Allergen Reactions  . Iodinated Diagnostic Agents Anaphylaxis  . Shellfish Allergy Anaphylaxis  . Shellfish-Derived Products Anaphylaxis    Immunization History  Administered Date(s) Administered  . Fluad Quad(high Dose 65+) 12/23/2019  . Influenza,inj,Quad PF,6+ Mos 01/04/2018  . Influenza,inj,quad, With Preservative 12/26/2015, 12/26/2018  . Influenza-Unspecified 12/25/2013  . PFIZER SARS-COV-2 Vaccination 04/17/2019, 07/22/2019  . Pneumococcal-Unspecified 12/31/2013, 12/25/2016    Past Medical History:  Diagnosis Date  . Abscess of bursa of right hip   . Acute kidney failure ([place])   . Alcohol withdrawal ([place])   . Asthma   . Cellulitis of unspecified part of limb   . Chronic tension type  headache   . Depression   . Dysphagia, oropharyngeal phase   . Essential (primary) hypertension   . Hyperosmolality and/or hypernatremia   . Hypovolemic shock ([place])   . Iron deficiency anemia   . LBBB (left bundle branch block)   . Malignant neoplasm of upper lobe bronchus, left ([place])   . Metabolic encephalopathy   . MSSA bacteremia 2021  . Muscle weakness (generalized)   . Peritoneal abscess ([place])   . Pneumonia   . RA (rheumatoid arthritis) ([place])   . Respiratory failure ([place])   . Staph infection   . UGIB (upper gastrointestinal bleed) 2021  . Unspecified protein-calorie malnutrition ([place])     Tobacco History: Social History   Tobacco Use  Smoking Status Former Smoker  . Packs/day: 2.00  . Years: 20.00  . Pack years: 40.00  . Types: Cigarettes  . Quit date: 06/05/1982  . Years since quitting: 37.5  Smokeless Tobacco Never Used   Counseling given: Not Answered   Outpatient Medications Prior to Visit  Medication Sig Dispense Refill  . albuterol (VENTOLIN HFA) 108 (90 Base) MCG/ACT inhaler Inhale into the lungs.    . Ascorbic Acid (VITAMIN C) 500 MG CAPS Take 500 mg by mouth daily.    . baclofen (LIORESAL) 10 MG tablet Take 10 mg by mouth 2 (two) times daily as needed for muscle  spasms.    . cycloSPORINE (RESTASIS) 0.05 % ophthalmic emulsion Place 1 drop into both eyes 2 (two) times daily.     Marland Kitchen doxycycline (VIBRA-TABS) 100 MG tablet     . DULoxetine (CYMBALTA) 60 MG capsule Take 60 mg by mouth daily.    Marland Kitchen estradiol (CLIMARA - DOSED IN MG/24 HR) 0.0375 mg/24hr patch Place 0.0375 mg onto the skin as directed. Apply one patch to skin two times a week on sundays and wednesdays    . ferrous sulfate 325 (65 FE) MG tablet Take 325 mg by mouth daily with breakfast.    . folic acid (FOLVITE) 1 MG tablet Take 1 mg by mouth daily.    . furosemide (LASIX) 40 MG tablet Take 20mg  daily. May take an additional tablet daily as needed. (Patient taking differently: Takes 20 mg 2x per week)  90 tablet 2  . gabapentin (NEURONTIN) 600 MG tablet Take 600 mg by mouth 3 (three) times daily.    . pantoprazole (PROTONIX) 40 MG tablet Take 40 mg by mouth 2 (two) times daily.     . sacubitril-valsartan (ENTRESTO) 24-26 MG Take 1 tablet by mouth 2 (two) times daily. 180 tablet 0  . traZODone (DESYREL) 50 MG tablet Take 50 mg by mouth at bedtime.    . Melatonin 5 MG TABS Take 1 tablet by mouth at bedtime.  (Patient not taking: Reported on 12/15/2019)     No facility-administered medications prior to visit.    Review of Systems  Review of Systems  Constitutional: Negative for chills, fever and unexpected weight change.  Respiratory: Positive for cough. Negative for chest tightness, shortness of breath and wheezing.        Left sided pleuritic pain  Cardiovascular: Negative.  Negative for leg swelling.    Physical Exam  BP 104/60 (BP Location: Left Arm, Cuff Size: Normal)   Pulse 78   Temp 97.9 F (36.6 C) (Other (Comment)) Comment (Src): wrist  Ht 5\' 2"  (1.575 m)   Wt 118 lb 6.4 oz (53.7 kg)   SpO2 98%   BMI 21.66 kg/m  Physical Exam Constitutional:      General: She is not in acute distress.    Appearance: Normal appearance. She is not ill-appearing.  Cardiovascular:     Rate and Rhythm: Normal rate and regular rhythm.     Heart sounds: No murmur heard.   Pulmonary:     Effort: Pulmonary effort is normal.     Breath sounds: Rales present.     Comments: Faint rales left base Neurological:     General: No focal deficit present.     Mental Status: She is alert and oriented to person, place, and time. Mental status is at baseline.  Psychiatric:        Mood and Affect: Mood normal.        Behavior: Behavior normal.        Thought Content: Thought content normal.        Judgment: Judgment normal.      Lab Results:  CBC    Component Value Date/Time   WBC 5.2 12/15/2019 0903   WBC 9.4 08/20/2019 1604   RBC 3.42 (L) 12/15/2019 0903   RBC 3.06 (L) 08/20/2019 1604    HGB 10.9 (L) 12/15/2019 0903   HCT 33.0 (L) 12/15/2019 0903   PLT 144 (L) 12/15/2019 0903   MCV 97 12/15/2019 0903   MCH 31.9 12/15/2019 0903   MCH 31.4 08/20/2019 1604   MCHC 33.0 12/15/2019  0903   MCHC 31.3 08/20/2019 1604   RDW 13.8 12/15/2019 0903   LYMPHSABS 3.4 06/25/2019 0500   MONOABS 0.8 06/25/2019 0500   EOSABS 0.4 06/25/2019 0500   BASOSABS 0.0 06/25/2019 0500    BMET    Component Value Date/Time   NA 135 12/15/2019 0903   K 4.3 12/15/2019 0903   CL 100 12/15/2019 0903   CO2 27 12/15/2019 0903   GLUCOSE 93 12/15/2019 0903   GLUCOSE 120 (H) 09/09/2019 1217   BUN 32 (H) 12/15/2019 0903   CREATININE 1.16 (H) 12/15/2019 0903   CREATININE 0.62 05/27/2019 1010   CALCIUM 9.5 12/15/2019 0903   GFRNONAA 47 (L) 12/15/2019 0903   GFRAA 54 (L) 12/15/2019 0903    BNP    Component Value Date/Time   BNP 75.1 09/09/2019 1217    ProBNP No results found for: PROBNP  Imaging: No results found.   Assessment & Plan:   Cavitary lesion of lung - Hx rheumatoid arthritis, seen for initial consult in March 2021 for right upper lobe cavitary lung lesion. She had repeat CT imaging on 06/19/19 that showed decrease in size RUL cavitation measuring 1.1 x 1.1cm; previously measuring 3.1 x 1.8cm. Findings suspicious for resolving infection. Possibly related to previous MSSA or aspiration. She reports new cough and left sided pleuritic pain with inspiration. Currentl on doxycyline for UTI. Needs repeat CT chest wo contrast to ensure resolution. If not resolved may need to consider bx for path/micro. FU in 1 week televisit to review test results    Martyn Ehrich, NP 12/25/2019

## 2019-12-23 NOTE — Patient Instructions (Addendum)
Orders: - High dose influenza vaccine today  - Needs repeat CT chest wo contrast  Follow-up: - 1 week televisit to review test results

## 2019-12-24 NOTE — Assessment & Plan Note (Addendum)
-   Hx rheumatoid arthritis, seen for initial consult in March 2021 for right upper lobe cavitary lung lesion. She had repeat CT imaging on 06/19/19 that showed decrease in size RUL cavitation measuring 1.1 x 1.1cm; previously measuring 3.1 x 1.8cm. Findings suspicious for resolving infection. Possibly related to previous MSSA or aspiration. She reports new cough and left sided pleuritic pain with inspiration. Currentl on doxycyline for UTI. Needs repeat CT chest wo contrast to ensure resolution. If not resolved may need to consider bx for path/micro. FU in 1 week televisit to review test results

## 2019-12-25 ENCOUNTER — Encounter: Payer: Self-pay | Admitting: Primary Care

## 2019-12-30 ENCOUNTER — Ambulatory Visit: Payer: Medicare PPO | Admitting: Primary Care

## 2019-12-31 ENCOUNTER — Ambulatory Visit (HOSPITAL_BASED_OUTPATIENT_CLINIC_OR_DEPARTMENT_OTHER)
Admission: RE | Admit: 2019-12-31 | Discharge: 2019-12-31 | Disposition: A | Discharge status: Home / Self Care | Payer: Medicare PPO | Source: Ambulatory Visit | Attending: Primary Care | Admitting: Primary Care

## 2019-12-31 ENCOUNTER — Other Ambulatory Visit: Payer: Self-pay

## 2019-12-31 DIAGNOSIS — J90 Pleural effusion, not elsewhere classified: Secondary | ICD-10-CM | POA: Insufficient documentation

## 2019-12-31 DIAGNOSIS — J984 Other disorders of lung: Secondary | ICD-10-CM | POA: Diagnosis present

## 2019-12-31 NOTE — Progress Notes (Signed)
Virtual Visit via Telephone Note  I connected with Samantha Keller on 12/31/19 at 10:30 AM EDT by telephone and verified that I am speaking with the correct person using two identifiers.  Location: Patient: Home Provider: Office   I discussed the limitations, risks, security and privacy concerns of performing an evaluation and management service by telephone and the availability of in person appointments. I also discussed with the patient that there may be a patient responsible charge related to this service. The patient expressed understanding and agreed to proceed.  Synopsis:  Referred in March 2021 for a lung lesion by Michel Bickers, MD.  HPI: 74 year old female, former smoker. PMH significant for RA, pleural effusion, cavitarrty lesion of lung, systolic CHF. Patient of Dr. Carlis Abbott, seen for initial consult in March 2021.   Previous [facility] pulmonary encounter: 06/05/19- Dr. Carlis Abbott consult Mrs. Larranaga is a 74 year old woman with history of rheumatoid arthritis previously on anti-TNF therapy who presents for follow-up of a right upper lobe cavitary lung lesion.  2 daughters present during the visit who provided most of the history.  This lung mass was identified during hospitalization for MSSA bacteremia resulting from a skin and soft tissue infection.  Throughout the hospitalization this lesion changed and began to cavitate per reports of her daughters.  She has no previous history of malignancy other than skin cancers.  She had an aunt with breast cancer, but no other family history of malignancy.  She smoked 2 packs/day for 20 years before quitting in 1984. She has a history of childhood asthma but no symptoms throughout adulthood.  During her recent hospitalization in [place], [place] she had bilateral pneumonia, AKI, ETOH w/d, upper GI bleed with hemorrhagic shock, CLABSI, and required intubation mechanical ventilation for about 6 days.  She underwent I&D of 5 hip abscesses where the infection began.   She is currently on IV cefazolin therapy via left upper extremity PICC.  Her RA medications have all been held since her illness.  She has established care with Dr. Megan Salon from infectious disease (05/27/2019 clinic note reviewed).  She will remain on cefazolin through 06/25/2019.  She has a follow-up chest CT ordered the last week of March.  She was discharged in the hospital on 05/14/2019 to [facility] for rehab.  She has been spending most of the day in bed when she is not participating in therapy.  She continues to have significant weakness.  She was discharged from the hospital without oxygen, but was started on 2 L of supplemental oxygen during her at rehab.  She had shortness of breath prior to being started on oxygen, but feels better on oxygen.  She has a chronic cough with minimal sputum.  She was evaluated by an ENT yesterday who told her doctors that she had pooled phlegm and she should continue coughing.  She denies wheezing.  She had no shortness of breath or pulmonary symptoms prior to hospitalization.  Left upper extremity swelling-daughters report that and ultrasound was done earlier this week which confirmed no DVT around her PICC.  Cavitary lung lesion, possibly shrunken during her admission. Raises suspicision for infectious etiology related to MSSA bacterimia or possible aspiration pneumonia. Follow-up CT in late March. If mass persists without significant decrease in size or enlarges needs biopsy for path/micro.   12/23/2019 Patient presents today for follow-up. She was hospitalization in late march for pleural effusion s/p thoracentesis. She had a follow-up CT chest in March that showed increased volume bilateral effusion when  compared to May 06, 2019. Worsening anasarca in general with body wall edema. Collapse of cavitary focus in the right upper chest corresponds to spiculated nodules. Previously measuring 3.1 x 1.8cm; on current exam this measures 1.1 x 1.1cm. Findings are more  compatible with resolving infection. Follow-up suggested to ensure resolution.   Patient reports that she is having left sided pleuritic pain with inspirtation. She has a new productive cough with yellow mucus x 1 week. Cough has improved some over the last few days. She is currently on doxycycline for a UTI. She is compliant with lasix 40mg , takes this 1-2 times a week as needed. No significant leg swelling. She clarified her hx of skin cancer was squamous cell.     01/01/2020- Interim hx  Patient contacted today follow-up/televisit.  Hx rheumatoid arthritis, seen for initial consult in March 2021 for right upper lobe cavitary lung lesion. She had repeat CT imaging on 06/19/19 that showed decrease in size RUL cavitation measuring 1.1 x 1.1cm; previously measuring 3.1 x 1.8cm. Findings suspicious for resolving infection. Possibly related to previous MSSA or aspiration. She reports new cough and left sided pleuritic pain with inspiration. Currently on doxycyline for UTI. Needs repeat CT chest wo contrast to ensure resolution. If not resolved may need to consider bx for path/micro. FU in 1 week televisit to review test results   Patient contacted today to review CT imaging. She is doing well, feeling some better. She continues to have productive cough but reports that it is getting better. She still has some pain on left side with deep breathing. She does not have shortness of breath at rest but reports dyspnea when walking up hills, which is not new. CT chest 12/31/19 showed interval resolution pleural fluid. Near complete resolution of basilar atelectasis. Decrease in nodular pleuroparenchymal thickening in the RIGHT lung. No suspicious pulmonary nodules. She has never had pulmonary function testing. Former smoker. She had Cardiac MRI in July with Dr. Haroldine Laws which showe improvement of left and right ventricular function, both currently at the normal range. She is up to date with influenza vaccine.     Observations/Objective:  - Able to speak in full sentences; no overt shortness of breath, wheezing or cough   TESTING/SIG EVENTS: Records from outside hospital reviewed-brought to the visit by her daughter. Complicated admission. Multiple procedures performed- intubated, b/l IJ & right femoral CVCs, Aline, thora (transudative fluid), I&D, EGD with injection of arterial bleeding, PICC, TE (normal valves, no vegetations, but had clot on end of CVC)  Cardiac testing: TTE ([facility], 04/23/19): LVEG 50%, trace MR, miltly thickened aortic valve leaflets, trace TR. TEE ([facility], 04/30/19): LVEF 55-60%, vegetation on tip of CVC in SVC, but normal valves without vegetation. Echo 06/20/19 EF 20-25% global Hk. RV moderately HK severe biatrial enlargement  Cardiac MRI : Since the echocardiogram on 06/20/2019 there has been improvement of left and right ventricular function, both currently at the normal range. There is no evidence for infiltrative of inflammatory intramyocardial process.  Imaging: CT chest 04/27/19: 3.1x 1.8cm masslike consolidation RUL, b/l pleural effusions CT chest 05/06/19: 2.2cm RUL cavitating nodule, moderate b/l pleural effusions, anasarca CT chest 06/25/19: Collapse/cavitary focus RUL, currently measuring 1.1 x 1.1 cm CT chest 12/31/19: Interval resolution pleural fluid. Near complete resolution of basilar atelectasis. Decrease in nodular pleuroparenchymal thickening in the RIGHT lung. No suspicious pulmonary nodules.   Pulmonary: Pleural fluid glucose 112, LDH 89, protein <3g. No records of cytology being performed.   Assessment and Plan: Hx rheumatoid  arthritis, seen for initial consult in march 2021 for RUL cavitary lung lesion.S Sh was hospitalization in late March for pleural effusion s/p thoracentesis. She had a follow-up CT chest in March that showed increased volume bilateral effusion when compared to May 06, 2019. Collapse of cavitary focus in the right upper chest corresponds to  spiculated nodules. Previously measuring 3.1 x 1.8cm; on current exam this measures 1.1 x 1.1cm. Findings are more compatible with resolving infection. Continue to follow to ensure resolution.   Cavitary lesion of lung:  - Patient reports improvement in cough, completed course of Doxycycline which had been prescribed for UTI. She continues to have some left sided pleuritic pain on inspiration  - Follow-up CT imaging on 12/31/19 showed decreased nodular pleuroparenchymal thickening right lung, no suspicious pulmonary nodules. Resolution pleural effusion and near complete resolution atelectasis.  - Encourage IS 5-10x/hr while awake   Dyspnea on exertion: - Former smoker, no formal PFTs on file.  - Recommend pulmonary function testing in 8 weeks to assess for underlying restrictive or obstructive lung disease  Coronary artery disease: - Coronary artery calcification and aortic atherosclerotic calcification seen on CT imaging. She had cardiac MRI in July which showed improvement of left and right ventricular function, both currently at the normal range. There is no evidence for infiltrative of inflammatory intramyocardial process. - Following with Dr. Haroldine Laws cardiology   Follow Up Instructions:  - 6-8 week fu with new pulmonary provider with PFTs    I discussed the assessment and treatment plan with the patient. The patient was provided an opportunity to ask questions and all were answered. The patient agreed with the plan and demonstrated an understanding of the instructions.   The patient was advised to call back or seek an in-person evaluation if the symptoms worsen or if the condition fails to improve as anticipated.  I provided 35 minutes of non-face-to-face time during this encounter.   Martyn Ehrich, NP

## 2019-12-31 NOTE — Patient Instructions (Addendum)
CT chest looks a great deal better. You had resolution of pleural fluid and near complete resolution of basilar atelectasis. Decrease in nodular pleuroparenchymal thickening in the RIGHT and no suspicious pulmonary nodules. You did have coronary artery calcifications on imaging, you did have a cardiac MRI in July which appeared improved. Recommed regular follow-up with cardiology   We will order pulmonary function testing to assess shortness of breath you are experiencing on exertion. Advise you get an Incentive spirometer and take 5-10 breaths/hr while awake.   Follow-up: Dec 6th 10am PFTs and 11am visit with Dr. Pollyann Kennedy

## 2020-01-01 ENCOUNTER — Ambulatory Visit (INDEPENDENT_AMBULATORY_CARE_PROVIDER_SITE_OTHER): Payer: Medicare PPO | Admitting: Primary Care

## 2020-01-01 DIAGNOSIS — J90 Pleural effusion, not elsewhere classified: Secondary | ICD-10-CM

## 2020-02-09 MED ORDER — SACUBITRIL-VALSARTAN 24-26 MG PO TABS: 1.0000 | ORAL_TABLET | Freq: Two times a day (BID) | ORAL | 0 refills | Status: DC

## 2020-03-01 ENCOUNTER — Ambulatory Visit (INDEPENDENT_AMBULATORY_CARE_PROVIDER_SITE_OTHER): Payer: Medicare PPO | Admitting: Pulmonary Disease

## 2020-03-01 ENCOUNTER — Encounter: Payer: Self-pay | Admitting: Pulmonary Disease

## 2020-03-01 ENCOUNTER — Other Ambulatory Visit: Payer: Self-pay

## 2020-03-01 VITALS — BP 120/76 | HR 91 | Temp 98.4°F | Ht 62.0 in | Wt 120.4 lb

## 2020-03-01 DIAGNOSIS — J90 Pleural effusion, not elsewhere classified: Secondary | ICD-10-CM

## 2020-03-01 DIAGNOSIS — M059 Rheumatoid arthritis with rheumatoid factor, unspecified: Secondary | ICD-10-CM | POA: Diagnosis not present

## 2020-03-01 DIAGNOSIS — J984 Other disorders of lung: Secondary | ICD-10-CM | POA: Diagnosis not present

## 2020-03-01 DIAGNOSIS — R0602 Shortness of breath: Secondary | ICD-10-CM

## 2020-03-01 DIAGNOSIS — R942 Abnormal results of pulmonary function studies: Secondary | ICD-10-CM

## 2020-03-01 LAB — PULMONARY FUNCTION TEST
DL/VA % pred: 76 %
DL/VA: 3.2 ml/min/mmHg/L
DLCO cor % pred: 69 %
DLCO cor: 12.43 ml/min/mmHg
DLCO unc % pred: 69 %
DLCO unc: 12.43 ml/min/mmHg
FEF 25-75 Post: 1.49 L/sec
FEF 25-75 Pre: 1.25 L/sec
FEF2575-%Change-Post: 19 %
FEF2575-%Pred-Post: 90 %
FEF2575-%Pred-Pre: 76 %
FEV1-%Change-Post: 2 %
FEV1-%Pred-Post: 83 %
FEV1-%Pred-Pre: 81 %
FEV1-Post: 1.66 L
FEV1-Pre: 1.62 L
FEV1FVC-%Change-Post: 1 %
FEV1FVC-%Pred-Pre: 102 %
FEV6-%Change-Post: 0 %
FEV6-%Pred-Post: 83 %
FEV6-%Pred-Pre: 83 %
FEV6-Post: 2.09 L
FEV6-Pre: 2.1 L
FEV6FVC-%Change-Post: 0 %
FEV6FVC-%Pred-Post: 105 %
FEV6FVC-%Pred-Pre: 104 %
FVC-%Change-Post: 0 %
FVC-%Pred-Post: 80 %
FVC-%Pred-Pre: 79 %
FVC-Post: 2.13 L
FVC-Pre: 2.11 L
Post FEV1/FVC ratio: 78 %
Post FEV6/FVC ratio: 100 %
Pre FEV1/FVC ratio: 77 %
Pre FEV6/FVC Ratio: 100 %
RV % pred: 109 %
RV: 2.35 L
TLC % pred: 93 %
TLC: 4.45 L

## 2020-03-01 NOTE — Progress Notes (Signed)
PFT done today. 

## 2020-03-01 NOTE — Patient Instructions (Signed)
We will schedule you for a high resolution CT chest scan at your convenience  Follow up in 3 months

## 2020-03-01 NOTE — Progress Notes (Signed)
Synopsis: Follow up for Cavitary Lung Lesion  Subjective:   PATIENT ID: Samantha Keller GENDER: female DOB: [birth year], MRN: [id number]   HPI  Chief Complaint  Patient presents with  . Follow-up    get pft results   Samantha Keller is a 74 year old woman, former smoker with rheumatoid arthritis previously on methotrexate, hydroxychloroquine and humira who has been followed in pulmonary clinic for a cavitary lung lesion due to MSSA bacteremia and dyspnea.   She had pulmonary function testing today which shows normal spirometry, normal lung volumes and a mild reduction in her diffusion capacity.   She continues to experience exertional dyspnea and cough. The cough is mainly dry now and was previously productive.  She has not re-started her rheumatoid arthritis treatment since being hospitalized earlier this year. She has follow up with rheumatology on 03/17/20. She complains of diffuse joint pains.    Detailed history of patients hospitalization 04/22/19 - 05/14/19 in [place], [place] and follow up visits in our clinic detailed below. _____________________  06/05/19- [name] is a 74 year old woman with history of rheumatoid arthritis previously on anti-TNF therapy who presents for follow-up of a right upper lobe cavitary lung lesion. 2 daughters present during the visit who provided most of the history. This lung mass was identified during hospitalization for MSSA bacteremia resulting from a skin and soft tissue infection. Throughout the hospitalization this lesion changed and began to cavitate per reports of her daughters. She has no previous history of malignancy other than skin cancers. She had an aunt with breast cancer, but no other family history of malignancy. She smoked 2 packs/day for 20 years before quitting in 1984. She has a history of childhood asthma but no symptoms throughout adulthood.  During her recent hospitalization in [place], [place] she had bilateral  pneumonia, AKI, ETOH w/d, upper GI bleed with hemorrhagic shock, CLABSI, and required intubation mechanical ventilation for about 6 days. She underwent I&D of 5 hip abscesses where the infection began. She is currently on IV cefazolin therapy via left upper extremity PICC. Her RA medications have all been held since her illness. She has established care with Dr. Megan Salon from infectious disease (05/27/2019 clinic note reviewed). She will remain on cefazolin through 06/25/2019. She has a follow-up chest CT ordered the last week of March. She was discharged in the hospital on 05/14/2019 to [facility] for rehab. She has been spending most of the day in bed when she is not participating in therapy. She continues to have significant weakness. She was discharged from the hospital without oxygen, but was started on 2 L of supplemental oxygen during her at rehab. She had shortness of breath prior to being started on oxygen, but feels better on oxygen. She has a chronic cough with minimal sputum. She was evaluated by an ENT yesterday who told her doctors that she had pooled phlegm and she should continue coughing. She denies wheezing. She had no shortness of breath or pulmonary symptoms prior to hospitalization.  Left upper extremity swelling-daughters report that and ultrasound was done earlier this week which confirmed no DVT around her PICC.  Cavitary lung lesion, possibly shrunken during her admission. Raises suspicision for infectious etiology related to MSSA bacterimia or possible aspiration pneumonia. Follow-up CT in late March. If mass persists without significant decrease in size or enlarges needs biopsy for path/micro.   12/23/2019 Patient presents today for follow-up. She was hospitalization in late march for pleural effusion s/p thoracentesis. She had  a follow-up CT chest in March that showed increased volume bilateral effusion when compared to May 06, 2019. Worsening anasarca in general  with body wall edema. Collapse of cavitary focus in the right upper chest corresponds to spiculated nodules. Previously measuring 3.1 x 1.8cm; on current exam this measures 1.1 x 1.1cm. Findings are more compatible with resolving infection. Follow-up suggested to ensure resolution.   Patient reports that she is having left sided pleuritic pain with inspirtation. She has a new productive cough with yellow mucus x 1 week. Cough has improved some over the last few days. She is currently on doxycycline for a UTI. She is compliant with lasix 40mg , takes this 1-2 times a week as needed. No significant leg swelling. She clarified her hx of skin cancer was squamous cell.    01/01/2020- Interim hx  Patient contacted today follow-up/televisit.  Hx rheumatoid arthritis, seen for initial consult in March 2021 for right upper lobe cavitary lung lesion.She had repeat CT imaging on 06/19/19 that showed decrease in size RUL cavitation measuring 1.1 x 1.1cm; previously measuring 3.1 x 1.8cm. Findings suspicious for resolving infection.Possibly related to previous MSSA or aspiration. She reports new cough and left sided pleuritic pain with inspiration. Currently on doxycyline for UTI.Needs repeat CT chest wo contrastto ensure resolution.If not resolved may need to consider bx for path/micro. FU in1 week televisit to review test results  Patient contacted today to review CT imaging. She is doing well, feeling some better. She continues to have productive cough but reports that it is getting better. She still has some pain on left side with deep breathing. She does not have shortness of breath at rest but reports dyspnea when walking up hills, which is not new. CT chest 12/31/19 showed interval resolution pleural fluid. Near complete resolution of basilar atelectasis. Decrease in nodular pleuroparenchymal thickening in the RIGHT lung. No suspicious pulmonary nodules. She has never had pulmonary function testing. Former  smoker. She had Cardiac MRI in July with Dr. Haroldine Laws which showe improvement of left and right ventricular function, both currently at the normal range. She is up to date with influenza vaccine.   Past Medical History:  Diagnosis Date  . Abscess of bursa of right hip   . Acute kidney failure ([place])   . Alcohol withdrawal ([place])   . Asthma   . Cellulitis of unspecified part of limb   . Chronic tension type headache   . Depression   . Dysphagia, oropharyngeal phase   . Essential (primary) hypertension   . Hyperosmolality and/or hypernatremia   . Hypovolemic shock ([place])   . Iron deficiency anemia   . LBBB (left bundle branch block)   . Malignant neoplasm of upper lobe bronchus, left ([place])   . Metabolic encephalopathy   . MSSA bacteremia 2021  . Muscle weakness (generalized)   . Peritoneal abscess ([place])   . Pneumonia   . RA (rheumatoid arthritis) ([place])   . Respiratory failure ([place])   . Staph infection   . UGIB (upper gastrointestinal bleed) 2021  . Unspecified protein-calorie malnutrition ([place])      Family History  Problem Relation Age of Onset  . Asthma Mother   . Asthma Father   . COPD Father   . CAD Father   . Asthma Sister   . Breast cancer Paternal Aunt   . High Cholesterol Brother   . Colon cancer Neg Hx      Social History   Socioeconomic History  . Marital status: Divorced  Spouse name: Not on file  . Number of children: 2  . Years of education: Not on file  . Highest education level: Not on file  Occupational History  . Not on file  Tobacco Use  . Smoking status: Former Smoker    Packs/day: 2.00    Years: 20.00    Pack years: 40.00    Types: Cigarettes    Quit date: 06/05/1982    Years since quitting: 37.7  . Smokeless tobacco: Never Used  Vaping Use  . Vaping Use: Never used  Substance and Sexual Activity  . Alcohol use: Yes    Alcohol/week: 1.0 standard drink    Types: 1 Glasses of wine per week    Comment: 1 glass per night  . Drug use:  Never  . Sexual activity: Not Currently  Other Topics Concern  . Not on file  Social History Narrative  . Not on file   Social Determinants of Health   Financial Resource Strain: Not on file  Food Insecurity: Not on file  Transportation Needs: Not on file  Physical Activity: Not on file  Stress: Not on file  Social Connections: Not on file  Intimate Partner Violence: Not on file     Allergies  Allergen Reactions  . Iodinated Diagnostic Agents Anaphylaxis  . Shellfish Allergy Anaphylaxis  . Shellfish-Derived Products Anaphylaxis     Outpatient Medications Prior to Visit  Medication Sig Dispense Refill  . Ascorbic Acid (VITAMIN C) 500 MG CAPS Take 500 mg by mouth daily.    . baclofen (LIORESAL) 10 MG tablet Take 10 mg by mouth 2 (two) times daily as needed for muscle spasms.    . cycloSPORINE (RESTASIS) 0.05 % ophthalmic emulsion Place 1 drop into both eyes 2 (two) times daily.     . DULoxetine (CYMBALTA) 60 MG capsule Take 60 mg by mouth daily.    Marland Kitchen estradiol (CLIMARA - DOSED IN MG/24 HR) 0.0375 mg/24hr patch Place 0.0375 mg onto the skin as directed. Apply one patch to skin two times a week on sundays and wednesdays    . ferrous sulfate 325 (65 FE) MG tablet Take 325 mg by mouth daily with breakfast.    . folic acid (FOLVITE) 1 MG tablet Take 1 mg by mouth daily.    Marland Kitchen gabapentin (NEURONTIN) 600 MG tablet Take 600 mg by mouth 3 (three) times daily.    . pantoprazole (PROTONIX) 40 MG tablet Take 40 mg by mouth daily.     . sacubitril-valsartan (ENTRESTO) 24-26 MG Take 1 tablet by mouth 2 (two) times daily. 180 tablet 0  . traZODone (DESYREL) 50 MG tablet Take 50 mg by mouth at bedtime.    . furosemide (LASIX) 40 MG tablet Take 20mg  daily. May take an additional tablet daily as needed. (Patient not taking: Reported on 03/01/2020) 90 tablet 2   No facility-administered medications prior to visit.    Review of Systems  Constitutional: Negative for chills, fever, malaise/fatigue  and weight loss.  HENT: Negative for congestion, sinus pain and sore throat.   Eyes: Negative.   Respiratory: Positive for cough and shortness of breath.   Cardiovascular: Positive for leg swelling. Negative for chest pain, palpitations, orthopnea and claudication.  Gastrointestinal: Negative for abdominal pain, heartburn, nausea and vomiting.  Genitourinary: Negative.   Musculoskeletal: Positive for joint pain.  Neurological: Negative for dizziness, weakness and headaches.  Psychiatric/Behavioral: Negative.    Objective:   Vitals:   03/01/20 1117  BP: 120/76  Pulse: 91  Temp: 98.4 F (36.9 C)  TempSrc: Temporal  SpO2: 98%  Weight: 120 lb 6.4 oz (54.6 kg)  Height: 5\' 2"  (1.575 m)   Physical Exam Constitutional:      General: She is not in acute distress.    Appearance: She is not ill-appearing.     Comments: Elderly woman  HENT:     Head: Normocephalic and atraumatic.     Nose: Nose normal.     Mouth/Throat:     Mouth: Mucous membranes are moist.     Pharynx: Oropharynx is clear.  Eyes:     General: No scleral icterus.    Conjunctiva/sclera: Conjunctivae normal.     Pupils: Pupils are equal, round, and reactive to light.  Cardiovascular:     Rate and Rhythm: Normal rate and regular rhythm.     Pulses: Normal pulses.     Heart sounds: Normal heart sounds.  Pulmonary:     Effort: Pulmonary effort is normal.     Breath sounds: Rales (scattered at bases, mild) present. No wheezing or rhonchi.  Abdominal:     General: Bowel sounds are normal.     Palpations: Abdomen is soft.  Musculoskeletal:     Right lower leg: Edema (trace) present.     Left lower leg: Edema (trace) present.  Skin:    General: Skin is warm and dry.     Capillary Refill: Capillary refill takes less than 2 seconds.  Neurological:     General: No focal deficit present.     Mental Status: She is alert.  Psychiatric:        Mood and Affect: Mood normal.        Behavior: Behavior normal.         Thought Content: Thought content normal.        Judgment: Judgment normal.    CBC    Component Value Date/Time   WBC 5.2 12/15/2019 0903   WBC 9.4 08/20/2019 1604   RBC 3.42 (L) 12/15/2019 0903   RBC 3.06 (L) 08/20/2019 1604   HGB 10.9 (L) 12/15/2019 0903   HCT 33.0 (L) 12/15/2019 0903   PLT 144 (L) 12/15/2019 0903   MCV 97 12/15/2019 0903   MCH 31.9 12/15/2019 0903   MCH 31.4 08/20/2019 1604   MCHC 33.0 12/15/2019 0903   MCHC 31.3 08/20/2019 1604   RDW 13.8 12/15/2019 0903   LYMPHSABS 3.4 06/25/2019 0500   MONOABS 0.8 06/25/2019 0500   EOSABS 0.4 06/25/2019 0500   BASOSABS 0.0 06/25/2019 0500   BMP Latest Ref Rng & Units 12/15/2019 09/09/2019 08/20/2019  Glucose 65 - 99 mg/dL 93 120(H) 106(H)  BUN 8 - 27 mg/dL 32(H) 38(H) 40(H)  Creatinine 0.57 - 1.00 mg/dL 1.16(H) 1.11(H) 1.46(H)  BUN/Creat Ratio 12 - 28 28 - -  Sodium 134 - 144 mmol/L 135 139 135  Potassium 3.5 - 5.2 mmol/L 4.3 4.4 5.3(H)  Chloride 96 - 106 mmol/L 100 102 99  CO2 20 - 29 mmol/L 27 29 27   Calcium 8.7 - 10.3 mg/dL 9.5 8.9 9.2   Chest imaging: CT Chest 12/31/19 1. Interval resolution pleural fluid. 2. Near complete resolution of basilar atelectasis. 3. Decrease in nodular pleuroparenchymal thickening in the RIGHT lung. 4. No suspicious pulmonary nodules  PFT: PFT Results Latest Ref Rng & Units 03/01/2020  FVC-Pre L 2.11  FVC-Predicted Pre % 79  FVC-Post L 2.13  FVC-Predicted Post % 80  Pre FEV1/FVC % % 77  Post FEV1/FCV % % 78  FEV1-Pre L  1.62  FEV1-Predicted Pre % 81  FEV1-Post L 1.66  DLCO uncorrected ml/min/mmHg 12.43  DLCO UNC% % 69  DLCO corrected ml/min/mmHg 12.43  DLCO COR %Predicted % 69  DLVA Predicted % 76  TLC L 4.45  TLC % Predicted % 93  RV % Predicted % 109    Echo: 06/20/19 1. Left ventricular ejection fraction, by estimation, is 20 to 25%. The  left ventricle has severely decreased function. The left ventricle  demonstrates global hypokinesis. Indeterminate diastolic  filling due to  E-A fusion.  2. Right ventricular systolic function is mildly reduced. The right  ventricular size is moderately enlarged. There is moderately elevated  pulmonary artery systolic pressure. The estimated right ventricular  systolic pressure is 58.0 mmHg.  3. Right atrial size was mild to moderately dilated.  4. The mitral valve is grossly normal. Mild to moderate mitral valve  regurgitation.  5. Tricuspid valve regurgitation is moderate.  6. The aortic valve is tricuspid. Aortic valve regurgitation is not  visualized. No aortic stenosis is present.  7. The inferior vena cava is dilated in size with <50% respiratory  variability, suggesting right atrial pressure of 15 mmHg.  Heart Catheterization: 06/27/19 1. Normal coronary anatomy 2. Moderate to severe LV dysfunction. EF estimated at 35% 3. Mildly elevated LV filling pressures.  4. Mild pulmonary HTN 5. Good cardiac output. Index 4.1  RV 43/77mmHg, PA 47/61mmHg (mean 24mmHg), PW mean 52mmHg  Cardiac MRI 09/26/19 1. Normal left ventricular size, thickness and systolic function (LVEF = 58% with paradoxical septal motion consistent with LBBB. There is no late gadolinium enhancement in the left ventricular myocardium. ECV 28%.  2. Normal right ventricular size, thickness and systolic function (RVEF = 61%). There are no regional wall motion abnormalities.  3.  Normal left and right atrial size.  4. Normal size of the aortic root, ascending aorta and pulmonary artery.  5.  Mild mitral and tricuspid regurgitation.  6.  Normal pericardium.  Trivial pericardial effusion.  Since the echocardiogram on 06/20/2019 there has been improvement of left and right ventricular function, both currently at the normal range. There is no evidence for infiltrative of inflammatory intramyocardial process.    Assessment & Plan:   Diffusion capacity of lung (dl), decreased  Cavitary lesion of lung  Rheumatoid arthritis  with positive rheumatoid factor, involving unspecified site (HCC)  Shortness of breath - Plan: CT Chest High Resolution  Discussion: Samantha Keller is a 74 year old woman, former smoker with rheumatoid arthritis previously on methotrexate, hydroxychloroquine and humira who has been followed in pulmonary clinic for a cavitary lung lesion due to MSSA bacteremia and dyspnea.   She continues to experience exertional dyspnea and has dry cough. Her pulmonary function tests are reassuring today in that spirometry and lung volumes are normal and there is a mild diffusion defect. Her cardiac function has made a nice recovery since her acute illness earlier this year and I do not suspect ongoing issues with pulmonary hypertension as the heart catheterization was performed during an admission with acute heart failure and reduced LV function. Her volume status appears appropriate on today's visit and she is on daily lasix per cardiology.   Review of her chest CT from October shows continued resolution of the pleuroparenchymal thickening of the right upper and lower lungs.   Overall she continues to make forward progress in her recovery from her critical illness earlier this year. Her dyspnea in part may be due to residual deconditioning. There is always  the possibility of lung involvement of her rheumatoid arthritis given the diffusion defect, but we will hold off on further studies at this time such as HRCT as this would not change our management at the current time as she is already experiencing joint pains and will likely be resumed on her RA treatment regimen soon when she follows with rheumatology.   Follow up in 3 months  Freda Jackson, MD [place] Office: [phone]   See Amion for Pager Details      Current Outpatient Medications:  .  Ascorbic Acid (VITAMIN C) 500 MG CAPS, Take 500 mg by mouth daily., Disp: , Rfl:  .  baclofen (LIORESAL) 10 MG tablet, Take 10 mg by  mouth 2 (two) times daily as needed for muscle spasms., Disp: , Rfl:  .  cycloSPORINE (RESTASIS) 0.05 % ophthalmic emulsion, Place 1 drop into both eyes 2 (two) times daily. , Disp: , Rfl:  .  DULoxetine (CYMBALTA) 60 MG capsule, Take 60 mg by mouth daily., Disp: , Rfl:  .  estradiol (CLIMARA - DOSED IN MG/24 HR) 0.0375 mg/24hr patch, Place 0.0375 mg onto the skin as directed. Apply one patch to skin two times a week on sundays and wednesdays, Disp: , Rfl:  .  ferrous sulfate 325 (65 FE) MG tablet, Take 325 mg by mouth daily with breakfast., Disp: , Rfl:  .  folic acid (FOLVITE) 1 MG tablet, Take 1 mg by mouth daily., Disp: , Rfl:  .  gabapentin (NEURONTIN) 600 MG tablet, Take 600 mg by mouth 3 (three) times daily., Disp: , Rfl:  .  pantoprazole (PROTONIX) 40 MG tablet, Take 40 mg by mouth daily. , Disp: , Rfl:  .  sacubitril-valsartan (ENTRESTO) 24-26 MG, Take 1 tablet by mouth 2 (two) times daily., Disp: 180 tablet, Rfl: 0 .  traZODone (DESYREL) 50 MG tablet, Take 50 mg by mouth at bedtime., Disp: , Rfl:  .  furosemide (LASIX) 40 MG tablet, Take 20mg  daily. May take an additional tablet daily as needed. (Patient not taking: Reported on 03/01/2020), Disp: 90 tablet, Rfl: 2

## 2020-03-04 ENCOUNTER — Encounter: Payer: Self-pay | Admitting: Pulmonary Disease

## 2020-03-04 ENCOUNTER — Telehealth: Payer: Self-pay | Admitting: Pulmonary Disease

## 2020-03-04 NOTE — Telephone Encounter (Signed)
Ct Cancelled

## 2020-03-04 NOTE — Telephone Encounter (Signed)
03/04/2020  Attempted to reach the patient.  Left voicemail to contact our office.  We'll route to [facility] pool as well to cancel HRCT per recommendations by Dr. Erin Fulling.  We'll leave in triage for second attempt on 03/05/2020.  Wyn Quaker, FNP

## 2020-03-04 NOTE — Telephone Encounter (Signed)
Please cancel the HRCT chest that was ordered earlier this week in clinic and call the patient with these changes. After further review of her chart, I do not believe this will change our management for her. If she would like, we can send in a prescription for albuterol to see if her cough or shortness of breath improves with a trial of as needed inhaler therapy.   Thanks, Freda Jackson, MD [facility] Office: [phone]   See Amion for Pager Details

## 2020-03-05 ENCOUNTER — Encounter: Payer: Self-pay | Admitting: Pulmonary Disease

## 2020-03-05 NOTE — Telephone Encounter (Signed)
03/05/20   Contacted patient.  Reviewed with patient that of [place] wanted the high-resolution CT chest canceled as he did not feel this would change our plan of care work-up.  Patient endorses understanding.  No further questions.  Patient reports she already has albuterol HFA on hand.  She reports that is not expired.  She does not need a prescription of this at this time.  We will route to Dr. Erin Fulling as Juluis Rainier.  Encourage patient to keep follow-up with our practice in March/2022 Dr. Erin Fulling  Nothing further needed at this time.  Wyn Quaker, FNP

## 2020-03-05 NOTE — Telephone Encounter (Signed)
Patient would like to know why CT was cancelled. Patient phone number is [phone].

## 2020-03-09 ENCOUNTER — Ambulatory Visit (HOSPITAL_BASED_OUTPATIENT_CLINIC_OR_DEPARTMENT_OTHER): Payer: Medicare PPO

## 2020-04-15 ENCOUNTER — Other Ambulatory Visit (HOSPITAL_COMMUNITY): Payer: Self-pay | Admitting: Internal Medicine

## 2020-05-17 NOTE — Progress Notes (Unsigned)
Cardiology Office Note   Date:  05/18/2020   ID:  Samantha, Keller 10-24-1945, MRN [id number]  PCP:  [name], PA-C  Cardiologist:   [name], MD    Pt presents for reeval of NICM, HTN    History of Present Illness: Samantha Keller is a 75 y.o. female with a history ofNICM, LBBB, HTN and RA She was admitted to [facility] earlier this year. In Jan 2021 she was admitted a hosp in [place] with pneumonia/sepsis.  Complicated by Afib and GI bleeding     She was admitted to [facility] in march with CHF  Echo showed LVEF was 20 to 25%  Cardiac cath showed normal coronary arteries     The pt was seen by [name].  Cardiac   MRI in July 2021 showed LVEF 58% with no LGE   RVEF normal  I saw the pt in September 2021   Since seen she has done well  Breathing is good  Has occasional pains in R back  Pleuritic  No L sided CP   No edema Just started back on MTX   Has appt with Rheum soon Living at [facility] now   Active      Current Meds  Medication Sig  . Ascorbic Acid (VITAMIN C) 500 MG CAPS Take 500 mg by mouth daily.  . cycloSPORINE (RESTASIS) 0.05 % ophthalmic emulsion Place 1 drop into both eyes 2 (two) times daily.   . DULoxetine (CYMBALTA) 60 MG capsule Take 60 mg by mouth daily.  Marland Kitchen estradiol (CLIMARA - DOSED IN MG/24 HR) 0.0375 mg/24hr patch Place 0.0375 mg onto the skin as directed. Apply one patch to skin two times a week on sundays and wednesdays  . ferrous sulfate 325 (65 FE) MG tablet Take 325 mg by mouth daily with breakfast.  . folic acid (FOLVITE) 1 MG tablet Take 2 mg by mouth daily.  Marland Kitchen gabapentin (NEURONTIN) 600 MG tablet Take 600 mg by mouth 3 (three) times daily.  . methotrexate (RHEUMATREX) 2.5 MG tablet Take 10 mg by mouth once a week.  . pantoprazole (PROTONIX) 40 MG tablet Take 40 mg by mouth daily.   . sacubitril-valsartan (ENTRESTO) 24-26 MG Take 1 tablet by mouth 2 (two) times daily.  . traZODone (DESYREL) 50 MG tablet Take 50 mg by mouth at  bedtime.     Allergies:   Iodinated diagnostic agents, Shellfish allergy, and Shellfish-derived products   Past Medical History:  Diagnosis Date  . Abscess of bursa of right hip   . Acute kidney failure ([place])   . Alcohol withdrawal ([place])   . Asthma   . Cellulitis of unspecified part of limb   . Chronic tension type headache   . Depression   . Dysphagia, oropharyngeal phase   . Essential (primary) hypertension   . Hyperosmolality and/or hypernatremia   . Hypovolemic shock ([place])   . Iron deficiency anemia   . LBBB (left bundle branch block)   . Malignant neoplasm of upper lobe bronchus, left ([place])   . Metabolic encephalopathy   . MSSA bacteremia 2021  . Muscle weakness (generalized)   . Peritoneal abscess ([place])   . Pneumonia   . RA (rheumatoid arthritis) ([place])   . Respiratory failure ([place])   . Staph infection   . UGIB (upper gastrointestinal bleed) 2021  . Unspecified protein-calorie malnutrition ([place])     Past Surgical History:  Procedure Laterality Date  . ABCESS DRAINAGE Bilateral 2021  .  ABDOMINAL HYSTERECTOMY    . BREAST ENHANCEMENT SURGERY    . CERVICAL FUSION    . COLONOSCOPY     about 5 years ago. Been doing cologuard  . ESOPHAGOGASTRODUODENOSCOPY     [place]   . EYE SURGERY Bilateral    lens replacement  . INCISION AND DRAINAGE OF WOUND Bilateral 2021   hip, thigh  . IR THORACENTESIS ASP PLEURAL SPACE W/IMG GUIDE  06/20/2019  . LUMBAR FUSION    . RIGHT/LEFT HEART CATH AND CORONARY ANGIOGRAPHY N/A 06/27/2019   Procedure: RIGHT/LEFT HEART CATH AND CORONARY ANGIOGRAPHY;  Surgeon: Martinique, Peter M, MD;  Location: [facility];  Service: Cardiovascular;  Laterality: N/A;  . TONSILLECTOMY       Social History:  The patient  reports that she quit smoking about 37 years ago. Her smoking use included cigarettes. She has a 40.00 pack-year smoking history. She has never used smokeless tobacco. She reports current alcohol use of about 1.0  standard drink of alcohol per week. She reports that she does not use drugs.   Family History:  The patient's family history includes Asthma in her father, mother, and sister; Breast cancer in her paternal aunt; CAD in her father; COPD in her father; High Cholesterol in her brother.    ROS:  Please see the history of present illness. All other systems are reviewed and  Negative to the above problem except as noted.    PHYSICAL EXAM: VS:  BP (!) 120/48   Pulse 70   Ht 5\' 2"  (1.575 m)   Wt 123 lb 9.6 oz (56.1 kg)   SpO2 91%   BMI 22.61 kg/m   GEN: Well nourished, well developed, in no acute distress  HEENT: normal  Neck: no JVD, carotid bruits, or masses Cardiac: RRR; no murmurs  no LE edema  Respiratory:  clear to auscultation bilaterally,  GI: soft, nontender, nondistended, + BS  No hepatomegaly  MS: no deformity Moving all extremities   Skin: warm and dry, no rash Neuro:  Strength and sensation are intact Psych: euthymic mood, full affect   EKG:  EKG is not ordered today.   Lipid Panel No results found for: CHOL, TRIG, HDL, CHOLHDL, VLDL, LDLCALC, LDLDIRECT    Wt Readings from Last 3 Encounters:  05/18/20 123 lb 9.6 oz (56.1 kg)  03/01/20 120 lb 6.4 oz (54.6 kg)  12/23/19 118 lb 6.4 oz (53.7 kg)      ASSESSMENT AND PLAN:  1  Hx NICM   Pt had MRI in July 2021 that showed normalization of LVEF    Clinically doing well   I do not suspecft change   Would follow clinically  Keep on Entresto  2  Hx HTN    BP is good  Contnue    3 CP  R sided back pain  Proably muscular  4  Lpids   WIll need to be followed   Current medicines are reviewed at length with the patient today.  The patient does not have concerns regarding medicines.  Signed, [name], MD  05/18/2020 11:48 AM    [address], [place], [place]  [address] Phone: [phone]; Fax: [phone]

## 2020-05-18 ENCOUNTER — Ambulatory Visit: Payer: Medicare PPO | Admitting: Internal Medicine

## 2020-05-18 ENCOUNTER — Encounter: Payer: Self-pay | Admitting: Internal Medicine

## 2020-05-18 ENCOUNTER — Other Ambulatory Visit: Payer: Self-pay

## 2020-05-18 VITALS — BP 120/48 | HR 70 | Ht 62.0 in | Wt 123.6 lb

## 2020-05-18 DIAGNOSIS — R002 Palpitations: Secondary | ICD-10-CM

## 2020-05-18 NOTE — Patient Instructions (Signed)
Medication Instructions:  No changes *If you need a refill on your cardiac medications before your next appointment, please call your pharmacy*   Lab Work: none If you have labs (blood work) drawn today and your tests are completely normal, you will receive your results only by: Marland Kitchen MyChart Message (if you have MyChart) OR . A paper copy in the mail If you have any lab test that is abnormal or we need to change your treatment, we will call you to review the results.   Testing/Procedures: none   Follow-Up: At [facility], you and your health needs are our priority.  As part of our continuing mission to provide you with exceptional heart care, we have created designated Provider Care Teams.  These Care Teams include your primary Cardiologist (physician) and Advanced Practice Providers (APPs -  Physician Assistants and Nurse Practitioners) who all work together to provide you with the care you need, when you need it.   Your next appointment:   9 month(s)  The format for your next appointment:   In Person  Provider:   You may see [name], MD or one of the following Advanced Practice Providers on your designated Care Team:    Richardson Dopp, PA-C  [place], [place]

## 2020-08-05 ENCOUNTER — Observation Stay (HOSPITAL_COMMUNITY)
Admission: AD | Admit: 2020-08-05 | Discharge: 2020-08-06 | Disposition: A | Discharge status: Home / Self Care | Payer: Medicare PPO | Source: Other Acute Inpatient Hospital | Attending: Family Medicine | Admitting: Family Medicine

## 2020-08-05 DIAGNOSIS — Z20822 Contact with and (suspected) exposure to covid-19: Secondary | ICD-10-CM | POA: Diagnosis not present

## 2020-08-05 DIAGNOSIS — Z7901 Long term (current) use of anticoagulants: Secondary | ICD-10-CM

## 2020-08-05 DIAGNOSIS — I5022 Chronic systolic (congestive) heart failure: Secondary | ICD-10-CM | POA: Diagnosis not present

## 2020-08-05 DIAGNOSIS — I110 Hypertensive heart disease with heart failure: Secondary | ICD-10-CM | POA: Diagnosis not present

## 2020-08-05 DIAGNOSIS — I4891 Unspecified atrial fibrillation: Principal | ICD-10-CM

## 2020-08-05 DIAGNOSIS — Z85118 Personal history of other malignant neoplasm of bronchus and lung: Secondary | ICD-10-CM | POA: Diagnosis not present

## 2020-08-05 DIAGNOSIS — J45909 Unspecified asthma, uncomplicated: Secondary | ICD-10-CM | POA: Diagnosis not present

## 2020-08-05 DIAGNOSIS — Z79899 Other long term (current) drug therapy: Secondary | ICD-10-CM | POA: Diagnosis not present

## 2020-08-05 DIAGNOSIS — M069 Rheumatoid arthritis, unspecified: Secondary | ICD-10-CM | POA: Diagnosis present

## 2020-08-05 DIAGNOSIS — F32A Depression, unspecified: Secondary | ICD-10-CM

## 2020-08-05 DIAGNOSIS — K219 Gastro-esophageal reflux disease without esophagitis: Secondary | ICD-10-CM

## 2020-08-05 DIAGNOSIS — Z87891 Personal history of nicotine dependence: Secondary | ICD-10-CM | POA: Insufficient documentation

## 2020-08-05 DIAGNOSIS — I509 Heart failure, unspecified: Secondary | ICD-10-CM

## 2020-08-05 DIAGNOSIS — Z8679 Personal history of other diseases of the circulatory system: Secondary | ICD-10-CM

## 2020-08-05 DIAGNOSIS — Z5181 Encounter for therapeutic drug level monitoring: Secondary | ICD-10-CM

## 2020-08-05 DIAGNOSIS — I480 Paroxysmal atrial fibrillation: Secondary | ICD-10-CM

## 2020-08-05 DIAGNOSIS — R0602 Shortness of breath: Secondary | ICD-10-CM | POA: Diagnosis present

## 2020-08-05 LAB — TROPONIN I (HIGH SENSITIVITY): Troponin I (High Sensitivity): 203 ng/L (ref <18)

## 2020-08-05 LAB — TSH: TSH: 3.227 u[IU]/mL (ref 0.350–4.500)

## 2020-08-05 MED ORDER — ACETAMINOPHEN 325 MG PO TABS: 650.0000 mg | ORAL_TABLET | ORAL | Status: DC | PRN

## 2020-08-05 MED ORDER — HEPARIN (PORCINE) 25000 UT/250ML-% IV SOLN
850.0000 [IU]/h | INTRAVENOUS | Status: DC
  Administered 2020-08-05: 850 [IU]/h via INTRAVENOUS
  Filled 2020-08-05: qty 250

## 2020-08-05 MED ORDER — DILTIAZEM HCL-DEXTROSE 125-5 MG/125ML-% IV SOLN (PREMIX)
5.0000 mg/h | INTRAVENOUS | Status: DC
  Filled 2020-08-05: qty 125

## 2020-08-05 MED ORDER — TRAZODONE HCL 50 MG PO TABS
50.0000 mg | ORAL_TABLET | Freq: Every day | ORAL | Status: DC
  Administered 2020-08-06: 50 mg via ORAL
  Filled 2020-08-05: qty 1

## 2020-08-05 MED ORDER — HEPARIN BOLUS VIA INFUSION
2500.0000 [IU] | Freq: Once | INTRAVENOUS | Status: AC
  Administered 2020-08-05: 2500 [IU] via INTRAVENOUS
  Filled 2020-08-05: qty 2500

## 2020-08-05 NOTE — H&P (Signed)
History and Physical    Samantha Keller EHU:[id number] DOB: [birth year] DOA: 08/05/2020  PCP: [name], PA-C Patient coming from: [facility]  Chief Complaint: Shortness of breath  HPI: Samantha Keller is a 75 y.o. 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  She went to [facility] and was found to be hypotensive.  In the ED, she was noted to be in A. fib with RVR.  She was given 500 cc fluid bolus and her dizziness and shortness of breath resolved.  Labs showing WBC 10.9, hemoglobin 10.8 (at baseline), platelet count 245K.  Sodium 134, potassium 4.3, chloride 103, bicarb 20, BUN 21, creatinine 1.4 (ranging between 1.1-1.4 approximately a year ago), glucose 108.  Magnesium 1.9.  BNP 412.  Troponin <0.012.  INR 0.97.  Chest x-ray negative for acute findings.  EKG showing A. fib with RVR with rate in the 110s, left bundle branch block similar to prior tracings.  Patient was started on Cardizem infusion and transferred to [facility] as she lives in [place].  Patient states this morning while in the car with her daughter she started having chest heaviness and shortness of breath.  When she tried to get out of the car she felt unstable and dizzy.  She was having a throbbing headache at that time and seeing "diamonds."  Does report history of prior headaches.  States she feels much better now.  She walked to the bathroom in the room without any shortness of breath but is still having some slight chest heaviness.  Her headache has now significantly improved.  She is requesting ice cream to eat.  Denies history of heart  palpitations.  Does state that last year she was hospitalized and was very ill and admitted to the ICU.  She was told that she had A. fib at that time but was not started on a blood thinner as she also had a gastrointestinal bleed at the time of that hospitalization.  States she has not had any recurrence of GI bleed symptoms since that hospitalization last year.  She is fully vaccinated against COVID including booster shot.  Review of Systems:  All systems reviewed and apart from history of presenting illness, are negative.  Past Medical History:  Diagnosis Date  . Abscess of bursa of right hip   . Acute kidney failure ([place])   . Alcohol withdrawal ([place])   . Asthma   . Cellulitis of unspecified part of limb   . Chronic tension type headache   . Depression   . Dysphagia, oropharyngeal phase   . Essential (primary) hypertension   . Hyperosmolality and/or hypernatremia   . Hypovolemic shock ([place])   . Iron deficiency anemia   . LBBB (left bundle branch block)   . Malignant neoplasm of upper lobe bronchus, left ([place])   . Metabolic encephalopathy   . MSSA bacteremia 2021  . Muscle weakness (generalized)   . Peritoneal abscess ([place])   . Pneumonia   . RA (rheumatoid arthritis) ([place])   . Respiratory failure ([place])   . Staph infection   . UGIB (upper gastrointestinal bleed) 2021  . Unspecified protein-calorie malnutrition ([place])     Past Surgical History:  Procedure Laterality Date  .  ABCESS DRAINAGE Bilateral 2021  . ABDOMINAL HYSTERECTOMY    . BREAST ENHANCEMENT SURGERY    . CERVICAL FUSION    . COLONOSCOPY     about 5 years ago. Been doing cologuard  . ESOPHAGOGASTRODUODENOSCOPY     [place]   . EYE SURGERY Bilateral    lens replacement  . INCISION AND DRAINAGE OF WOUND Bilateral 2021   hip, thigh  . IR THORACENTESIS ASP PLEURAL SPACE W/IMG GUIDE  06/20/2019  . LUMBAR FUSION    . RIGHT/LEFT HEART CATH AND CORONARY ANGIOGRAPHY N/A 06/27/2019   Procedure:  RIGHT/LEFT HEART CATH AND CORONARY ANGIOGRAPHY;  Surgeon: Martinique, Peter M, MD;  Location: [facility];  Service: Cardiovascular;  Laterality: N/A;  . TONSILLECTOMY       reports that she quit smoking about 38 years ago. Her smoking use included cigarettes. She has a 40.00 pack-year smoking history. She has never used smokeless tobacco. She reports current alcohol use of about 1.0 standard drink of alcohol per week. She reports that she does not use drugs.  Allergies  Allergen Reactions  . Iodinated Diagnostic Agents Anaphylaxis  . Shellfish Allergy Anaphylaxis  . Shellfish-Derived Products Anaphylaxis    Family History  Problem Relation Age of Onset  . Asthma Mother   . Asthma Father   . COPD Father   . CAD Father   . Asthma Sister   . Breast cancer Paternal Aunt   . High Cholesterol Brother   . Colon cancer Neg Hx     Prior to Admission medications   Medication Sig Start Date End Date Taking? Authorizing Provider  Ascorbic Acid (VITAMIN C) 500 MG CAPS Take 500 mg by mouth daily.    [provider]  cycloSPORINE (RESTASIS) 0.05 % ophthalmic emulsion Place 1 drop into both eyes 2 (two) times daily.     [provider]  DULoxetine (CYMBALTA) 60 MG capsule Take 60 mg by mouth daily.    [provider]  estradiol (CLIMARA - DOSED IN MG/24 HR) 0.0375 mg/24hr patch Place 0.0375 mg onto the skin as directed. Apply one patch to skin two times a week on sundays and wednesdays    [provider]  ferrous sulfate 325 (65 FE) MG tablet Take 325 mg by mouth daily with breakfast.    [provider]  folic acid (FOLVITE) 1 MG tablet Take 2 mg by mouth daily.    [provider]  gabapentin (NEURONTIN) 600 MG tablet Take 600 mg by mouth 3 (three) times daily.    [provider]  methotrexate (RHEUMATREX) 2.5 MG tablet Take 10 mg by mouth once a week. 04/21/20   [provider]  pantoprazole (PROTONIX) 40 MG tablet Take 40  mg by mouth daily.     [provider]  sacubitril-valsartan (ENTRESTO) 24-26 MG Take 1 tablet by mouth 2 (two) times daily. 02/09/20   Fay Records, MD  traZODone (DESYREL) 50 MG tablet Take 50 mg by mouth at bedtime. 12/10/19   [provider]    Physical Exam: Vitals:   08/05/20 1903 08/05/20 1910 08/05/20 1916  BP: (!) 96/46 102/62   Pulse: 81    Resp: 18    Temp: 98.4 F (36.9 C)    TempSrc: Oral    SpO2: 99%    Weight:   56.8 kg    Physical Exam Constitutional:      General: She is not in acute distress. HENT:  Head: Normocephalic and atraumatic.  Eyes:     Extraocular Movements: Extraocular movements intact.     Conjunctiva/sclera: Conjunctivae normal.  Cardiovascular:     Rate and Rhythm: Normal rate and regular rhythm.     Pulses: Normal pulses.  Pulmonary:     Effort: Pulmonary effort is normal. No respiratory distress.     Breath sounds: Normal breath sounds. No wheezing or rales.  Abdominal:     General: Bowel sounds are normal. There is no distension.     Palpations: Abdomen is soft.     Tenderness: There is no abdominal tenderness.  Musculoskeletal:        General: No swelling or tenderness.     Cervical back: Normal range of motion and neck supple.  Skin:    General: Skin is warm and dry.  Neurological:     General: No focal deficit present.     Mental Status: She is alert and oriented to person, place, and time.     Cranial Nerves: No cranial nerve deficit.     Sensory: No sensory deficit.     Motor: No weakness.     Labs on Admission: I have personally reviewed following labs and imaging studies  CBC: No results for input(s): WBC, NEUTROABS, HGB, HCT, MCV, PLT in the last 168 hours. Basic Metabolic Panel: No results for input(s): NA, K, CL, CO2, GLUCOSE, BUN, CREATININE, CALCIUM, MG, PHOS in the last 168 hours. GFR: CrCl cannot be calculated (Patient's most recent lab result is older than the maximum 21 days  allowed.). Liver Function Tests: No results for input(s): AST, ALT, ALKPHOS, BILITOT, PROT, ALBUMIN in the last 168 hours. No results for input(s): LIPASE, AMYLASE in the last 168 hours. No results for input(s): AMMONIA in the last 168 hours. Coagulation Profile: No results for input(s): INR, PROTIME in the last 168 hours. Cardiac Enzymes: No results for input(s): CKTOTAL, CKMB, CKMBINDEX, TROPONINI in the last 168 hours. BNP (last 3 results) No results for input(s): PROBNP in the last 8760 hours. HbA1C: No results for input(s): HGBA1C in the last 72 hours. CBG: No results for input(s): GLUCAP in the last 168 hours. Lipid Profile: No results for input(s): CHOL, HDL, LDLCALC, TRIG, CHOLHDL, LDLDIRECT in the last 72 hours. Thyroid Function Tests: No results for input(s): TSH, T4TOTAL, FREET4, T3FREE, THYROIDAB in the last 72 hours. Anemia Panel: No results for input(s): VITAMINB12, FOLATE, FERRITIN, TIBC, IRON, RETICCTPCT in the last 72 hours. Urine analysis:    Component Value Date/Time   COLORURINE AMBER (A) 06/19/2019 2250   APPEARANCEUR CLOUDY (A) 06/19/2019 2250   LABSPEC >1.046 (H) 06/19/2019 2250   PHURINE 6.0 06/19/2019 2250   GLUCOSEU 50 (A) 06/19/2019 2250   HGBUR NEGATIVE 06/19/2019 2250   BILIRUBINUR NEGATIVE 06/19/2019 2250   KETONESUR 5 (A) 06/19/2019 2250   PROTEINUR 30 (A) 06/19/2019 2250   NITRITE NEGATIVE 06/19/2019 2250   LEUKOCYTESUR LARGE (A) 06/19/2019 2250    Radiological Exams on Admission: No results found.  Assessment/Plan Principal Problem:   Atrial fibrillation, rapid (HCC) Active Problems:   Rheumatoid arthritis (HCC)   CHF (congestive heart failure) (HCC)   Asthma   Depression  A. fib with RVR: Patient is followed by [facility] cardiology.  Per review of cardiology note, it seems she was in A. fib during a hospitalization back in January 2021 in [place] in the setting of pneumonia/sepsis but was not started on anticoagulation as  she also had GI bleed at that time.  CHA2DS2-VASc  6.  In the ED today, found to be in A. fib with RVR and was started on Cardizem infusion.  Continues to be in A. fib but currently rate controlled.  Blood pressure currently stable. -Cardiac monitoring.  Continue Cardizem drip.  Discussed risk versus benefits of anticoagulation with the patient and she is interested in starting chronic anticoagulation for A. fib.  Start IV heparin.  Echocardiogram ordered.  Check TSH level.  I have sent a message to cardiology requesting consultation in the morning.  Chest pain: Continues to complain of slight chest heaviness.  EKG done at outside hospital did not show acute ischemic changes and troponin was negative. -Cardiac monitoring.  Repeat troponin and EKG.  Chronic systolic CHF secondary to nonischemic cardiomyopathy: EF 20 to 25%, mild to moderate mitral regurgitation, and moderate tricuspid regurgitation on echo done 06/20/2019.  BNP 412 but chest x-ray done at outside facility did not show any acute findings.  Does not appear volume overloaded on exam. -Resume home meds after pharmacy med rec is done.  Rheumatoid arthritis -Resume home medications after pharmacy med rec is done.  Asthma: Stable.  No signs of acute exacerbation. -Resume home meds after pharmacy med rec is done.  Depression -Resume home medications after pharmacy med rec is done.  DVT prophylaxis: Heparin Code Status: Patient wishes to be full code. Family Communication: No family available at this time. Disposition Plan: Status is: Observation  The patient remains OBS appropriate and will d/c before 2 midnights.  Dispo: The patient is from: Home              Anticipated d/c is to: Home              Patient currently is not medically stable to d/c.   Difficult to place patient No   Level of care: Progressive care  The medical decision making on this patient was of high complexity and the patient is at high risk for clinical  deterioration, therefore this is a level 3 visit.  Shela Leff MD [facility]  If 7PM-7AM, please contact night-coverage [email or web address]  08/05/2020, 8:18 PM

## 2020-08-05 NOTE — Progress Notes (Signed)
ANTICOAGULATION CONSULT NOTE - Initial Consult  Pharmacy Consult for heparin Indication: atrial fibrillation  Allergies  Allergen Reactions  . Iodinated Diagnostic Agents Anaphylaxis  . Shellfish Allergy Anaphylaxis  . Shellfish-Derived Products Anaphylaxis    Patient Measurements: Weight: 56.8 kg (125 lb 3.5 oz) Heparin Dosing Weight: 57kg  Vital Signs: Temp: 98.4 F (36.9 C) (05/12 1903) Temp Source: Oral (05/12 1903) BP: 102/62 (05/12 1910) Pulse Rate: 81 (05/12 1903)  Labs: No results for input(s): HGB, HCT, PLT, APTT, LABPROT, INR, HEPARINUNFRC, HEPRLOWMOCWT, CREATININE, CKTOTAL, CKMB, TROPONINIHS in the last 72 hours.  CrCl cannot be calculated (Patient's most recent lab result is older than the maximum 21 days allowed.).   Medical History: Past Medical History:  Diagnosis Date  . Abscess of bursa of right hip   . Acute kidney failure ([place])   . Alcohol withdrawal ([place])   . Asthma   . Cellulitis of unspecified part of limb   . Chronic tension type headache   . Depression   . Dysphagia, oropharyngeal phase   . Essential (primary) hypertension   . Hyperosmolality and/or hypernatremia   . Hypovolemic shock ([place])   . Iron deficiency anemia   . LBBB (left bundle branch block)   . Malignant neoplasm of upper lobe bronchus, left ([place])   . Metabolic encephalopathy   . MSSA bacteremia 2021  . Muscle weakness (generalized)   . Peritoneal abscess ([place])   . Pneumonia   . RA (rheumatoid arthritis) ([place])   . Respiratory failure ([place])   . Staph infection   . UGIB (upper gastrointestinal bleed) 2021  . Unspecified protein-calorie malnutrition ([place])     Assessment: 47 yoF admitted from OSH with AFib RVR. Pt on diltiazem drip but no anticoagulation and none PTA. Pt has hx GIB.  Goal of Therapy:  Heparin level 0.3-0.7 units/ml Monitor platelets by anticoagulation protocol: Yes   Plan:  Heparin 2500 units x1 then 850 units/h Check heparin level in 8h   Arrie Senate, PharmD, [place], [facility] Clinical Pharmacist [phone] Please check AMION for all [place] numbers 08/05/2020

## 2020-08-06 ENCOUNTER — Observation Stay (HOSPITAL_BASED_OUTPATIENT_CLINIC_OR_DEPARTMENT_OTHER): Payer: Medicare PPO

## 2020-08-06 DIAGNOSIS — I480 Paroxysmal atrial fibrillation: Secondary | ICD-10-CM | POA: Diagnosis not present

## 2020-08-06 DIAGNOSIS — K219 Gastro-esophageal reflux disease without esophagitis: Secondary | ICD-10-CM | POA: Diagnosis not present

## 2020-08-06 DIAGNOSIS — Z8679 Personal history of other diseases of the circulatory system: Secondary | ICD-10-CM | POA: Diagnosis not present

## 2020-08-06 DIAGNOSIS — M05729 Rheumatoid arthritis with rheumatoid factor of unspecified elbow without organ or systems involvement: Secondary | ICD-10-CM

## 2020-08-06 DIAGNOSIS — I4891 Unspecified atrial fibrillation: Secondary | ICD-10-CM | POA: Diagnosis not present

## 2020-08-06 DIAGNOSIS — Z5181 Encounter for therapeutic drug level monitoring: Secondary | ICD-10-CM

## 2020-08-06 DIAGNOSIS — Z7901 Long term (current) use of anticoagulants: Secondary | ICD-10-CM

## 2020-08-06 LAB — CBC
HCT: 32.1 % — ABNORMAL LOW (ref 36.0–46.0)
Hemoglobin: 10.7 g/dL — ABNORMAL LOW (ref 12.0–15.0)
MCH: 34 pg (ref 26.0–34.0)
MCHC: 33.3 g/dL (ref 30.0–36.0)
MCV: 101.9 fL — ABNORMAL HIGH (ref 80.0–100.0)
Platelets: 232 10*3/uL (ref 150–400)
RBC: 3.15 MIL/uL — ABNORMAL LOW (ref 3.87–5.11)
RDW: 15.2 % (ref 11.5–15.5)
WBC: 7.6 10*3/uL (ref 4.0–10.5)
nRBC: 0 % (ref 0.0–0.2)

## 2020-08-06 LAB — ECHOCARDIOGRAM COMPLETE
AR max vel: 2.47 cm2
AV Area VTI: 2.22 cm2
AV Area mean vel: 2.24 cm2
AV Mean grad: 4 mmHg
AV Peak grad: 8.5 mmHg
Ao pk vel: 1.46 m/s
Area-P 1/2: 4.39 cm2
Height: 62 in
S' Lateral: 2.7 cm
Weight: 1961.6 oz

## 2020-08-06 LAB — SARS CORONAVIRUS 2 (TAT 6-24 HRS): SARS Coronavirus 2: NEGATIVE

## 2020-08-06 LAB — TROPONIN I (HIGH SENSITIVITY)
Troponin I (High Sensitivity): 195 ng/L (ref <18)
Troponin I (High Sensitivity): 170 ng/L (ref <18)

## 2020-08-06 LAB — HEPARIN LEVEL (UNFRACTIONATED): Heparin Unfractionated: 0.39 IU/mL (ref 0.30–0.70)

## 2020-08-06 MED ORDER — ASCORBIC ACID 500 MG PO TABS
500.0000 mg | ORAL_TABLET | Freq: Every day | ORAL | Status: DC
  Administered 2020-08-06: 500 mg via ORAL
  Filled 2020-08-06: qty 1

## 2020-08-06 MED ORDER — CYCLOSPORINE 0.05 % OP EMUL
1.0000 [drp] | Freq: Two times a day (BID) | OPHTHALMIC | Status: DC
  Filled 2020-08-06 (×2): qty 1

## 2020-08-06 MED ORDER — FERROUS SULFATE 325 (65 FE) MG PO TABS: 325.0000 mg | ORAL_TABLET | Freq: Every day | ORAL | Status: DC

## 2020-08-06 MED ORDER — APIXABAN 5 MG PO TABS: 5.0000 mg | ORAL_TABLET | Freq: Two times a day (BID) | ORAL | 3 refills | Status: DC

## 2020-08-06 MED ORDER — APIXABAN 5 MG PO TABS
5.0000 mg | ORAL_TABLET | Freq: Two times a day (BID) | ORAL | Status: DC
  Administered 2020-08-06: 5 mg via ORAL
  Filled 2020-08-06: qty 1

## 2020-08-06 MED ORDER — GABAPENTIN 600 MG PO TABS
600.0000 mg | ORAL_TABLET | Freq: Three times a day (TID) | ORAL | Status: DC
  Administered 2020-08-06: 600 mg via ORAL
  Filled 2020-08-06: qty 1

## 2020-08-06 MED ORDER — SACUBITRIL-VALSARTAN 24-26 MG PO TABS
1.0000 | ORAL_TABLET | Freq: Two times a day (BID) | ORAL | Status: DC
  Administered 2020-08-06: 1 via ORAL
  Filled 2020-08-06: qty 1

## 2020-08-06 MED ORDER — FOLIC ACID 1 MG PO TABS
2.0000 mg | ORAL_TABLET | Freq: Every day | ORAL | Status: DC
  Administered 2020-08-06: 2 mg via ORAL
  Filled 2020-08-06: qty 2

## 2020-08-06 MED ORDER — PANTOPRAZOLE SODIUM 40 MG PO TBEC
40.0000 mg | DELAYED_RELEASE_TABLET | Freq: Every day | ORAL | Status: DC
  Administered 2020-08-06: 40 mg via ORAL
  Filled 2020-08-06: qty 1

## 2020-08-06 MED ORDER — DULOXETINE HCL 60 MG PO CPEP
60.0000 mg | ORAL_CAPSULE | Freq: Every day | ORAL | Status: DC
  Administered 2020-08-06: 60 mg via ORAL
  Filled 2020-08-06: qty 1

## 2020-08-06 MED ORDER — METOPROLOL TARTRATE 25 MG PO TABS
25.0000 mg | ORAL_TABLET | ORAL | 3 refills | Status: DC | PRN
Start: 2020-08-06 — End: 2022-02-21

## 2020-08-06 MED ORDER — MELATONIN 5 MG PO TABS: 5.0000 mg | ORAL_TABLET | Freq: Every day | ORAL | Status: DC

## 2020-08-06 NOTE — Discharge Summary (Signed)
Physician Discharge Summary  Samantha Keller GLO:[id number] DOB: 05-Apr-1945 DOA: 08/05/2020  PCP: [name], PA-C  Admit date: 08/05/2020 Discharge date: 08/06/2020  Time spent: 50 minutes  Recommendations for Outpatient Follow-up:  1. Follow-up cardiology in 2 weeks 2. Follow-up in A. fib clinic  Discharge Diagnoses:  Principal Problem:   Atrial fibrillation, rapid ([place]) Active Problems:   Rheumatoid arthritis ([place])   CHF (congestive heart failure) ([place])   Asthma   Depression   Discharge Condition: Stable  Diet recommendation: Heart healthy diet  Filed Weights   08/05/20 1916 08/06/20 0405  Weight: 56.8 kg 55.6 kg    History of present illness:  75 year old female with medical history of atrial fibrillation, not on anticoagulation, chronic systolic CHF secondary to nonischemic cardiomyopathy, left bundle branch block, rheumatoid arthritis, asthma, depression, hypertension, CVA/TIA presented to [facility] in [place] on 08/05/2020 with chest heaviness, dizziness, headache and shortness of breath.  She went to [facility] and was found to be hypotensive.  In the ED she was noted to be in A. fib with RVR, she was given 500 cc fluid bolus for dizziness and shortness of breath resolved.  Lab work over there showed WBC 10.9, hemoglobin 10.8, platelet count 2 45,000, sodium 134, potassium 4.3, chloride 103, bicarb 20, BUN 21, creatinine 1.4, glucose 108, magnesium 1.9, BNP 412.  Troponin was less than 1.012, INR 0.97.  EKG showed A. fib with RVR with rate of 110, left bundle branch block.  Patient was started on Cardizem infusion and transferred to [facility]  Patient rhythm had converted to normal sinus rhythm in the hospital, she was started on IV heparin.  Cardiology was consulted.  Troponin was trending down  203>> 195>> 170  Hospital Course:   Paroxysmal atrial fibrillation-patient converted back to normal sinus rhythm last night.  At this time  heart rate is well controlled. CHA2DS2VASc score is 3.  Patient started on Eliquis per cardiology recommendation.  As patient blood pressure is soft, cardiology recommending metoprolol 25 mg as needed for palpitations.  Patient will follow up with cardiology in 2 weeks as outpatient.  Chest pain-resolved, in the setting of A. fib with RVR, EKG was unremarkable.  Troponin trending down.  Troponin elevation likely from demand ischemia in setting of A. fib with RVR.  Cardiology recommends no further recommendations.  Chronic combined CHF/and ICM-patient's earlier EF of 20-25% as of March 2021, echocardiogram done yesterday shows recovery of EF to 50- 60%.  Also shows grade 2 diastolic dysfunction.  Continue Entresto.  Patient is euvolemic.  No diuretics recommended.    Procedures:  Echocardiogram  Consultations:  Cardiology  Discharge Exam: Vitals:   08/06/20 0858 08/06/20 1537  BP: (!) 105/50 105/77  Pulse: 63   Resp:  16  Temp: 97.8 F (36.6 C)   SpO2: 100%     General: Appears in no acute distress Cardiovascular: S1-S2, regular Respiratory: Clear to auscultation bilaterally  Discharge Instructions   Discharge Instructions    Amb referral to AFIB Clinic   Complete by: As directed    Diet - low sodium heart healthy   Complete by: As directed    Increase activity slowly   Complete by: As directed      Allergies as of 08/06/2020      Reactions   Iodinated Diagnostic Agents Anaphylaxis   Shellfish Allergy Anaphylaxis   Shellfish-derived Products Anaphylaxis      Medication List    TAKE these medications  apixaban 5 MG Tabs tablet Commonly known as: ELIQUIS Take 1 tablet (5 mg total) by mouth 2 (two) times daily. Start taking on: Aug 07, 2020   cycloSPORINE 0.05 % ophthalmic emulsion Commonly known as: RESTASIS Place 1 drop into both eyes 2 (two) times daily.   DULoxetine 60 MG capsule Commonly known as: CYMBALTA Take 60 mg by mouth daily.   estradiol  0.0375 mg/24hr patch Commonly known as: CLIMARA - Dosed in mg/24 hr Place 0.0375 mg onto the skin as directed. Apply one patch to skin two times a week on sundays and wednesdays   ferrous sulfate 325 (65 FE) MG tablet Take 325 mg by mouth daily with breakfast.   folic acid 1 MG tablet Commonly known as: FOLVITE Take 2 mg by mouth daily.   gabapentin 600 MG tablet Commonly known as: NEURONTIN Take 600 mg by mouth 3 (three) times daily.   melatonin 5 MG Tabs Take 5 mg by mouth at bedtime.   methotrexate 2.5 MG tablet Commonly known as: RHEUMATREX Take 10 mg by mouth once a week.   metoprolol tartrate 25 MG tablet Commonly known as: LOPRESSOR Take 1 tablet (25 mg total) by mouth as needed (Palpitations).   pantoprazole 40 MG tablet Commonly known as: PROTONIX Take 40 mg by mouth daily.   sacubitril-valsartan 24-26 MG Commonly known as: ENTRESTO Take 1 tablet by mouth 2 (two) times daily.   traZODone 50 MG tablet Commonly known as: DESYREL Take 50 mg by mouth at bedtime.   Vitamin C 500 MG Caps Take 500 mg by mouth daily.      Allergies  Allergen Reactions  . Iodinated Diagnostic Agents Anaphylaxis  . Shellfish Allergy Anaphylaxis  . Shellfish-Derived Products Anaphylaxis      The results of significant diagnostics from this hospitalization (including imaging, microbiology, ancillary and laboratory) are listed below for reference.    Significant Diagnostic Studies: ECHOCARDIOGRAM COMPLETE  Result Date: 08/06/2020    ECHOCARDIOGRAM REPORT   Patient Name:   Samantha Keller Date of Exam: 08/06/2020 Medical Rec #:  [id number]      Height:       62.0 in Accession #:    [id number]     Weight:       122.6 lb Date of Birth:  06/30/1945       BSA:          1.553 m Patient Age:    75 years       BP:           112/55 mmHg Patient Gender: F              HR:           63  bpm. Exam Location:  Inpatient Procedure: 2D Echo, Cardiac Doppler and Color Doppler Indications:    Atrial  fibrillation  History:        Patient has prior history of Echocardiogram examinations, most                 recent 06/20/2019. TIA; Risk Factors:Hypertension.  Sonographer:    Cammy Brochure Referring Phys: [id number] [place]  1. Left ventricular ejection fraction, by estimation, is 55 to 60%. The left ventricle has normal function. The left ventricle has no regional wall motion abnormalities. Left ventricular diastolic parameters are consistent with Grade II diastolic dysfunction (pseudonormalization).  2. Right ventricular systolic function is normal. The right ventricular size is normal.  3. Left atrial size was mildly dilated.  4.  The mitral valve is normal in structure. Trivial mitral valve regurgitation. No evidence of mitral stenosis.  5. The aortic valve is normal in structure. There is mild calcification of the aortic valve. Aortic valve regurgitation is not visualized. Mild to moderate aortic valve sclerosis/calcification is present, without any evidence of aortic stenosis.  6. The inferior vena cava is normal in size with greater than 50% respiratory variability, suggesting right atrial pressure of 3 mmHg. FINDINGS  Left Ventricle: Left ventricular ejection fraction, by estimation, is 55 to 60%. The left ventricle has normal function. The left ventricle has no regional wall motion abnormalities. The left ventricular internal cavity size was normal in size. There is  no left ventricular hypertrophy. Left ventricular diastolic parameters are consistent with Grade II diastolic dysfunction (pseudonormalization). Right Ventricle: The right ventricular size is normal. No increase in right ventricular wall thickness. Right ventricular systolic function is normal. Left Atrium: Left atrial size was mildly dilated. Right Atrium: Right atrial size was normal in size. Pericardium: There is no evidence of pericardial effusion. Mitral Valve: The mitral valve is normal in structure. There is mild  thickening of the mitral valve leaflet(s). Trivial mitral valve regurgitation. No evidence of mitral valve stenosis. Tricuspid Valve: The tricuspid valve is normal in structure. Tricuspid valve regurgitation is trivial. No evidence of tricuspid stenosis. Aortic Valve: The aortic valve is normal in structure. There is mild calcification of the aortic valve. Aortic valve regurgitation is not visualized. Mild to moderate aortic valve sclerosis/calcification is present, without any evidence of aortic stenosis. Aortic valve mean gradient measures 4.0 mmHg. Aortic valve peak gradient measures 8.5 mmHg. Aortic valve area, by VTI measures 2.22 cm. Pulmonic Valve: The pulmonic valve was normal in structure. Pulmonic valve regurgitation is not visualized. No evidence of pulmonic stenosis. Aorta: The aortic root is normal in size and structure. Venous: The inferior vena cava is normal in size with greater than 50% respiratory variability, suggesting right atrial pressure of 3 mmHg. IAS/Shunts: No atrial level shunt detected by color flow Doppler.  LEFT VENTRICLE PLAX 2D LVIDd:         3.90 cm  Diastology LVIDs:         2.70 cm  LV e' medial:    8.59 cm/s LV PW:         1.00 cm  LV E/e' medial:  10.1 LV IVS:        0.90 cm  LV e' lateral:   11.70 cm/s LVOT diam:     1.90 cm  LV E/e' lateral: 7.4 LV SV:         69 LV SV Index:   45 LVOT Area:     2.84 cm  RIGHT VENTRICLE             IVC RV Basal diam:  3.00 cm     IVC diam: 0.90 cm RV S prime:     12.10 cm/s TAPSE (M-mode): 2.0 cm LEFT ATRIUM             Index       RIGHT ATRIUM           Index LA diam:        3.20 cm 2.06 cm/m  RA Area:     12.40 cm LA Vol (A2C):   40.5 ml 26.08 ml/m RA Volume:   29.30 ml  18.87 ml/m LA Vol (A4C):   31.8 ml 20.48 ml/m LA Biplane Vol: 36.4 ml 23.44 ml/m  AORTIC VALVE AV Area (  Vmax):    2.47 cm AV Area (Vmean):   2.24 cm AV Area (VTI):     2.22 cm AV Vmax:           146.00 cm/s AV Vmean:          100.000 cm/s AV VTI:            0.313 m  AV Peak Grad:      8.5 mmHg AV Mean Grad:      4.0 mmHg LVOT Vmax:         127.00 cm/s LVOT Vmean:        78.900 cm/s LVOT VTI:          0.245 m LVOT/AV VTI ratio: 0.78  AORTA Ao Root diam: 2.60 cm MITRAL VALVE               TRICUSPID VALVE MV Area (PHT): 4.39 cm    TR Peak grad:   25.0 mmHg MV Decel Time: 173 msec    TR Vmax:        250.00 cm/s MV E velocity: 86.50 cm/s MV A velocity: 80.60 cm/s  SHUNTS MV E/A ratio:  1.07        Systemic VTI:  0.24 m                            Systemic Diam: 1.90 cm Glori Bickers MD Electronically signed by Glori Bickers MD Signature Date/Time: 08/06/2020/2:02:46 PM    Final     Microbiology: Recent Results (from the past 240 hour(s))  SARS CORONAVIRUS 2 (TAT 6-24 HRS) Nasopharyngeal Nasopharyngeal Swab     Status: None   Collection Time: 08/05/20  7:22 PM   Specimen: Nasopharyngeal Swab  Result Value Ref Range Status   SARS Coronavirus 2 NEGATIVE NEGATIVE Final    Comment: (NOTE) SARS-CoV-2 target nucleic acids are NOT DETECTED.  The SARS-CoV-2 RNA is generally detectable in upper and lower respiratory specimens during the acute phase of infection. Negative results do not preclude SARS-CoV-2 infection, do not rule out co-infections with other pathogens, and should not be used as the sole basis for treatment or other patient management decisions. Negative results must be combined with clinical observations, patient history, and epidemiological information. The expected result is Negative.  Fact Sheet for Patients: SugarRoll.be  Fact Sheet for Healthcare Providers: https://www.woods-mathews.com/  This test is not yet approved or cleared by the [place] FDA and  has been authorized for detection and/or diagnosis of SARS-CoV-2 by FDA under an Emergency Use Authorization (EUA). This EUA will remain  in effect (meaning this test can be used) for the duration of the COVID-19 declaration under Se ction  564(b)(1) of the Act, 21 U.S.C. section 360bbb-3(b)(1), unless the authorization is terminated or revoked sooner.  Performed at [facility], [place] [address] [place], [place] [address]      Labs: Basic Metabolic Panel: No results for input(s): NA, K, CL, CO2, GLUCOSE, BUN, CREATININE, CALCIUM, MG, PHOS in the last 168 hours. Liver Function Tests: No results for input(s): AST, ALT, ALKPHOS, BILITOT, PROT, ALBUMIN in the last 168 hours. No results for input(s): LIPASE, AMYLASE in the last 168 hours. No results for input(s): AMMONIA in the last 168 hours. CBC: Recent Labs  Lab 08/06/20 0027  WBC 7.6  HGB 10.7*  HCT 32.1*  MCV 101.9*  PLT 232   Cardiac Enzymes: No results for input(s): CKTOTAL, CKMB, CKMBINDEX, TROPONINI in the last  168 hours. BNP: BNP (last 3 results) Recent Labs    08/20/19 1604 09/09/19 1217  BNP 81.9 75.1    ProBNP (last 3 results) No results for input(s): PROBNP in the last 8760 hours.  CBG: No results for input(s): GLUCAP in the last 168 hours.     Signed:  Oswald Hillock MD.  [facility] 08/06/2020, 5:47 PM

## 2020-08-06 NOTE — TOC Benefit Eligibility Note (Signed)
Transition of Care [facility]) Benefit Eligibility Note    Patient Details  Name: Samantha Keller MRN: [id number] Date of Birth: Oct 06, 1945   Medication/Dose: ELIQUIS  2.5 MG BID  _CO-PAY- $40.00  and  ELIQUIS  5 MG BID CO-PAY- $40.00     Tier: 2 Drug  Prescription Coverage Preferred Pharmacy: [facility] , [facility]  and   [facility]  M/O  Spoke with Person/Company/Phone Number:: KEN  @ [employer] ZX #  [phone]  Co-Pay: $40.00  Prior Approval: No  Deductible: Unmet  Additional Notes: 90 DAY SUPPLY FOR M/O  $80.00    Memory Argue Phone Number: 08/06/2020, 4:18 PM

## 2020-08-06 NOTE — Discharge Instructions (Signed)
Take metoprolol tartrate 25mg  for HR persistently >120 and symptoms similar to presentation this admission

## 2020-08-06 NOTE — Consult Note (Addendum)
Cardiology Consultation:   Patient ID: DEAUNNA OLARTE MRN: [id number]; DOB: [birth year]  Admit date: 08/05/2020 Date of Consult: 08/06/2020  PCP:  [name], [place] [facility] Cardiologist:  [name], MD  Advanced Heart Failure:  Glori Bickers, MD       Patient Profile:   Samantha Keller is a 75 y.o. female with a PMH of chronic combined CHF/NICM, chronic LBBB, HTN, RA, asthma, depression, CVA/TIA, RA, and GERD with history of upper GIB 03/2019, who is being seen 08/06/2020 for the evaluation of new onset atrial fibrillation at the request of Dr. Marlowe Sax.  History of Present Illness:   Ms. Fiddler was in her usual state of health until the morning of 08/05/20 when she was riding in the car with her daughter in [place], [place] and developed chest heaviness and SOB. She had unsteadiness/dizziness upon trying to exit the car and also reported headache which is not out of the ordinary for her. She presented to a [facility] facility shortly after onset of symptoms and was found to be hypotensive with BP in the 60s/40s and tachycardic to the 150s. She was transported via EMS to [facility] for further evaluation where she was found to have atrial fibrillation with RVR. She was given IVF bolus with improvement in dizziness/SOB. Troponin was <0.012. She was started on a diltiazem gtt and transported to [facility] for further evaluation. EKG on arrival to [facility] showed sinus rhythm with chronic LBBB. Echocardiogram has been done with read pending.   There is mention of prior history of paroxysmal atrial fibrillation during an admission 03/2019 for MSSA bacteremia, however anticoagulation was not started at that time due to acute upper GIB complicating admission. She does have a history of NICM diagnosed 05/2019 with EF down to 20-25% on echo with mild-moderate MR, moderate TR, and elevated RVSP. She underwent a R/LHC at that time which showed no evidence of CAD, mild pulmHTN, good CO. She followed  outpatient with advance heart failure after discharge and was recommended to undergo a cardiac MR to further evaluate her cardiomyopathy. Cardiac MR 09/2019 showed improvement in EF to 58% without evidence of infiltrative/inflammatory intramyocardial process. She graduated from the HF clinic and has followed with Dr. Harrington Challenger. She was doing well at her last clinic visit 04/2020 and was continued on entresto.  At the time of this evaluation she notes improvement in her symptoms. Still with some mild chest discomfort but not to the extent that sent her to the ED. She wonders if her reflux is playing a roll. She was unaware of palpitations or racing heart beat sensations at the time but had profound dizziness, chest pain, and SOB. She has DOE which is at baseline but no significant LE edema, orthopnea, PND, chest pain, palpitations, dizziness, lightheadedness, or syncope.  Past Medical History:  Diagnosis Date  . Abscess of bursa of right hip   . Acute kidney failure ([place])   . Alcohol withdrawal ([place])   . Asthma   . Cellulitis of unspecified part of limb   . Chronic tension type headache   . Depression   . Dysphagia, oropharyngeal phase   . Essential (primary) hypertension   . Hyperosmolality and/or hypernatremia   . Hypovolemic shock ([place])   . Iron deficiency anemia   . LBBB (left bundle branch block)   . Malignant neoplasm of upper lobe bronchus, left ([place])   . Metabolic encephalopathy   . MSSA bacteremia 2021  . Muscle weakness (generalized)   .  Peritoneal abscess ([place])   . Pneumonia   . RA (rheumatoid arthritis) ([place])   . Respiratory failure ([place])   . Staph infection   . UGIB (upper gastrointestinal bleed) 2021  . Unspecified protein-calorie malnutrition ([place])     Past Surgical History:  Procedure Laterality Date  . ABCESS DRAINAGE Bilateral 2021  . ABDOMINAL HYSTERECTOMY    . BREAST ENHANCEMENT SURGERY    . CERVICAL FUSION    . COLONOSCOPY     about 5 years ago. Been doing  cologuard  . ESOPHAGOGASTRODUODENOSCOPY     [place]   . EYE SURGERY Bilateral    lens replacement  . INCISION AND DRAINAGE OF WOUND Bilateral 2021   hip, thigh  . IR THORACENTESIS ASP PLEURAL SPACE W/IMG GUIDE  06/20/2019  . LUMBAR FUSION    . RIGHT/LEFT HEART CATH AND CORONARY ANGIOGRAPHY N/A 06/27/2019   Procedure: RIGHT/LEFT HEART CATH AND CORONARY ANGIOGRAPHY;  Surgeon: Martinique, Peter M, MD;  Location: [facility];  Service: Cardiovascular;  Laterality: N/A;  . TONSILLECTOMY       Home Medications:  Prior to Admission medications   Medication Sig Start Date End Date Taking? Authorizing Provider  Ascorbic Acid (VITAMIN C) 500 MG CAPS Take 500 mg by mouth daily.   Yes [provider]  cycloSPORINE (RESTASIS) 0.05 % ophthalmic emulsion Place 1 drop into both eyes 2 (two) times daily.    Yes [provider]  DULoxetine (CYMBALTA) 60 MG capsule Take 60 mg by mouth daily.   Yes [provider]  estradiol (CLIMARA - DOSED IN MG/24 HR) 0.0375 mg/24hr patch Place 0.0375 mg onto the skin as directed. Apply one patch to skin two times a week on sundays and wednesdays   Yes [provider]  ferrous sulfate 325 (65 FE) MG tablet Take 325 mg by mouth daily with breakfast.   Yes [provider]  folic acid (FOLVITE) 1 MG tablet Take 2 mg by mouth daily.   Yes [provider]  gabapentin (NEURONTIN) 600 MG tablet Take 600 mg by mouth 3 (three) times daily.   Yes [provider]  melatonin 5 MG TABS Take 5 mg by mouth at bedtime.   Yes [provider]  methotrexate (RHEUMATREX) 2.5 MG tablet Take 10 mg by mouth once a week. 04/21/20  Yes [provider]  pantoprazole (PROTONIX) 40 MG tablet Take 40 mg by mouth daily.    Yes [provider]  sacubitril-valsartan (ENTRESTO) 24-26 MG Take 1 tablet by mouth 2 (two) times daily. 02/09/20  Yes Fay Records, MD  traZODone (DESYREL) 50 MG  tablet Take 50 mg by mouth at bedtime. 12/10/19  Yes [provider]    Inpatient Medications: Scheduled Meds: . ascorbic acid  500 mg Oral Daily  . cycloSPORINE  1 drop Both Eyes BID  . DULoxetine  60 mg Oral Daily  . [START ON 08/07/2020] ferrous sulfate  325 mg Oral Q breakfast  . folic acid  2 mg Oral Daily  . gabapentin  600 mg Oral TID  . melatonin  5 mg Oral QHS  . pantoprazole  40 mg Oral Daily  . sacubitril-valsartan  1 tablet Oral BID  . traZODone  50 mg Oral QHS   Continuous Infusions: . diltiazem (CARDIZEM) infusion     PRN Meds: acetaminophen  Allergies:    Allergies  Allergen Reactions  . Iodinated Diagnostic Agents Anaphylaxis  . Shellfish Allergy Anaphylaxis  . Shellfish-Derived Products  Anaphylaxis    Social History:   Social History   Socioeconomic History  . Marital status: Divorced    Spouse name: Not on file  . Number of children: 2  . Years of education: Not on file  . Highest education level: Not on file  Occupational History  . Not on file  Tobacco Use  . Smoking status: Former Smoker    Packs/day: 2.00    Years: 20.00    Pack years: 40.00    Types: Cigarettes    Quit date: 06/05/1982    Years since quitting: 38.1  . Smokeless tobacco: Never Used  Vaping Use  . Vaping Use: Never used  Substance and Sexual Activity  . Alcohol use: Yes    Alcohol/week: 1.0 standard drink    Types: 1 Glasses of wine per week    Comment: 1 glass per night  . Drug use: Never  . Sexual activity: Not Currently  Other Topics Concern  . Not on file  Social History Narrative  . Not on file   Social Determinants of Health   Financial Resource Strain: Not on file  Food Insecurity: Not on file  Transportation Needs: Not on file  Physical Activity: Not on file  Stress: Not on file  Social Connections: Not on file  Intimate Partner Violence: Not on file    Family History:    Family History  Problem Relation Age of Onset  . Asthma Mother   .  Asthma Father   . COPD Father   . CAD Father   . Asthma Sister   . Breast cancer Paternal Aunt   . High Cholesterol Brother   . Colon cancer Neg Hx      ROS:  Please see the history of present illness.   All other ROS reviewed and negative.     Physical Exam/Data:   Vitals:   08/06/20 0405 08/06/20 0700 08/06/20 0858 08/06/20 1537  BP: (!) 112/55  (!) 105/50 105/77  Pulse: 63  63   Resp: 16   16  Temp: 98 F (36.7 C)  97.8 F (36.6 C)   TempSrc: Oral  Oral   SpO2: 98%  100%   Weight: 55.6 kg     Height:  5\' 2"  (1.575 m)      Intake/Output Summary (Last 24 hours) at 08/06/2020 1727 Last data filed at 08/06/2020 1327 Gross per 24 hour  Intake 1086.1 ml  Output 800 ml  Net 286.1 ml   Last 3 Weights 08/06/2020 08/05/2020 05/18/2020  Weight (lbs) 122 lb 9.6 oz 125 lb 3.5 oz 123 lb 9.6 oz  Weight (kg) 55.611 kg 56.8 kg 56.065 kg     Body mass index is 22.42 kg/m.  General:  Well nourished, well developed, in no acute distress HEENT: sclera anicteric Lymph: no adenopathy Neck: no JVD Endocrine:  No thryomegaly Vascular: No carotid bruits; distal pulses 2+ bilaterally  Cardiac:  normal S1, S2; RRR; no murmurs, rubs, or gallops Lungs:  clear to auscultation bilaterally, no wheezing, rhonchi or rales  Abd: soft, nontender, no hepatomegaly  Ext: no edema Musculoskeletal:  No deformities, BUE and BLE strength normal and equal Skin: warm and dry  Neuro:  CNs 2-12 intact, no focal abnormalities noted Psych:  Normal affect   EKG:  The EKG was personally reviewed and demonstrates:  Sinus rhythm with chronic LBBB, rate 68 bpm Telemetry:  Telemetry was personally reviewed and demonstrates:  Atrial fibrillation with RVR with conversion to sinus rhythm around 9:15pm  last night.  Relevant CV Studies:  Cardiac MR 09/2019: 1. Normal left ventricular size, thickness and systolic function (LVEF = 58% with paradoxical septal motion consistent with LBBB. There is no late gadolinium  enhancement in the left ventricular myocardium. ECV 28%.  2. Normal right ventricular size, thickness and systolic function (RVEF = 61%). There are no regional wall motion abnormalities.  3.  Normal left and right atrial size.  4. Normal size of the aortic root, ascending aorta and pulmonary artery.  5.  Mild mitral and tricuspid regurgitation.  6.  Normal pericardium.  Trivial pericardial effusion.  Since the echocardiogram on 06/20/2019 there has been improvement of left and right ventricular function, both currently at the normal range. There is no evidence for infiltrative of inflammatory intramyocardial process.   [facility] 06/2019  There is moderate to severe left ventricular systolic dysfunction.  LV end diastolic pressure is mildly elevated.  Hemodynamic findings consistent with mild pulmonary hypertension.   1. Normal coronary anatomy 2. Moderate to severe LV dysfunction. EF estimated at 35% 3. Mildly elevated LV filling pressures.  4. Mild pulmonary HTN 5. Good cardiac output. Index 4.1  Plan: medical management  Echocardiogram 05/2019: 1. Left ventricular ejection fraction, by estimation, is 20 to 25%. The  left ventricle has severely decreased function. The left ventricle  demonstrates global hypokinesis. Indeterminate diastolic filling due to  E-A fusion.  2. Right ventricular systolic function is mildly reduced. The right  ventricular size is moderately enlarged. There is moderately elevated  pulmonary artery systolic pressure. The estimated right ventricular  systolic pressure is 65.6 mmHg.  3. Right atrial size was mild to moderately dilated.  4. The mitral valve is grossly normal. Mild to moderate mitral valve  regurgitation.  5. Tricuspid valve regurgitation is moderate.  6. The aortic valve is tricuspid. Aortic valve regurgitation is not  visualized. No aortic stenosis is present.  7. The inferior vena cava is dilated in size with <50%  respiratory  variability, suggesting right atrial pressure of 15 mmHg.   Laboratory Data:  High Sensitivity Troponin:   Recent Labs  Lab 08/05/20 2123 08/06/20 0027 08/06/20 0336  TROPONINIHS 203* 195* 170*     ChemistryNo results for input(s): NA, K, CL, CO2, GLUCOSE, BUN, CREATININE, CALCIUM, GFRNONAA, GFRAA, ANIONGAP in the last 168 hours.  No results for input(s): PROT, ALBUMIN, AST, ALT, ALKPHOS, BILITOT in the last 168 hours. Hematology Recent Labs  Lab 08/06/20 0027  WBC 7.6  RBC 3.15*  HGB 10.7*  HCT 32.1*  MCV 101.9*  MCH 34.0  MCHC 33.3  RDW 15.2  PLT 232   BNPNo results for input(s): BNP, PROBNP in the last 168 hours.  DDimer No results for input(s): DDIMER in the last 168 hours.   Radiology/Studies:  ECHOCARDIOGRAM COMPLETE  Result Date: 08/06/2020    ECHOCARDIOGRAM REPORT   Patient Name:   Samantha Keller Date of Exam: 08/06/2020 Medical Rec #:  [id number]      Height:       62.0 in Accession #:    [id number]     Weight:       122.6 lb Date of Birth:  [birth year]       BSA:          1.553 m Patient Age:    68 years       BP:           112/55 mmHg Patient Gender: F  HR:           63 bpm. Exam Location:  Inpatient Procedure: 2D Echo, Cardiac Doppler and Color Doppler Indications:    Atrial fibrillation  History:        Patient has prior history of Echocardiogram examinations, most                 recent 06/20/2019. TIA; Risk Factors:Hypertension.  Sonographer:    Cammy Brochure Referring Phys: [id number] [place]  1. Left ventricular ejection fraction, by estimation, is 55 to 60%. The left ventricle has normal function. The left ventricle has no regional wall motion abnormalities. Left ventricular diastolic parameters are consistent with Grade II diastolic dysfunction (pseudonormalization).  2. Right ventricular systolic function is normal. The right ventricular size is normal.  3. Left atrial size was mildly dilated.  4. The mitral valve is  normal in structure. Trivial mitral valve regurgitation. No evidence of mitral stenosis.  5. The aortic valve is normal in structure. There is mild calcification of the aortic valve. Aortic valve regurgitation is not visualized. Mild to moderate aortic valve sclerosis/calcification is present, without any evidence of aortic stenosis.  6. The inferior vena cava is normal in size with greater than 50% respiratory variability, suggesting right atrial pressure of 3 mmHg. FINDINGS  Left Ventricle: Left ventricular ejection fraction, by estimation, is 55 to 60%. The left ventricle has normal function. The left ventricle has no regional wall motion abnormalities. The left ventricular internal cavity size was normal in size. There is  no left ventricular hypertrophy. Left ventricular diastolic parameters are consistent with Grade II diastolic dysfunction (pseudonormalization). Right Ventricle: The right ventricular size is normal. No increase in right ventricular wall thickness. Right ventricular systolic function is normal. Left Atrium: Left atrial size was mildly dilated. Right Atrium: Right atrial size was normal in size. Pericardium: There is no evidence of pericardial effusion. Mitral Valve: The mitral valve is normal in structure. There is mild thickening of the mitral valve leaflet(s). Trivial mitral valve regurgitation. No evidence of mitral valve stenosis. Tricuspid Valve: The tricuspid valve is normal in structure. Tricuspid valve regurgitation is trivial. No evidence of tricuspid stenosis. Aortic Valve: The aortic valve is normal in structure. There is mild calcification of the aortic valve. Aortic valve regurgitation is not visualized. Mild to moderate aortic valve sclerosis/calcification is present, without any evidence of aortic stenosis. Aortic valve mean gradient measures 4.0 mmHg. Aortic valve peak gradient measures 8.5 mmHg. Aortic valve area, by VTI measures 2.22 cm. Pulmonic Valve: The pulmonic valve  was normal in structure. Pulmonic valve regurgitation is not visualized. No evidence of pulmonic stenosis. Aorta: The aortic root is normal in size and structure. Venous: The inferior vena cava is normal in size with greater than 50% respiratory variability, suggesting right atrial pressure of 3 mmHg. IAS/Shunts: No atrial level shunt detected by color flow Doppler.  LEFT VENTRICLE PLAX 2D LVIDd:         3.90 cm  Diastology LVIDs:         2.70 cm  LV e' medial:    8.59 cm/s LV PW:         1.00 cm  LV E/e' medial:  10.1 LV IVS:        0.90 cm  LV e' lateral:   11.70 cm/s LVOT diam:     1.90 cm  LV E/e' lateral: 7.4 LV SV:         69 LV SV Index:  45 LVOT Area:     2.84 cm  RIGHT VENTRICLE             IVC RV Basal diam:  3.00 cm     IVC diam: 0.90 cm RV S prime:     12.10 cm/s TAPSE (M-mode): 2.0 cm LEFT ATRIUM             Index       RIGHT ATRIUM           Index LA diam:        3.20 cm 2.06 cm/m  RA Area:     12.40 cm LA Vol (A2C):   40.5 ml 26.08 ml/m RA Volume:   29.30 ml  18.87 ml/m LA Vol (A4C):   31.8 ml 20.48 ml/m LA Biplane Vol: 36.4 ml 23.44 ml/m  AORTIC VALVE AV Area (Vmax):    2.47 cm AV Area (Vmean):   2.24 cm AV Area (VTI):     2.22 cm AV Vmax:           146.00 cm/s AV Vmean:          100.000 cm/s AV VTI:            0.313 m AV Peak Grad:      8.5 mmHg AV Mean Grad:      4.0 mmHg LVOT Vmax:         127.00 cm/s LVOT Vmean:        78.900 cm/s LVOT VTI:          0.245 m LVOT/AV VTI ratio: 0.78  AORTA Ao Root diam: 2.60 cm MITRAL VALVE               TRICUSPID VALVE MV Area (PHT): 4.39 cm    TR Peak grad:   25.0 mmHg MV Decel Time: 173 msec    TR Vmax:        250.00 cm/s MV E velocity: 86.50 cm/s MV A velocity: 80.60 cm/s  SHUNTS MV E/A ratio:  1.07        Systemic VTI:  0.24 m                            Systemic Diam: 1.90 cm Glori Bickers MD Electronically signed by Glori Bickers MD Signature Date/Time: 08/06/2020/2:02:46 PM    Final      Assessment and Plan:   1. Paroxysmal atrial  fibrillation: patient [facility] to [facility] with complaints of chest tightness, dizziness, and SOB. She was found to be hypotensive and tachycardic on arrival. EKG showed atrial fibrillation with RVR with rate in the 130s. She was given an IVF bolus and started on a diltiazem gtt with successful conversion to sinus rhythm. She has maintained sinus rhythm off diltiazem due to low BP's - Favor using prn metoprolol tartrate 25mg  for HR persistently >120 and symptoms similar to presentation this admission - Recommend starting eliquis 5mg  BID for CHA2DS2-VASc Score = 3 [CHF History: Yes, HTN History: No, Diabetes History: No, Stroke History: No, Vascular Disease History: No, Age Score: 1, Gender Score: 1].  Therefore, the patient's annual risk of stroke is 3.2 %.    - Would be reasonable to have outpatient EP evaluation to discuss treatment options given limitations of AV nodal blocking agents due to hypotension  2. Chronic combined CHF/NICM: patient has a history of NICM with EF down to 20-25% 05/2019 with recovery of EF to 58% on cardiac MR  09/2019. Etiology was unclear. Briefly seen by Dr. Haroldine Laws in the [place] clinic, though graduated to general cardiology. Echo this admission with EF 55-60%, G2DD, mild LAE, and trivial MR. GDMT has been limited by hypotension historically, though she has tolerated low dose entresto. She appears euvolemic on exam and does not describe any volume overload complaints - Continue entresto - Continue to limit salt intake and monitor daily weights.  3. Chest pain in patient without CAD: patient had chest pain in the setting of #1. No resolved. EKG non-ischemic. Trops were initially negative, though on repeat HsTrop here 203>195>170. LHC 06/2019 showed no evidence of CAD. Flat trop trend not c/w ACS and is likely demand ischemia.  - Continue rate control strategies as above    Risk Assessment/Risk Scores:   HEAR Score (for undifferentiated chest pain):  HEAR  Score: 4 New York Heart Association (NYHA) Functional Class NYHA Class II  CHA2DS2-VASc Score = 3  This indicates a 3.2% annual risk of stroke. The patient's score is based upon: CHF History: Yes HTN History: No Diabetes History: No Stroke History: No Vascular Disease History: No Age Score: 1 Gender Score: 1   Will send a message to scheduling to arrange outpatient follow-up.     For questions or updates, please contact [place] Please consult [email or web address] for contact info under    Signed, Abigail Butts, PA-C  08/06/2020 5:27 PM    Patient seen and examined. Agree with assessment and plan.  Samantha Keller is a very pleasant 75 year old female who has a history of prior nonischemic cardiomyopathy, chronic left bundle branch block, hypertension, rheumatoid arthritis, depression, GERD with history of remote CVA TIA and upper GI bleed in January 2021.  She had a prior history of PAF in January 2021 in the setting of bacteremia and at that time anticoagulation was not initiated due to acute upper GI bleed complicating her admission.  She was diagnosed with nonischemic cardiomyopathy in March 2021 with reduced EF at 20 to 25%, mild to moderate MR, moderate TR and elevated RV systolic pressure.  She was not found to have CAD on catheterization and had mild pulmonary hypertension.  Cardiac MRI showed improvement of LV function without late gadolinium enhancement and no evidence for infiltrative/inflammatory intramyocardial process.  He has been on low-dose Entresto at 24/26 mg twice daily she was admitted hospital yesterday and transferred from [facility] when she developed atrial fibrillation with RVR for which she was started on IV Cardizem.  She ultimately converted to sinus rhythm.  Following conversion to sinus rhythm, her blood pressure has remained low.  Diltiazem was discontinued.  A 2D echo Doppler study repeated today shows normal systolic function with EF 55 to  60% with grade 2 diastolic dysfunction, mild-moderate aortic sclerosis without stenosis, and only mild left atrial dilatation.  Presently she feels well but has noticed some GERD symptomatology which was initiated following her lunch.  She had taken a dose of pantoprazole with plus minus benefit.  She has no history of exertional chest tightness.  Blood pressure remains soft at approximately 100/60.  Pulse is regular in the 70s.  HEENT is unremarkable.  There is no JVD.  Lungs are relatively clear.  Rhythm is regular with a 2/6 systolic murmur most likely contributed by her aortic valve sclerosis.  There is no S3 gallop.  Abdomen was nontender.  There was no significant edema.  ECG demonstrates sinus rhythm with left bundle branch block.  Notable for hemoglobin 10.7 hematocrit  32.1 with mild macrocytic indices with an MCV of 101.  TSH is 3.2.  Troponins are mildly elevated with a flat plateau but most likely consistent with demand ischemia in the setting of recent AF with RVR.  Labs from [facility] showed creatinine of 1.4, potassium 4.3, magnesium 1.9 and platelet count 245,000.  With her recurrent AF patient will be started on anticoagulation with Eliquis.  She should have follow-up CBC as outpatient.  With her low blood pressure she may not be a candidate for daily treatment with Cardizem or metoprolol but recommend metoprolol titrate 25 mg on a as needed basis if palpitations occur.  Arrange for follow-up of evaluation as outpatient with the patient's cardiologist, Dr. [name].  She may also benefit from possible EP evaluation   Troy Sine, MD, [facility] 08/06/2020 5:39 PM

## 2020-08-06 NOTE — Plan of Care (Signed)
  Problem: Activity: Goal: Ability to tolerate increased activity will improve Outcome: Progressing   Problem: Cardiac: Goal: Ability to achieve and maintain adequate cardiopulmonary perfusion will improve Outcome: Progressing   Problem: Health Behavior/Discharge Planning: Goal: Ability to safely manage health-related needs after discharge will improve Outcome: Progressing

## 2020-08-06 NOTE — Progress Notes (Signed)
ANTICOAGULATION CONSULT NOTE - Follow Up Consult  Pharmacy Consult for Heparin Indication: atrial fibrillation  Allergies  Allergen Reactions  . Iodinated Diagnostic Agents Anaphylaxis  . Shellfish Allergy Anaphylaxis  . Shellfish-Derived Products Anaphylaxis    Patient Measurements: Height: 5\' 2"  (157.5 cm) Weight: 55.6 kg (122 lb 9.6 oz) IBW/kg (Calculated) : 50.1 Heparin Dosing Weight: 55.6 kg  Vital Signs: Temp: 98 F (36.7 C) (05/13 0405) Temp Source: Oral (05/13 0405) BP: 112/55 (05/13 0405) Pulse Rate: 63 (05/13 0405)  Labs: Recent Labs    08/05/20 2123 08/06/20 0027 08/06/20 0336 08/06/20 0607  HGB  --  10.7*  --   --   HCT  --  32.1*  --   --   PLT  --  232  --   --   HEPARINUNFRC  --   --   --  0.39  TROPONINIHS 203* 195* 170*  --     CrCl cannot be calculated (Patient's most recent lab result is older than the maximum 21 days allowed.).  Assessment: 58 yoF admitted from OSH with AFib RVR. Pt on diltiazem drip but no anticoagulation and none PTA. Pt has hx GIB last year.   Initial heparin level is therapeutic (0.39) on 850 units/hr.  Goal of Therapy:  Heparin level 0.3-0.7 units/ml Monitor platelets by anticoagulation protocol: Yes   Plan:   Continue heparin drip at 850 units/hr  Daily heparin level and CBC.  Follow up anticoagulation plans.  Arty Baumgartner, RPh 08/06/2020,8:00 AM

## 2020-08-06 NOTE — Progress Notes (Signed)
Troponin 203, MD aware, will continue to monitor, Thanks Arvella Nigh RN

## 2020-08-06 NOTE — Plan of Care (Signed)
  Problem: Education: Goal: Knowledge of disease or condition will improve Outcome: Progressing   

## 2020-08-09 MED ORDER — SACUBITRIL-VALSARTAN 24-26 MG PO TABS: 1.0000 | ORAL_TABLET | Freq: Two times a day (BID) | ORAL | 1 refills | Status: DC

## 2020-08-09 NOTE — Telephone Encounter (Signed)
Reviewed cardiology not from Dr. Claiborne Billings at hospital on Friday 5/13.  I called the patient and scheduled her with Dr.Ross on 5/20.  Refilled Entresto. Dc instruction indicate follow up at [facility].  I adv pt for now to plan to keep appt with Dr. Harrington Challenger.

## 2020-08-13 ENCOUNTER — Other Ambulatory Visit: Payer: Self-pay

## 2020-08-13 ENCOUNTER — Ambulatory Visit: Payer: Medicare PPO | Admitting: Internal Medicine

## 2020-08-13 ENCOUNTER — Encounter: Payer: Self-pay | Admitting: Internal Medicine

## 2020-08-13 VITALS — BP 100/54 | HR 72 | Ht 62.0 in | Wt 122.0 lb

## 2020-08-13 DIAGNOSIS — I480 Paroxysmal atrial fibrillation: Secondary | ICD-10-CM

## 2020-08-13 DIAGNOSIS — R002 Palpitations: Secondary | ICD-10-CM

## 2020-08-13 NOTE — Progress Notes (Signed)
Cardiology Office Note   Date:  08/13/2020   ID:  Samantha, Keller Aug 02, 1945, MRN [id number]  PCP:  [name], PA-C  Cardiologist:   [name], MD    Pt presents for reeval of NICM, HTN    History of Present Illness: Samantha Keller is a 75 y.o. female with a history ofNICM, LBBB, HTN, GI bleeding, PAF  and RA  She was admitted to [facility] in march 2021 with CHF  Echo showed LVEF was 20 to 25%  Cardiac cath showed normal coronary arteries     The pt was initially  seen by [name].  Cardiac MRI in July 2021 showed LVEF 58% with no LGE   RVEF normal  I saw the pt in clinic in Feb 2022 on May 12 she was in [place] at a gas station    She developed chest heaviness, SOB    Tried to get out of the car and got dizzy   Developed HA    WWent to bathroom but still SOB    Went to ED  Found to be in Afib with RVR    She was initially at [facility]   Hypotensive    Given IV fluids   Placed on Cardiazem gtt and transferred to [facility]    The pt converted back to SR   Started on Eliquis  ([id number] 3)   Troponin was elevated in 100s   Rel flat   Felt due to demand ischemia in setting of high HR and low BP    Echo showed LVEF 50 to 60%  Keep on Entresto    Since d/c she has had no further palpitatoins   Breathing is OK   No dizzeinss  Walking     She wonders what to do if she has more afib    Current Meds  Medication Sig  . apixaban (ELIQUIS) 5 MG TABS tablet Take 1 tablet (5 mg total) by mouth 2 (two) times daily.  . Ascorbic Acid (VITAMIN C) 500 MG CAPS Take 500 mg by mouth daily.  . cycloSPORINE (RESTASIS) 0.05 % ophthalmic emulsion Place 1 drop into both eyes 2 (two) times daily.   . DULoxetine (CYMBALTA) 60 MG capsule Take 60 mg by mouth daily.  Marland Kitchen estradiol (CLIMARA - DOSED IN MG/24 HR) 0.0375 mg/24hr patch Place 0.0375 mg onto the skin as directed. Apply one patch to skin two times a week on sundays and wednesdays  . ferrous sulfate 325 (65 FE) MG tablet Take  325 mg by mouth daily with breakfast.  . folic acid (FOLVITE) 1 MG tablet Take 2 mg by mouth daily.  Marland Kitchen gabapentin (NEURONTIN) 600 MG tablet Take 600 mg by mouth 3 (three) times daily.  . melatonin 5 MG TABS Take 5 mg by mouth at bedtime.  . methotrexate (RHEUMATREX) 2.5 MG tablet Take 10 mg by mouth once a week.  . metoprolol tartrate (LOPRESSOR) 25 MG tablet Take 1 tablet (25 mg total) by mouth as needed (Palpitations).  . pantoprazole (PROTONIX) 40 MG tablet Take 40 mg by mouth daily.   . sacubitril-valsartan (ENTRESTO) 24-26 MG Take 1 tablet by mouth 2 (two) times daily.  . traZODone (DESYREL) 50 MG tablet Take 50 mg by mouth at bedtime.     Allergies:   Iodinated diagnostic agents, Shellfish allergy, and Shellfish-derived products   Past Medical History:  Diagnosis Date  . Abscess of bursa of right hip   . Acute kidney failure (  [place])   . Alcohol withdrawal ([place])   . Asthma   . Cellulitis of unspecified part of limb   . Chronic tension type headache   . Depression   . Dysphagia, oropharyngeal phase   . Essential (primary) hypertension   . Hyperosmolality and/or hypernatremia   . Hypovolemic shock ([place])   . Iron deficiency anemia   . LBBB (left bundle branch block)   . Malignant neoplasm of upper lobe bronchus, left ([place])   . Metabolic encephalopathy   . MSSA bacteremia 2021  . Muscle weakness (generalized)   . Peritoneal abscess ([place])   . Pneumonia   . RA (rheumatoid arthritis) ([place])   . Respiratory failure ([place])   . Staph infection   . UGIB (upper gastrointestinal bleed) 2021  . Unspecified protein-calorie malnutrition ([place])     Past Surgical History:  Procedure Laterality Date  . ABCESS DRAINAGE Bilateral 2021  . ABDOMINAL HYSTERECTOMY    . BREAST ENHANCEMENT SURGERY    . CERVICAL FUSION    . COLONOSCOPY     about 5 years ago. Been doing cologuard  . ESOPHAGOGASTRODUODENOSCOPY     [place]   . EYE SURGERY Bilateral    lens replacement   . INCISION AND DRAINAGE OF WOUND Bilateral 2021   hip, thigh  . IR THORACENTESIS ASP PLEURAL SPACE W/IMG GUIDE  06/20/2019  . LUMBAR FUSION    . RIGHT/LEFT HEART CATH AND CORONARY ANGIOGRAPHY N/A 06/27/2019   Procedure: RIGHT/LEFT HEART CATH AND CORONARY ANGIOGRAPHY;  Surgeon: Martinique, Peter M, MD;  Location: [facility];  Service: Cardiovascular;  Laterality: N/A;  . TONSILLECTOMY       Social History:  The patient  reports that she quit smoking about 38 years ago. Her smoking use included cigarettes. She has a 40.00 pack-year smoking history. She has never used smokeless tobacco. She reports current alcohol use of about 1.0 standard drink of alcohol per week. She reports that she does not use drugs.   Family History:  The patient's family history includes Asthma in her father, mother, and sister; Breast cancer in her paternal aunt; CAD in her father; COPD in her father; High Cholesterol in her brother.    ROS:  Please see the history of present illness. All other systems are reviewed and  Negative to the above problem except as noted.    PHYSICAL EXAM: VS:  BP (!) 100/54   Pulse 72   Ht 5\' 2"  (1.575 m)   Wt 122 lb (55.3 kg)   SpO2 95%   BMI 22.31 kg/m   GEN: Well nourished, well developed, in no acute distress  HEENT: normal  Neck: no JVD, carotid bruits Cardiac: RRR; no murmurs .  No  LE edema  Respiratory:  clear to auscultation bilaterally,  GI: soft, nontender, nondistended, + BS  No hepatomegaly  MS: no deformity Moving all extremities   Skin: warm and dry, no rash Neuro:  Strength and sensation are intact Psych: euthymic mood, full affect   EKG:  EKG is not ordered today.   Lipid Panel No results found for: CHOL, TRIG, HDL, CHOLHDL, VLDL, LDLCALC, LDLDIRECT    Wt Readings from Last 3 Encounters:  08/13/20 122 lb (55.3 kg)  08/06/20 122 lb 9.6 oz (55.6 kg)  05/18/20 123 lb 9.6 oz (56.1 kg)      ASSESSMENT AND PLAN:  1  PAF   REcent spell   Now on  anticoagulation   I would continue. If  she has recurrence would consider   Will set up for a 2 wk Zio patch   Follow  2   Hx NICM  No CAD  Pt had MRI in July 2021 that showed normalization of LVEF    Keep on Entresto  2  Hx HTN    BP is 100/   She is not dizzy   Follow      3 CP  Pt currently denies     4  Lpids   WIll need to be followed   Current medicines are reviewed at length with the patient today.  The patient does not have concerns regarding medicines.  Signed, [name], MD  08/13/2020 4:06 PM    [facility] [address], [place], [place]  [address] Phone: [phone]; Fax: [phone]

## 2020-08-13 NOTE — Patient Instructions (Signed)
Medication Instructions:  No changes *If you need a refill on your cardiac medications before your next appointment, please call your pharmacy*   Lab Work: In July - please return for labs (CBC)  If you have labs (blood work) drawn today and your tests are completely normal, you will receive your results only by: Marland Kitchen MyChart Message (if you have MyChart) OR . A paper copy in the mail If you have any lab test that is abnormal or we need to change your treatment, we will call you to review the results.   Testing/Procedures: [place]- Long Term Monitor Instructions   Your physician has requested you wear a ZIO patch monitor for 14 days.  This is a single patch monitor.   [employer] supplies one patch monitor per enrollment. Additional stickers are not available. Please do not apply patch if you will be having a Nuclear Stress Test, Echocardiogram, Cardiac CT, MRI, or Chest Xray during the period you would be wearing the monitor. The patch cannot be worn during these tests. You cannot remove and re-apply the ZIO XT patch monitor.  Your ZIO patch monitor will be sent Fed Ex from [employer] directly to your home address. It may take 3-5 days to receive your monitor after you have been enrolled.  Once you have received your monitor, please review the enclosed instructions. Your monitor has already been registered assigning a specific monitor serial # to you.  Billing and Patient Assistance Program Information   We have supplied [employer] with any of your insurance information on file for billing purposes. [employer] offers a sliding scale Patient Assistance Program for patients that do not have insurance, or whose insurance does not completely cover the cost of the ZIO monitor.   You must apply for the Patient Assistance Program to qualify for this discounted rate.     To apply, please call [employer] at [phone], select option 4, then select option 2, and ask to apply for Patient Assistance  Program.  Theodore Demark will ask your household income, and how many people are in your household.  They will quote your out-of-pocket cost based on that information.  [employer] will also be able to set up a 49-month, interest-free payment plan if needed.  Applying the monitor    Hold abrader disc by orange tab. Rub abrader in 40 strokes over the upper left chest as indicated in your monitor instructions.  Clean area with 4 enclosed alcohol pads. Let dry.  Apply patch as indicated in monitor instructions. Patch will be placed under collarbone on left side of chest with arrow pointing upward.  Rub patch adhesive wings for 2 minutes. Remove white label marked "1". Remove the white label marked "2". Rub patch adhesive wings for 2 additional minutes.  While looking in a mirror, press and release button in center of patch. A small green light will flash 3-4 times. This will be your only indicator that the monitor has been turned on. ?  Do not shower for the first 24 hours. You may shower after the first 24 hours.  Press the button if you feel a symptom. You will hear a small click. Record Date, Time and Symptom in the Patient Logbook.  When you are ready to remove the patch, follow instructions on the last 2 pages of the Patient Logbook. Stick patch monitor onto the last page of Patient Logbook.  Place Patient Logbook in the blue and white box.  Use locking tab on box and tape box closed securely.  The blue and white box has prepaid postage on it. Please place it in the mailbox as soon as possible. Your physician should have your test results approximately 7 days after the monitor has been mailed back to [facility].  Call [facility] at [phone] if you have questions regarding your ZIO XT patch monitor. Call them immediately if you see an orange light blinking on your monitor.  If your monitor falls off in less than 4 days, contact our Monitor department at [phone]. ?If your monitor  becomes loose or falls off after 4 days call [employer] at [phone] for suggestions on securing your monitor.?   Follow-Up: At [facility], you and your health needs are our priority.  As part of our continuing mission to provide you with exceptional heart care, we have created designated Provider Care Teams.  These Care Teams include your primary Cardiologist (physician) and Advanced Practice Providers (APPs -  Physician Assistants and Nurse Practitioners) who all work together to provide you with the care you need, when you need it.   Your next appointment:   5 month(s) (October)  The format for your next appointment:   In Person  Provider:   You may see [name], MD or one of the following Advanced Practice Providers on your designated Care Team:    Richardson Dopp, PA-C  Robbie Lis, [place]    Other Instructions

## 2020-08-16 ENCOUNTER — Ambulatory Visit (INDEPENDENT_AMBULATORY_CARE_PROVIDER_SITE_OTHER): Payer: Medicare PPO

## 2020-08-16 DIAGNOSIS — I480 Paroxysmal atrial fibrillation: Secondary | ICD-10-CM

## 2020-08-16 DIAGNOSIS — R002 Palpitations: Secondary | ICD-10-CM

## 2020-08-16 NOTE — Progress Notes (Unsigned)
14 day ZIO XT mailed to patients home.

## 2020-08-26 DIAGNOSIS — R002 Palpitations: Secondary | ICD-10-CM

## 2020-08-26 DIAGNOSIS — I480 Paroxysmal atrial fibrillation: Secondary | ICD-10-CM | POA: Diagnosis not present

## 2020-10-08 ENCOUNTER — Other Ambulatory Visit: Payer: Medicare PPO

## 2020-10-15 LAB — CBC
Hematocrit: 31.9 % — ABNORMAL LOW (ref 34.0–46.6)
Hemoglobin: 10.5 g/dL — ABNORMAL LOW (ref 11.1–15.9)
MCH: 33.7 pg — ABNORMAL HIGH (ref 26.6–33.0)
MCHC: 32.9 g/dL (ref 31.5–35.7)
MCV: 102 fL — ABNORMAL HIGH (ref 79–97)
Platelets: 177 10*3/uL (ref 150–450)
RBC: 3.12 x10E6/uL — ABNORMAL LOW (ref 3.77–5.28)
RDW: 13.5 % (ref 11.7–15.4)
WBC: 6.4 10*3/uL (ref 3.4–10.8)

## 2020-10-18 DIAGNOSIS — I480 Paroxysmal atrial fibrillation: Secondary | ICD-10-CM

## 2020-10-18 DIAGNOSIS — D649 Anemia, unspecified: Secondary | ICD-10-CM

## 2020-10-19 NOTE — Telephone Encounter (Signed)
She has had chronic mild decrease in hemoglobin (anemia) This may be related to her other medical problems   I am not convinced of active bleeding source Set up for anemia panel    THis should also be followed by PCP With atrial fibrillation I would recomm she stay on Eliquis

## 2020-11-24 ENCOUNTER — Other Ambulatory Visit: Payer: Self-pay | Admitting: Internal Medicine

## 2020-11-24 NOTE — Telephone Encounter (Signed)
Pt last saw Dr Harrington Challenger 08/13/20, last labs 07/19/20 Creat 1.07, age 75, weight 55.3kg, based on specified criteria pt is on appropriate dosage of Eliquis 5mg  BID for afib.  Will refill rx.

## 2021-01-14 IMAGING — CR DG LUMBAR SPINE 2-3V
3 series · 3 of 3 positions shown · non-contrast
Comparison: None.

CLINICAL DATA: Fall, injury. Fall while walking to toilet with
walker.

EXAM:
LUMBAR SPINE - 2-3 VIEW

[l-spine ap]
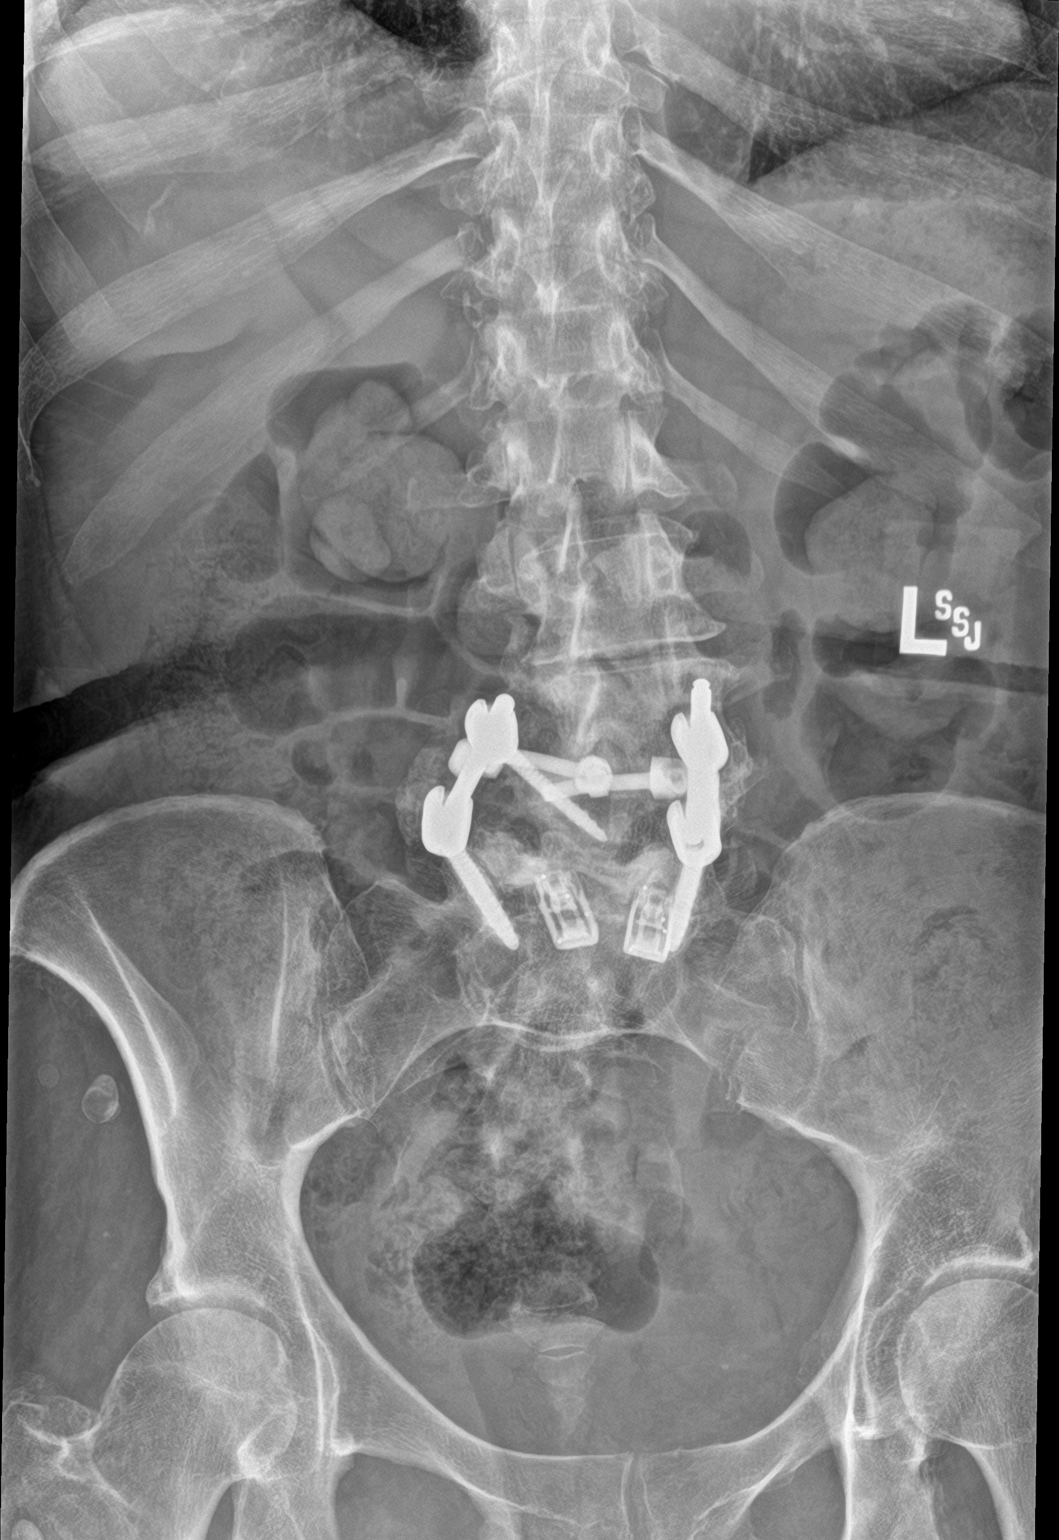

[l-spine lat]
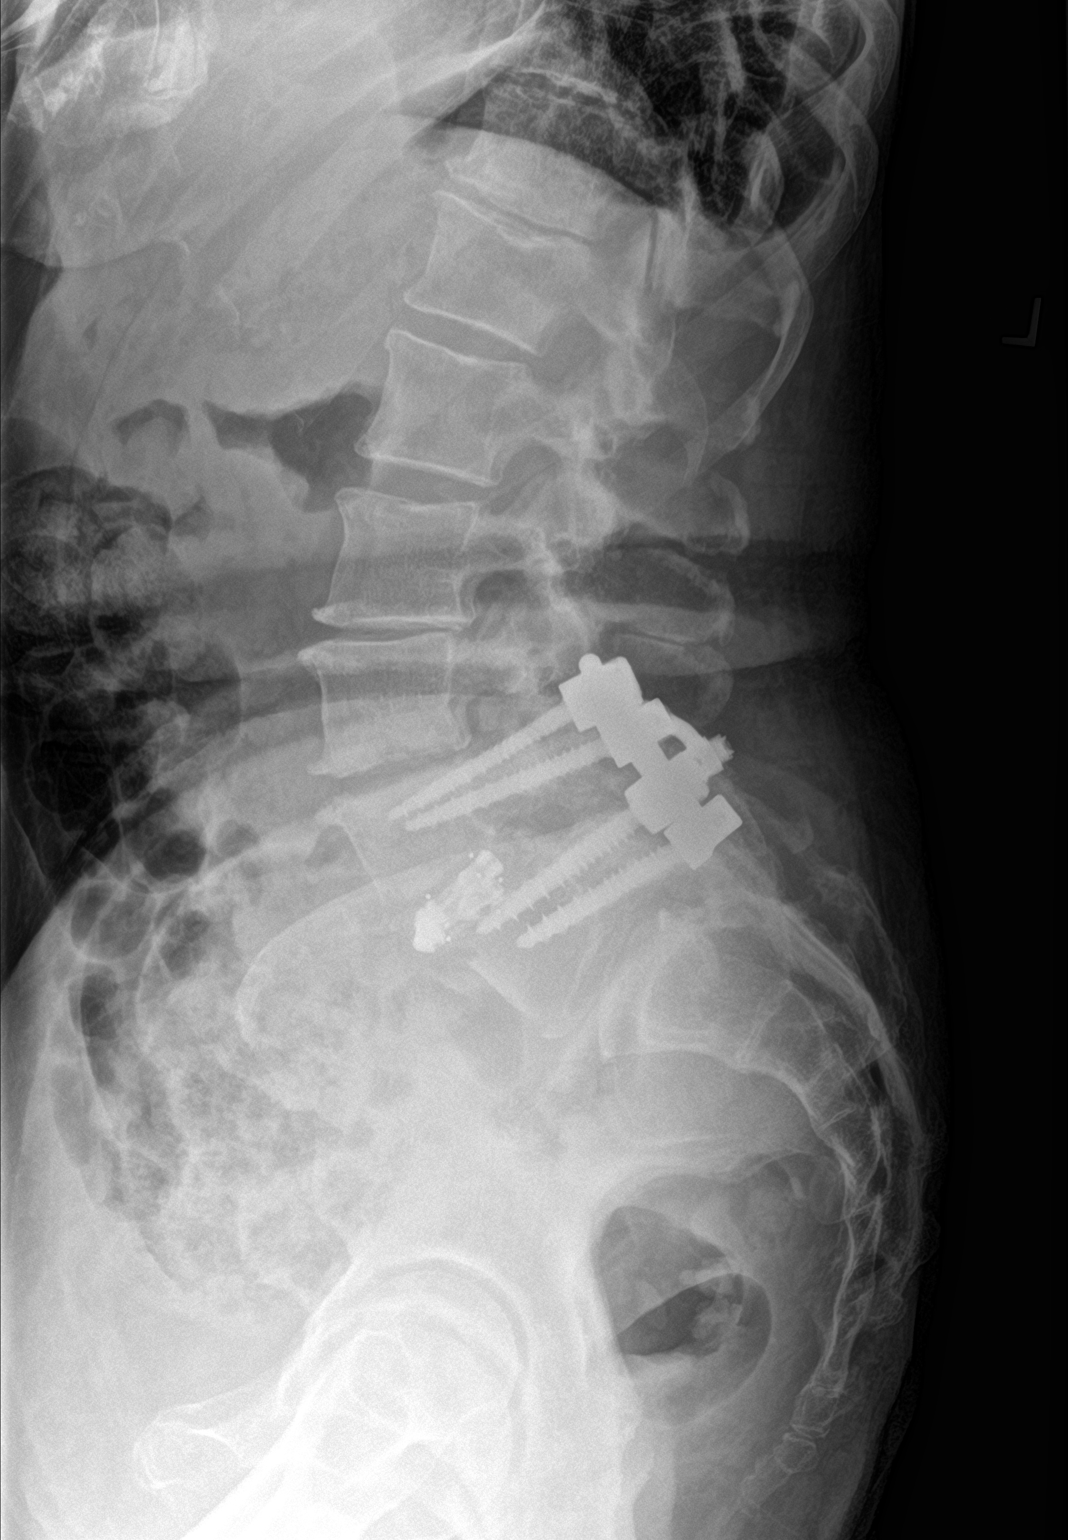

[l-spine spot]
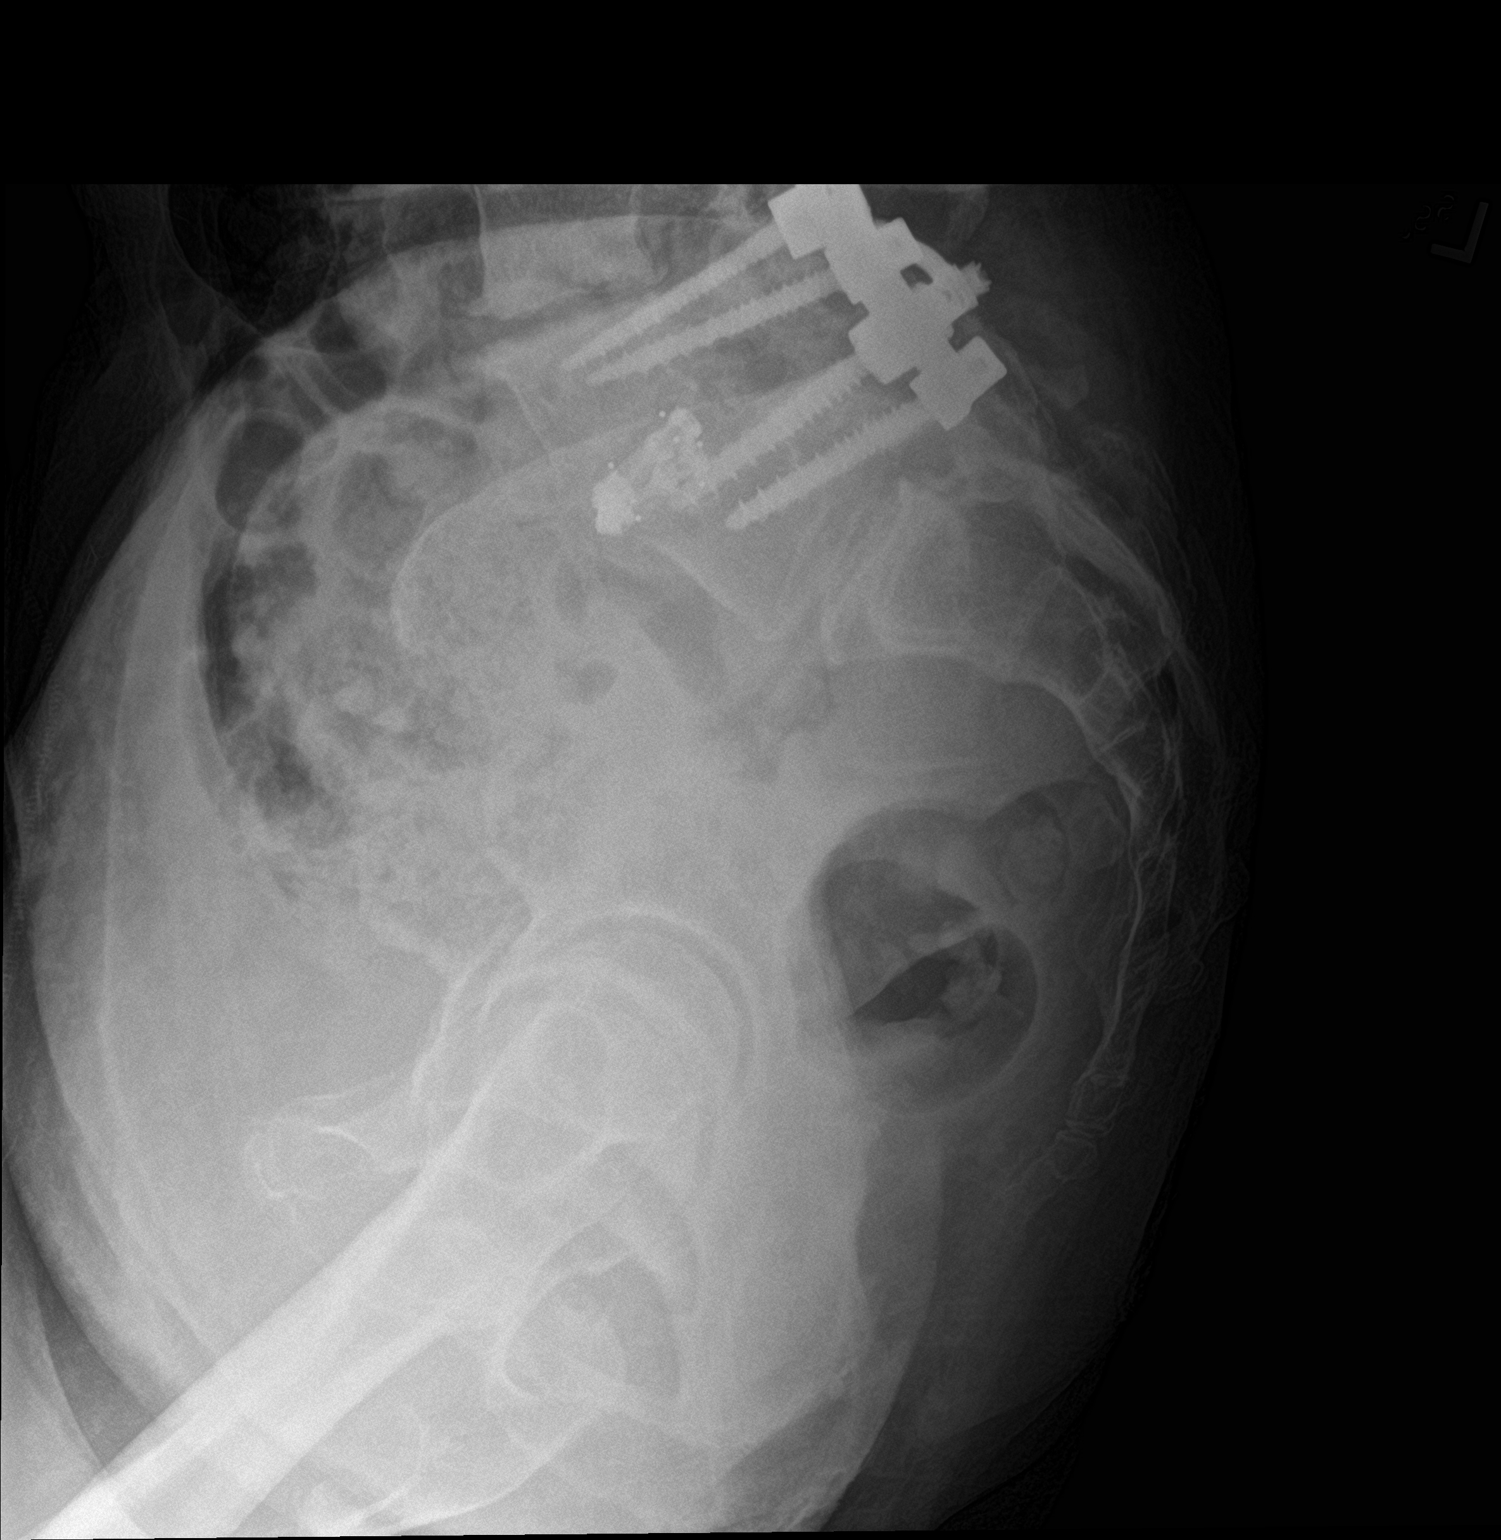

[3 of 3 positions shown; findings below may reference images not displayed]

FINDINGS: Posterior rod and intrapedicular screw fusion of L4-L5 with
interbody spacer. No hardware fracture. There is up to 2 mm mm
lucency adjacent to the L4 pedicle screws which may indicate
loosening but is nonacute. Levo scoliotic curvature of the lower
lumbar spine. Vertebral body heights are preserved. No evidence of
acute fracture. Disc space narrowing and endplate spurring at
multiple levels, most prominent at L2-L3 and L3-L4.
IMPRESSION: 1. No acute fracture of the lumbar spine.
2. Posterior rod and intrapedicular screw fusion of L4-L5 with
interbody spacer. Lucency adjacent to the L4 pedicle screws may
indicate loosening, but is nonacute. There are no prior exams
available for comparison to assess for acuity.
3. Scoliosis and multilevel degenerative disc disease.

## 2021-01-29 NOTE — Telephone Encounter (Signed)
JD please advise.  Pt did not keep her appt with you in 05/2020.  She was last seen 02/2020.  There is an order in the system for CT scan that was placed 02/2020.  Pt stated that she see's in mychart that she needs to scheduled the CT scan.  Do you want her to have the CT scan then see you or see you first?  thanks

## 2021-02-05 ENCOUNTER — Other Ambulatory Visit: Payer: Self-pay | Admitting: Internal Medicine

## 2021-02-07 ENCOUNTER — Other Ambulatory Visit: Payer: Self-pay | Admitting: Internal Medicine

## 2021-03-08 ENCOUNTER — Other Ambulatory Visit: Payer: Self-pay | Admitting: Internal Medicine

## 2021-03-30 IMAGING — MR MR CARD MORPHOLOGY WO/W CM
44 of 48 series · 44 of 48 positions shown · IV contrast (Contrast agent)
Comparison: none

CLINICAL DATA: 73-year-old female with chronic systolic CHF,
non-ischemic cardiomyopathy, LVEF 20-25% on echocardiogram in
[DATE], ? Pulmonary hypertension, LBBB, mitral and tricuspid
regurgitation.

EXAM:
CARDIAC MRI
TECHNIQUE: The patient was scanned on a 1.5 Tesla GE magnet. A dedicated
cardiac coil was used. Functional imaging was done using Fiesta
sequences. [DATE], and 4 chamber views were done to assess for RWMA's.
Modified Muddassir rule using a short axis stack was used to
calculate an ejection fraction on a dedicated work station using
Circle software. The patient received 8 cc of Gadavist. After 10
minutes inversion recovery sequences were used to assess for
infiltration and scar tissue.
CONTRAST:  8 cc  of Gadavist

[Series 4: [id number]_bh · axial · 8.0mm · 1.33mm/px · 1 of 16 slices shown]
[im 1/16]
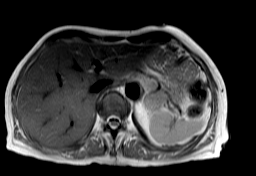

[Series 9: bSSFP · oblique · 8.0mm · 1.52mm/px · 1 of 25 slices shown (1 of 25)]
[im 1/25]
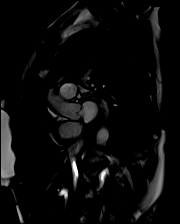

[Series 10: bSSFP · oblique · 8.0mm · 1.52mm/px · 1 of 25 slices shown (2 of 25)]
[im 1/25]
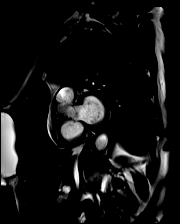

[Series 11: bSSFP · oblique · 8.0mm · 1.52mm/px · 1 of 25 slices shown (3 of 25)]
[im 1/25]
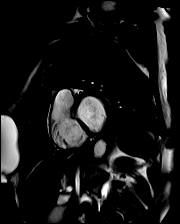

[Series 12: bSSFP · oblique · 8.0mm · 1.52mm/px · 1 of 25 slices shown (4 of 25)]
[im 1/25]
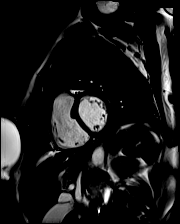

[Series 13: bSSFP · oblique · 8.0mm · 1.52mm/px · 1 of 25 slices shown (5 of 25)]
[im 1/25]
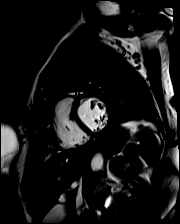

[Series 14: bSSFP · oblique · 8.0mm · 1.52mm/px · 1 of 25 slices shown (6 of 25)]
[im 1/25]
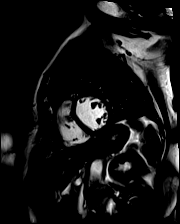

[Series 15: bSSFP · oblique · 8.0mm · 1.52mm/px · 1 of 25 slices shown (7 of 25)]
[im 1/25]
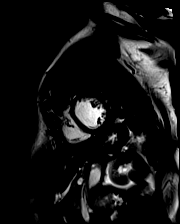

[Series 16: bSSFP · oblique · 8.0mm · 1.52mm/px · 1 of 25 slices shown (8 of 25)]
[im 1/25]
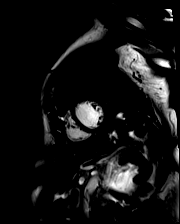

[Series 17: bSSFP · oblique · 8.0mm · 1.52mm/px · 1 of 25 slices shown (9 of 25)]
[im 1/25]
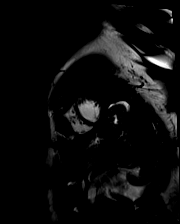

[Series 18: bSSFP · oblique · 8.0mm · 1.52mm/px · 1 of 25 slices shown (10 of 25)]
[im 1/25]
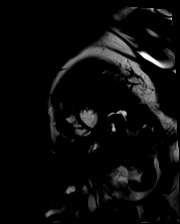

[Series 19: bSSFP · oblique · 8.0mm · 1.52mm/px · 1 of 25 slices shown (11 of 25)]
[im 1/25]
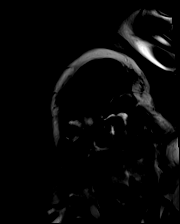

[Series 20: bSSFP · oblique · 8.0mm · 1.52mm/px · 1 of 25 slices shown (12 of 25)]
[im 1/25]
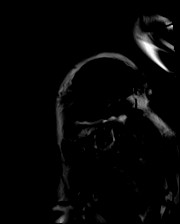

[Series 21: bSSFP · oblique · 8.0mm · 1.52mm/px · 1 of 25 slices shown (13 of 25)]
[im 1/25]
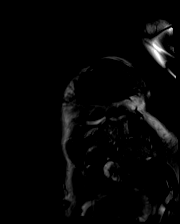

[Series 22: bSSFP · oblique · 8.0mm · 1.52mm/px · 1 of 25 slices shown (14 of 25)]
[im 1/25]
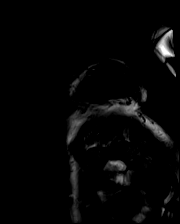

[Series 23: bSSFP · oblique · 8.0mm · 1.52mm/px · 1 of 25 slices shown (15 of 25)]
[im 1/25]
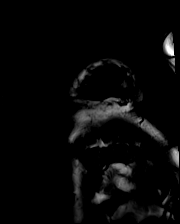

[Series 24: (id)_long_t1 · oblique · 8.0mm · 1.33mm/px · 1 of 24 slices shown]
[im 1/24]
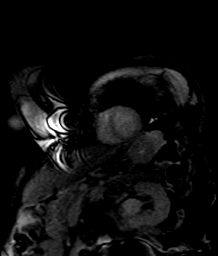

[Series 25: (id)_long_t1_moco · oblique · 8.0mm · 1.33mm/px · 1 of 24 slices shown]
[im 1/24]
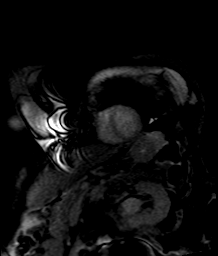

[Series 28: (id)_trufi · oblique · 8.0mm · 1.88mm/px · 1 of 9 slices shown]
[im 1/9]
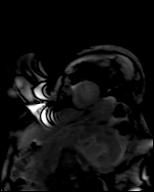

[Series 29: (id)_trufi_moco · oblique · 8.0mm · 1.88mm/px · 1 of 9 slices shown]
[im 1/9]
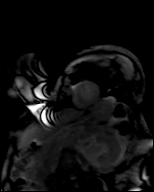

[Series 32: bSSFP · axial · 6.0mm · 1.41mm/px · 1 of 25 slices shown (16 of 25)]
[im 1/25]
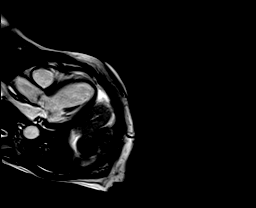

[Series 33: bSSFP · sagittal · 6.0mm · 1.41mm/px · 1 of 25 slices shown (17 of 25)]
[im 1/25]
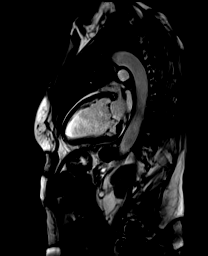

[Series 34: bSSFP · axial · 6.0mm · 1.41mm/px · 1 of 25 slices shown (18 of 25)]
[im 1/25]
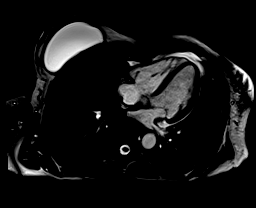

[Series 35: STIR · oblique · 8.0mm · 1.73mm/px · 1 of 15 slices shown]
[im 1/15]
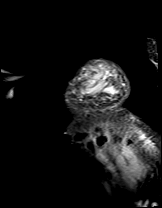

[Series 36: bSSFP · coronal · 6.0mm · 1.41mm/px · 1 of 25 slices shown (19 of 25)]
[im 1/25]
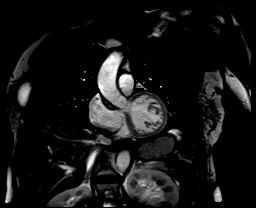

[Series 37: cine rvit · coronal · 6.0mm · 1.41mm/px · 1 of 25 slices shown]
[im 1/25]
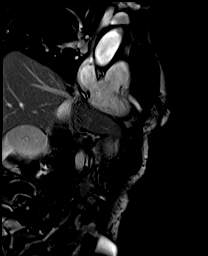

[Series 38: aortic valve cine · oblique · 6.0mm · 1.41mm/px · 1 of 25 slices shown (1 of 2)]
[im 1/25]
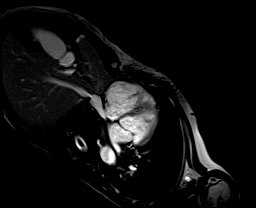

[Series 39: cine rvot · sagittal · 6.0mm · 1.41mm/px · 1 of 25 slices shown]
[im 1/25]
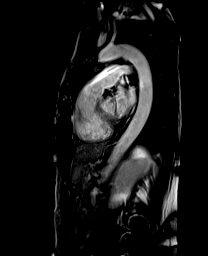

[Series 40: aortic valve cine · oblique · 6.0mm · 1.41mm/px · 1 of 25 slices shown (2 of 2)]
[im 1/25]
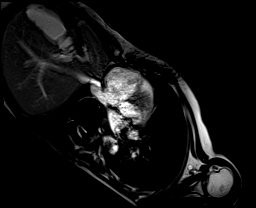

[Series 42: lge_single shot sa · oblique · 8.0mm · 1.98mm/px · 1 of 10 slices shown (1 of 2)]
[im 1/10]
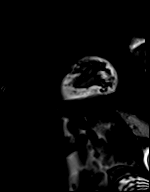

[Series 43: lge_single shot sa · oblique · 8.0mm · 1.98mm/px · 1 of 10 slices shown (2 of 2)]
[im 1/10]
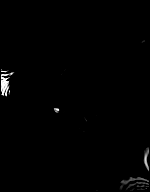

[Series 46: lge_single shot 4 · axial · 6.0mm · 1.98mm/px · 1 of 1 slices shown (1 of 2)]
[im 1/1]
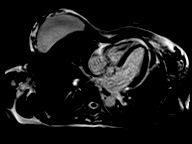

[Series 47: lge_single shot 4 · axial · 6.0mm · 1.98mm/px · 1 of 1 slices shown (2 of 2)]
[im 1/1]
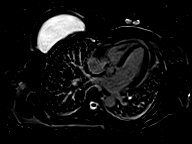

[Series 48: lge_single shot 3 · axial · 6.0mm · 1.98mm/px · 1 of 1 slices shown (1 of 2)]
[im 1/1]
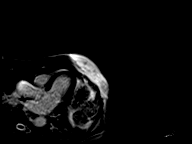

[Series 49: lge_single shot 3 · axial · 6.0mm · 1.98mm/px · 1 of 1 slices shown (2 of 2)]
[im 1/1]
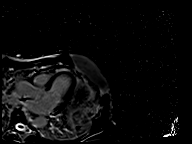

[Series 50: (id)_short_t1 · oblique · 8.0mm · 1.41mm/px · 1 of 27 slices shown]
[im 1/27]
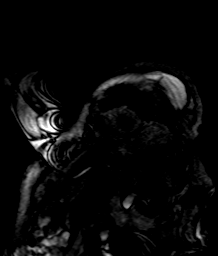

[Series 51: (id)_short_t1_moco · oblique · 8.0mm · 1.41mm/px · 1 of 27 slices shown]
[im 1/27]
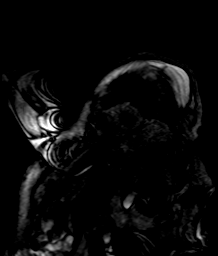

[Series 52: (id)_short_t1_moco_t1 · oblique · 8.0mm · 1.41mm/px · 1 of 6 slices shown]
[im 1/6]
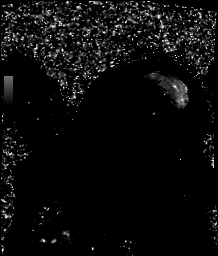

[Series 55: bSSFP · oblique · 6.0mm · 1.73mm/px · 1 of 15 slices shown (20 of 25)]
[im 1/15]
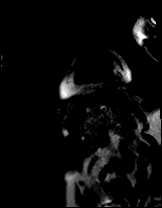

[Series 56: bSSFP · oblique · 6.0mm · 1.73mm/px · 1 of 15 slices shown (21 of 25)]
[im 1/15]
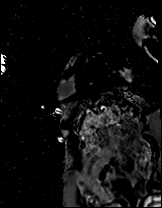

[Series 59: bSSFP · axial · 6.0mm · 1.73mm/px · 1 of 1 slices shown (22 of 25)]
[im 1/1]
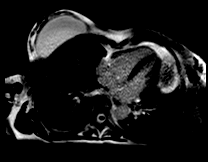

[Series 60: bSSFP · axial · 6.0mm · 1.73mm/px · 1 of 1 slices shown (23 of 25)]
[im 1/1]
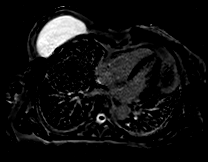

[Series 61: bSSFP · axial · 6.0mm · 1.73mm/px · 1 of 1 slices shown (24 of 25)]
[im 1/1]
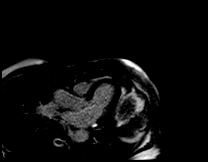

[Series 62: bSSFP · axial · 6.0mm · 1.73mm/px · 1 of 1 slices shown (25 of 25)]
[im 1/1]
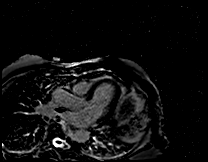

[44 of 48 positions shown; findings below may reference images not displayed]

FINDINGS: 1. Normal left ventricular size, thickness and systolic function
(LVEF = 58%). There is paradoxical septal motion consistent with
LBBB. There is no late gadolinium enhancement in the left
ventricular myocardium. ECV 28%.

LVEDD: 50 mm

LVESD: 36 mm

LVEDV: 109 ml

LVESV: 46 ml

SV: 64 ml

CO: 4.6 L/min

Myocardial mass: 83 g

2. Normal right ventricular size, thickness and systolic function
(RVEF = 61%). There are no regional wall motion abnormalities.

3.  Normal left and right atrial size.

4. Normal size of the aortic root, ascending aorta and pulmonary
artery.

5.  Mild mitral and tricuspid regurgitation.

6.  Normal pericardium.  Trivial pericardial effusion.
IMPRESSION: 1. Normal left ventricular size, thickness and systolic function
(LVEF = 58% with paradoxical septal motion consistent with LBBB.
There is no late gadolinium enhancement in the left ventricular
myocardium. ECV 28%.

2. Normal right ventricular size, thickness and systolic function
(RVEF = 61%). There are no regional wall motion abnormalities.

3.  Normal left and right atrial size.

4. Normal size of the aortic root, ascending aorta and pulmonary
artery.

5.  Mild mitral and tricuspid regurgitation.

6.  Normal pericardium.  Trivial pericardial effusion.

Since the echocardiogram on 06/20/2019 there has been improvement of
left and right ventricular function, both currently at the normal
range. There is no evidence for infiltrative of inflammatory
intramyocardial process.

## 2021-04-15 ENCOUNTER — Encounter (HOSPITAL_BASED_OUTPATIENT_CLINIC_OR_DEPARTMENT_OTHER): Payer: Self-pay | Admitting: Emergency Medicine

## 2021-04-15 ENCOUNTER — Emergency Department (HOSPITAL_BASED_OUTPATIENT_CLINIC_OR_DEPARTMENT_OTHER): Payer: Medicare PPO

## 2021-04-15 ENCOUNTER — Other Ambulatory Visit: Payer: Self-pay

## 2021-04-15 ENCOUNTER — Emergency Department (HOSPITAL_BASED_OUTPATIENT_CLINIC_OR_DEPARTMENT_OTHER)
Admission: EM | Admit: 2021-04-15 | Discharge: 2021-04-15 | Disposition: A | Discharge status: Home / Self Care | Payer: Medicare PPO | Attending: Emergency Medicine | Admitting: Emergency Medicine

## 2021-04-15 DIAGNOSIS — Z7901 Long term (current) use of anticoagulants: Secondary | ICD-10-CM | POA: Insufficient documentation

## 2021-04-15 DIAGNOSIS — M542 Cervicalgia: Secondary | ICD-10-CM | POA: Diagnosis present

## 2021-04-15 DIAGNOSIS — S199XXA Unspecified injury of neck, initial encounter: Secondary | ICD-10-CM | POA: Diagnosis present

## 2021-04-15 DIAGNOSIS — W010XXA Fall on same level from slipping, tripping and stumbling without subsequent striking against object, initial encounter: Secondary | ICD-10-CM | POA: Insufficient documentation

## 2021-04-15 DIAGNOSIS — S9031XA Contusion of right foot, initial encounter: Secondary | ICD-10-CM | POA: Diagnosis not present

## 2021-04-15 DIAGNOSIS — W19XXXA Unspecified fall, initial encounter: Secondary | ICD-10-CM

## 2021-04-15 DIAGNOSIS — S161XXA Strain of muscle, fascia and tendon at neck level, initial encounter: Secondary | ICD-10-CM | POA: Insufficient documentation

## 2021-04-15 DIAGNOSIS — S9032XA Contusion of left foot, initial encounter: Secondary | ICD-10-CM | POA: Insufficient documentation

## 2021-04-15 DIAGNOSIS — S60222A Contusion of left hand, initial encounter: Secondary | ICD-10-CM | POA: Insufficient documentation

## 2021-04-15 NOTE — Discharge Instructions (Signed)
You were seen in the emergency department for evaluation of injuries from a fall.  You had a CAT scan of your head and cervical spine along with x-rays of your left hand and both feet that did not show any fractures or dislocation.  They did show a lot of arthritis changes.  You can use Tylenol as needed for pain.  Ice.  Follow-up with your doctor.  Return to the emergency department if any worsening or concerning symptoms.

## 2021-04-15 NOTE — ED Triage Notes (Signed)
Pt arrives pov from [place], reports mechanical fall yesterday. Pt endorses right hand pain, and toe pain bilaterally. Also c/o coccyx pain. Endorses thinners, denies hitting head or loc

## 2021-04-15 NOTE — ED Provider Notes (Signed)
[facility] EMERGENCY DEPARTMENT Provider Note   CSN: [id number] Arrival date & time: 04/15/21  7371     History  Chief Complaint  Patient presents with   Samantha Keller is a 76 y.o. female.  She has a history of rheumatoid arthritis paroxysmal A. fib on Eliquis.  She had a trip and fall yesterday.  She denies hitting her head, no loss of consciousness.  Complaining of pain in the toes of both of her feet, left hand index finger, and neck.  No numbness or weakness.  Pain is worse with movement.  No chest or abdominal pain.  No back pain.  The history is provided by the patient.  Fall This is a new problem. The current episode started yesterday. The problem occurs rarely. The problem has not changed since onset.Pertinent negatives include no chest pain, no abdominal pain, no headaches and no shortness of breath. The symptoms are aggravated by bending, twisting and walking. Nothing relieves the symptoms. She has tried rest for the symptoms. The treatment provided no relief.      Home Medications Prior to Admission medications   Medication Sig Start Date End Date Taking? Authorizing Provider  apixaban (ELIQUIS) 5 MG TABS tablet TAKE 1 TABLET(5 MG) BY MOUTH TWICE DAILY 11/24/20   Fay Records, MD  Ascorbic Acid (VITAMIN C) 500 MG CAPS Take 500 mg by mouth daily.    [provider]  cycloSPORINE (RESTASIS) 0.05 % ophthalmic emulsion Place 1 drop into both eyes 2 (two) times daily.     [provider]  DULoxetine (CYMBALTA) 60 MG capsule Take 60 mg by mouth daily.    [provider]  ENTRESTO 24-26 MG TAKE 1 TABLET BY MOUTH TWICE DAILY 03/08/21   Fay Records, MD  estradiol (CLIMARA - DOSED IN MG/24 HR) 0.0375 mg/24hr patch Place 0.0375 mg onto the skin as directed. Apply one patch to skin two times a week on sundays and wednesdays    [provider]  ferrous sulfate 325 (65 FE) MG tablet Take 325 mg by mouth daily with breakfast.     [provider]  folic acid (FOLVITE) 1 MG tablet Take 2 mg by mouth daily.    [provider]  gabapentin (NEURONTIN) 600 MG tablet Take 600 mg by mouth 3 (three) times daily.    [provider]  melatonin 5 MG TABS Take 5 mg by mouth at bedtime.    [provider]  methotrexate (RHEUMATREX) 2.5 MG tablet Take 10 mg by mouth once a week. 04/21/20   [provider]  metoprolol tartrate (LOPRESSOR) 25 MG tablet Take 1 tablet (25 mg total) by mouth as needed (Palpitations). 08/06/20 08/06/21  Oswald Hillock, MD  pantoprazole (PROTONIX) 40 MG tablet Take 40 mg by mouth daily.     [provider]  traZODone (DESYREL) 50 MG tablet Take 50 mg by mouth at bedtime. 12/10/19   [provider]      Allergies    Iodinated contrast media, Shellfish allergy, and Shellfish-derived products    Review of Systems   Review of Systems  Constitutional:  Negative for fever.  HENT:  Negative for sore throat.   Eyes:  Negative for visual disturbance.  Respiratory:  Negative for shortness of breath.   Cardiovascular:  Negative for chest pain.  Gastrointestinal:  Negative for abdominal pain.  Genitourinary:  Negative for dysuria.  Musculoskeletal:  Positive for neck pain.  Skin:  Negative  for rash.  Neurological:  Negative for headaches.   Physical Exam Updated Vital Signs BP 130/60    Pulse 78    Temp 98.1 F (36.7 C) (Oral)    Resp 16    Ht 5\' 2"  (1.575 m)    Wt 54.4 kg    SpO2 96%    BMI 21.95 kg/m  Physical Exam Vitals and nursing note reviewed.  Constitutional:      General: She is not in acute distress.    Appearance: Normal appearance. She is well-developed.  HENT:     Head: Normocephalic and atraumatic.  Eyes:     Conjunctiva/sclera: Conjunctivae normal.  Cardiovascular:     Rate and Rhythm: Normal rate and regular rhythm.     Heart sounds: No murmur heard. Pulmonary:     Effort: Pulmonary effort is normal. No respiratory distress.      Breath sounds: Normal breath sounds.  Abdominal:     Palpations: Abdomen is soft.     Tenderness: There is no abdominal tenderness. There is no guarding or rebound.  Musculoskeletal:        General: Tenderness present. No swelling.     Cervical back: Neck supple.     Comments: She has a bruising over her left thigh.  Abrasion left shin.  Tenderness over the toes of her right and left feet.  She has bunion and hammertoes.  She is also tender over her left index finger.  Full range of motion of upper and lower extremities without any limitations.  She has diffuse tenderness of her cervical spine midline and paracervical.  No back tenderness.  Skin:    General: Skin is warm and dry.     Capillary Refill: Capillary refill takes less than 2 seconds.  Neurological:     General: No focal deficit present.     Mental Status: She is alert and oriented to person, place, and time.     Cranial Nerves: No cranial nerve deficit.     Sensory: No sensory deficit.     Motor: No weakness.     Gait: Gait normal.  Psychiatric:        Mood and Affect: Mood normal.    ED Results / Procedures / Treatments   Labs (all labs ordered are listed, but only abnormal results are displayed) Labs Reviewed - No data to display  EKG None  Radiology CT Head Wo Contrast  Result Date: 04/15/2021 CLINICAL DATA:  Fall, trauma EXAM: CT HEAD WITHOUT CONTRAST TECHNIQUE: Contiguous axial images were obtained from the base of the skull through the vertex without intravenous contrast. RADIATION DOSE REDUCTION: This exam was performed according to the departmental dose-optimization program which includes automated exposure control, adjustment of the mA and/or kV according to patient size and/or use of iterative reconstruction technique. COMPARISON:  CT head 07/13/2019 FINDINGS: Brain: There is no evidence of acute intracranial hemorrhage, extra-axial fluid collection, or acute infarct. Background parenchymal volume is normal.  The ventricles are normal in size. 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 There is a small remote lacunar infarct in the left internal capsule. Patchy additional hypodensity in the subcortical and periventricular white matter likely reflects sequela of mild chronic white matter microangiopathy. There is no mass lesion.  There is no midline shift. Vascular: There is calcification of the bilateral cavernous ICAs. Skull: Normal. Negative for fracture or focal lesion. Sinuses/Orbits: Imaged paranasal sinuses are clear. Bilateral lens implants are in place. The globes and orbits  are otherwise unremarkable. Other: None. IMPRESSION: 1. No acute intracranial hemorrhage or calvarial fracture. 2. 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 Electronically Signed   By: Valetta Mole M.D.   On: 04/15/2021 11:59   CT Cervical Spine Wo Contrast  Result Date: 04/15/2021 CLINICAL DATA:  Fall EXAM: CT CERVICAL SPINE WITHOUT CONTRAST TECHNIQUE: Multidetector CT imaging of the cervical spine was performed without intravenous contrast. Multiplanar CT image reconstructions were also generated. RADIATION DOSE REDUCTION: This exam was performed according to the departmental dose-optimization program which includes automated exposure control, adjustment of the mA and/or kV according to patient size and/or use of iterative reconstruction technique. COMPARISON:  Cervical spine CT 07/13/2019 FINDINGS: Alignment: There is grade 1 anterolisthesis of C2 on C3 and C7 on T1, unchanged. There is no jumped or perched facets or other evidence of traumatic malalignment. Skull base and vertebrae: Skull base alignment is maintained. The patient is status post C4 through C7 ACDF. There is no evidence of hardware related complication. There is no evidence of acute fracture. There is no suspicious osseous lesion. Soft tissues and spinal canal: No prevertebral fluid or swelling.  No visible canal hematoma. Disc levels: There is multilevel uncovertebral ridging and facet arthropathy. The osseous spinal canal appears grossly patent. There is multilevel neural foraminal stenosis, severe on the left at C2-C3 and C3-C4, and bilaterally at C4-C5. Upper chest: The imaged lung apices are clear. Other: None. IMPRESSION: 1. No acute fracture or traumatic malalignment of the cervical spine. 2. Status post C4 through C7 ACDF without evidence of complication. 3. Multilevel degenerative changes as above. Electronically Signed   By: Valetta Mole M.D.   On: 04/15/2021 12:04   DG Hand Complete Left  Result Date: 04/15/2021 CLINICAL DATA:  Trauma and pain. EXAM: LEFT HAND - COMPLETE 3+ VIEW COMPARISON:  None. FINDINGS: Overlap of fingers on the lateral view. No acute fracture or dislocation. Moderate degenerative changes, including at the base of the thumb and the 2/3 metacarpal phalangeal joints. IMPRESSION: Degenerative change, without acute osseous finding. Electronically Signed   By: Abigail Miyamoto M.D.   On: 04/15/2021 12:08   DG Foot Complete Left  Result Date: 04/15/2021 CLINICAL DATA:  Fall, pain EXAM: RIGHT FOOT COMPLETE - 3+ VIEW; LEFT FOOT - COMPLETE 3+ VIEW COMPARISON:  None. FINDINGS: There is no evidence of fracture or dislocation. Bilateral hammertoe deformities of the second and fifth digits. Bilateral bunion deformities with mild associated bilateral first metatarsophalangeal arthrosis. Mild midfoot arthrosis. Soft tissues are unremarkable. There is no evidence of arthropathy or other focal bone abnormality. Soft tissues are unremarkable. IMPRESSION: No fracture or dislocation of the bilateral feet. Electronically Signed   By: Delanna Ahmadi M.D.   On: 04/15/2021 12:13   DG Foot Complete Right  Result Date: 04/15/2021 CLINICAL DATA:  Fall, pain EXAM: RIGHT FOOT COMPLETE - 3+ VIEW; LEFT FOOT - COMPLETE 3+ VIEW COMPARISON:  None. FINDINGS: There is no evidence of fracture or  dislocation. Bilateral hammertoe deformities of the second and fifth digits. Bilateral bunion deformities with mild associated bilateral first metatarsophalangeal arthrosis. Mild midfoot arthrosis. Soft tissues are unremarkable. There is no evidence of arthropathy or other focal bone abnormality. Soft tissues are unremarkable. IMPRESSION: No fracture or dislocation of the bilateral feet. Electronically Signed   By: Delanna Ahmadi M.D.   On: 04/15/2021 12:13    Procedures Procedures    Medications Ordered in ED Medications - No data to display  ED Course/ Medical  Decision Making/ A&P Clinical Course as of 04/15/21 1909  Ludwig Clarks Apr 15, 2021  1208 CT head and cervical spine interpreted by me as no acute findings.  X-rays of bilateral feet and left hand also do not show any acute fracture or dislocation.  Awaiting radiology reading. [MB]    Clinical Course User Index [MB] Hayden Rasmussen, MD                           Medical Decision Making Amount and/or Complexity of Data Reviewed Radiology: ordered.  This patient complains of left hand pain, bilateral foot pain, neck pain after a fall on blood thinners; this involves an extensive number of treatment Options and is a complaint that carries with it a high risk of complications and Morbidity. The differential includes intracranial bleed, fracture, dislocation, contusion I ordered imaging studies which included head CT and cervical spine CT, left hand x-ray bilateral foot x-rays and I independently    visualized and interpreted imaging which showed degenerative changes no acute findings Previous records obtained and reviewed in epic no recent admissions  After the interventions stated above, I reevaluated the patient and found patient be otherwise well-appearing.  No indications for admission at this time.  Recommended symptomatic treatment and close follow-up with PCP.  Return instructions discussed          Final Clinical  Impression(s) / ED Diagnoses Final diagnoses:  Contusion of left foot, initial encounter  Contusion of right foot, initial encounter  Contusion of left hand, initial encounter  Strain of neck muscle, initial encounter  Fall, initial encounter    Rx / DC Orders ED Discharge Orders     None         Hayden Rasmussen, MD 04/15/21 1911

## 2021-05-12 NOTE — Progress Notes (Signed)
Elderly patient on anticoagulation here for evaluation of neck pain and foot pain after fall.  Although no signs of trauma to the head felt that imaging of head and neck were reasonable due to mechanism.

## 2021-06-17 ENCOUNTER — Other Ambulatory Visit: Payer: Self-pay | Admitting: Internal Medicine

## 2021-06-17 NOTE — Telephone Encounter (Signed)
Eliquis 5 mg refill request received. Patient is 76 years old, weight- 54.4 kg, Crea- 1.2 on 01/20/21, Diagnosis- PAF, and last seen by Dr. Harrington Challenger on 08/15/20. Dose is appropriate based on dosing criteria. Will send in refill to requested pharmacy.   ?

## 2021-07-18 NOTE — Progress Notes (Signed)
? ?Cardiology Office Note ? ? ?Date:  07/20/2021  ? ?ID:  Samantha Keller, DOB 06-22-1945, MRN [id number] ? ?PCP:  [name], PA-C  ?Cardiologist:   [name], MD  ? ? ?Pt presents for reeval of NICM, HTN  ?  ?History of Present Illness: ?Samantha Keller is a 76 y.o. female with a history ofNICM, LBBB, HTN, GI bleeding, PAF  and RA  ?She was admitted to [facility] in march 2021 with CHF  Echo showed LVEF was 20 to 25%  Cardiac cath showed normal coronary arteries    ? The pt was initially  seen by [name].  Cardiac MRI in July 2021 showed LVEF 58% with no LGE   RVEF normal ? ?I saw the pt in clinic in Feb 2022 ?on May 12 she was in [place] at a gas station    She developed chest heaviness, SOB    Tried to get out of the car and got dizzy   Developed HA    WWent to bathroom but still SOB    Went to ED  Found to be in Afib with RVR    She was initially at [facility]   Hypotensive    Given IV fluids   Placed on Cardiazem gtt and transferred to [facility]    ?The pt converted back to SR   Started on Eliquis  (CHA2DS2VASc 3)   Troponin was elevated in 100s   Rel flat   Felt due to demand ischemia in setting of high HR and low BP    Echo showed LVEF 50 to 60%  Keep on Entresto   ? ?Since d/c she has had no further palpitatoins   Breathing is OK   No dizzeinss ? Walking     ?She wonders what to do if she has more afib   ? ?I saw the pt in clinic in May 2022 ? ?Did have afib one night   Woke her up   Did not take anything    Spell lasted about 10 to 15 min   Showed on fitbit   No other spells  ? ?Current Meds  ?Medication Sig  ? Ascorbic Acid (VITAMIN C) 500 MG CAPS Take 500 mg by mouth daily.  ? Cholecalciferol (VITAMIN D3) 25 MCG (1000 UT) CAPS 1 capsule  ? cycloSPORINE (RESTASIS) 0.05 % ophthalmic emulsion Place 1 drop into both eyes 2 (two) times daily.   ? DULoxetine (CYMBALTA) 60 MG capsule Take 60 mg by mouth daily.  ? ELIQUIS 5 MG TABS tablet TAKE 1 TABLET(5 MG) BY MOUTH TWICE DAILY  ? ENTRESTO  24-26 MG TAKE 1 TABLET BY MOUTH TWICE DAILY  ? ferrous sulfate 325 (65 FE) MG tablet Take 325 mg by mouth daily with breakfast.  ? folic acid (FOLVITE) 1 MG tablet Take 2 mg by mouth daily.  ? gabapentin (NEURONTIN) 600 MG tablet Take 600 mg by mouth 3 (three) times daily.  ? melatonin 5 MG TABS Take 5 mg by mouth at bedtime.  ? methotrexate (RHEUMATREX) 2.5 MG tablet Take 10 mg by mouth once a week. Per patient taking 6 tablets once a week totally 15 mg  ? metoprolol tartrate (LOPRESSOR) 25 MG tablet Take 1 tablet (25 mg total) by mouth as needed (Palpitations).  ? Omega-3 Fatty Acids (FISH OIL) 500 MG CAPS Take by mouth as needed.  ? pantoprazole (PROTONIX) 40 MG tablet Take 40 mg by mouth daily.   ? traZODone (DESYREL) 50 MG  tablet Take 50 mg by mouth at bedtime.  ? ? ? ?Allergies:   Iodinated contrast media, Shellfish allergy, and Shellfish-derived products  ? ?Past Medical History:  ?Diagnosis Date  ? Abscess of bursa of right hip   ? Acute kidney failure ([place])   ? Alcohol withdrawal ([place])   ? Asthma   ? Cellulitis of unspecified part of limb   ? Chronic tension type headache   ? Depression   ? Dysphagia, oropharyngeal phase   ? Essential (primary) hypertension   ? Hyperosmolality and/or hypernatremia   ? Hypovolemic shock ([place])   ? Iron deficiency anemia   ? LBBB (left bundle branch block)   ? Malignant neoplasm of upper lobe bronchus, left (HCC)   ? Metabolic encephalopathy   ? MSSA bacteremia 2021  ? Muscle weakness (generalized)   ? Peritoneal abscess ([place])   ? Pneumonia   ? RA (rheumatoid arthritis) ([place])   ? Respiratory failure ([place])   ? Staph infection   ? UGIB (upper gastrointestinal bleed) 2021  ? Unspecified protein-calorie malnutrition ([place])   ? ? ?Past Surgical History:  ?Procedure Laterality Date  ? ABCESS DRAINAGE Bilateral 2021  ? ABDOMINAL HYSTERECTOMY    ? BREAST ENHANCEMENT SURGERY    ? CERVICAL FUSION    ? COLONOSCOPY    ? about 5 years ago. Been doing cologuard  ? ESOPHAGOGASTRODUODENOSCOPY     ? [facility]   ? EYE SURGERY Bilateral   ? lens replacement  ? INCISION AND DRAINAGE OF WOUND Bilateral 2021  ? hip, thigh  ? IR THORACENTESIS ASP PLEURAL SPACE W/IMG GUIDE  06/20/2019  ? LUMBAR FUSION    ? RIGHT/LEFT HEART CATH AND CORONARY ANGIOGRAPHY N/A 06/27/2019  ? Procedure: RIGHT/LEFT HEART CATH AND CORONARY ANGIOGRAPHY;  Surgeon: Martinique, Peter M, MD;  Location: [facility];  Service: Cardiovascular;  Laterality: N/A;  ? TONSILLECTOMY    ? ? ? ?Social History:  The patient  reports that she quit smoking about 39 years ago. Her smoking use included cigarettes. She has a 40.00 pack-year smoking history. She has never used smokeless tobacco. She reports current alcohol use of about 1.0 standard drink per week. She reports that she does not use drugs.  ? ?Family History:  The patient's family history includes Asthma in her father, mother, and sister; Breast cancer in her paternal aunt; CAD in her father; COPD in her father; High Cholesterol in her brother.  ? ? ?ROS:  Please see the history of present illness. All other systems are reviewed and  Negative to the above problem except as noted.  ? ? ?PHYSICAL EXAM: ?VS:  BP 122/60   Pulse 60   Ht 5\' 2"  (1.575 m)   Wt 122 lb 12.8 oz (55.7 kg)   SpO2 96%   BMI 22.46 kg/m?   ?GEN: Well nourished, well developed, in no acute distress  ?HEENT: normal  ?Neck: no JVD, carotid bruits ?Cardiac: RRR; no murmurs .  No  LE edema  ?Respiratory:  clear to auscultation bilaterally,  ?GI: soft, nontender, nondistended, + BS  No hepatomegaly  ?MS: no deformity Moving all extremities   ?Skin: warm and dry, no rash ?Neuro:  Strength and sensation are intact ?Psych: euthymic mood, full affect ? ? ?EKG:  EKG isordered today.  SR 60 bpm    ? ? ?Lipid Panel ?No results found for: CHOL, TRIG, HDL, CHOLHDL, VLDL, LDLCALC, LDLDIRECT ?  ? ?Wt Readings from Last 3 Encounters:  ?  07/20/21 122 lb 12.8 oz (55.7 kg)  ?04/15/21 120 lb (54.4 kg)  ?08/13/20 122 lb  (55.3 kg)  ?  ? ? ?ASSESSMENT AND PLAN: ? ?1  PAF   REcent spell  of afib  On anticoagulation Brief   If continues or increases would consider other Rx   For now though would just follow   Recomm she watch for dizziness   Want to avoid falls ? ?2   Hx NICM  No CAD  Pt had MRI in July 2021 that showed normalization of LVEF    Keep on Entresto ? ?2  Hx HTN    BP is controlled   Keep on current meds    ? ?3 CP  Pt currently denies    ? ?4  Lpids   WIll need to be followed ? ?Will get labs from Dr Amil Amen    ? ?Current medicines are reviewed at length with the patient today.  The patient does not have concerns regarding medicines. ? ?Signed, ?[name], MD  ?07/20/2021 2:57 PM    ?[facility] ?[address], [place], [place]  [address] ?Phone: [phone]; Fax: [phone]  ? ? ?

## 2021-07-20 ENCOUNTER — Encounter: Payer: Self-pay | Admitting: Internal Medicine

## 2021-07-20 ENCOUNTER — Ambulatory Visit: Payer: Medicare PPO | Admitting: Internal Medicine

## 2021-07-20 VITALS — BP 122/60 | HR 60 | Ht 62.0 in | Wt 122.8 lb

## 2021-07-20 DIAGNOSIS — I480 Paroxysmal atrial fibrillation: Secondary | ICD-10-CM

## 2021-07-20 NOTE — Patient Instructions (Signed)
Medication Instructions:  ? ?*If you need a refill on your cardiac medications before your next appointment, please call your pharmacy* ? ? ?Lab Work: ? ?If you have labs (blood work) drawn today and your tests are completely normal, you will receive your results only by: ?MyChart Message (if you have MyChart) OR ?A paper copy in the mail ?If you have any lab test that is abnormal or we need to change your treatment, we will call you to review the results. ? ? ?Testing/Procedures: ? ? ? ?Follow-Up: ?At [facility], you and your health needs are our priority.  As part of our continuing mission to provide you with exceptional heart care, we have created designated Provider Care Teams.  These Care Teams include your primary Cardiologist (physician) and Advanced Practice Providers (APPs -  Physician Assistants and Nurse Practitioners) who all work together to provide you with the care you need, when you need it. ? ?We recommend signing up for the patient portal called "MyChart".  Sign up information is provided on this After Visit Summary.  MyChart is used to connect with patients for Virtual Visits (Telemedicine).  Patients are able to view lab/test results, encounter notes, upcoming appointments, etc.  Non-urgent messages can be sent to your provider as well.   ?To learn more about what you can do with MyChart, go to [email or web address].   ? ?Your next appointment:   ?1 year(s) ? ?The format for your next appointment:   ?In Person ? ?Provider:   ?[name], MD   ? ? ?Other Instructions ? ? ?Important Information About Sugar ? ? ? ? ?  ?

## 2021-09-14 ENCOUNTER — Other Ambulatory Visit: Payer: Self-pay | Admitting: Internal Medicine

## 2021-12-01 LAB — COLOGUARD: COLOGUARD: NEGATIVE

## 2022-01-25 ENCOUNTER — Emergency Department (HOSPITAL_COMMUNITY)
Admission: EM | Admit: 2022-01-25 | Discharge: 2022-01-26 | Disposition: A | Discharge status: Home / Self Care | Payer: Medicare PPO | Attending: Emergency Medicine | Admitting: Emergency Medicine

## 2022-01-25 DIAGNOSIS — R42 Dizziness and giddiness: Secondary | ICD-10-CM | POA: Diagnosis not present

## 2022-01-25 DIAGNOSIS — R002 Palpitations: Secondary | ICD-10-CM | POA: Insufficient documentation

## 2022-01-25 DIAGNOSIS — I4891 Unspecified atrial fibrillation: Secondary | ICD-10-CM | POA: Diagnosis not present

## 2022-01-25 DIAGNOSIS — Z7901 Long term (current) use of anticoagulants: Secondary | ICD-10-CM | POA: Diagnosis not present

## 2022-01-25 LAB — BASIC METABOLIC PANEL
Anion gap: 9 (ref 5–15)
BUN: 24 mg/dL — ABNORMAL HIGH (ref 8–23)
CO2: 23 mmol/L (ref 22–32)
Calcium: 8.9 mg/dL (ref 8.9–10.3)
Chloride: 108 mmol/L (ref 98–111)
Creatinine, Ser: 1.24 mg/dL — ABNORMAL HIGH (ref 0.44–1.00)
GFR, Estimated: 45 mL/min — ABNORMAL LOW (ref >60)
Glucose, Bld: 120 mg/dL — ABNORMAL HIGH (ref 70–99)
Potassium: 4 mmol/L (ref 3.5–5.1)
Sodium: 140 mmol/L (ref 135–145)

## 2022-01-25 LAB — CBC
HCT: 33 % — ABNORMAL LOW (ref 36.0–46.0)
Hemoglobin: 11 g/dL — ABNORMAL LOW (ref 12.0–15.0)
MCH: 35.9 pg — ABNORMAL HIGH (ref 26.0–34.0)
MCHC: 33.3 g/dL (ref 30.0–36.0)
MCV: 107.8 fL — ABNORMAL HIGH (ref 80.0–100.0)
Platelets: 222 10*3/uL (ref 150–400)
RBC: 3.06 MIL/uL — ABNORMAL LOW (ref 3.87–5.11)
RDW: 16.4 % — ABNORMAL HIGH (ref 11.5–15.5)
WBC: 7.9 10*3/uL (ref 4.0–10.5)
nRBC: 0 % (ref 0.0–0.2)

## 2022-01-25 MED ORDER — PROPOFOL 10 MG/ML IV BOLUS
INTRAVENOUS | Status: AC | PRN
  Administered 2022-01-25: 30 ug via INTRAVENOUS

## 2022-01-25 MED ORDER — PROPOFOL 10 MG/ML IV BOLUS: 0.5000 mg/kg | Freq: Once | INTRAVENOUS | Status: DC

## 2022-01-25 MED ORDER — PROPOFOL 10 MG/ML IV BOLUS
INTRAVENOUS | Status: AC
  Filled 2022-01-25: qty 20

## 2022-01-25 MED ORDER — LACTATED RINGERS IV SOLN
INTRAVENOUS | Status: DC
Start: 2022-01-25 — End: 2022-01-26

## 2022-01-25 MED ORDER — LACTATED RINGERS IV BOLUS: 1000.0000 mL | Freq: Once | INTRAVENOUS | Status: DC

## 2022-01-25 NOTE — ED Provider Notes (Signed)
.  Sedation  Date/Time: 01/25/2022 11:27 PM  Performed by: Ezequiel Essex, MD Authorized by: Ezequiel Essex, MD   Consent:    Consent obtained:  Written   Consent given by:  Patient   Risks discussed:  Allergic reaction, dysrhythmia, inadequate sedation, nausea, vomiting, respiratory compromise necessitating ventilatory assistance and intubation, prolonged sedation necessitating reversal and prolonged hypoxia resulting in organ damage Universal protocol:    Procedure explained and questions answered to patient or proxy's satisfaction: yes     Relevant documents present and verified: yes     Test results available: yes     Imaging studies available: yes     Required blood products, implants, devices, and special equipment available: yes     Site/side marked: yes     Immediately prior to procedure, a time out was called: no     Patient identity confirmed:  Provided demographic data Indications:    Procedure performed:  Cardioversion   Procedure necessitating sedation performed by:  Physician performing sedation Pre-sedation assessment:    Time since last food or drink:  6   ASA classification: class 2 - patient with mild systemic disease     Mouth opening:  3 or more finger widths   Thyromental distance:  4 finger widths   Mallampati score:  I - soft palate, uvula, fauces, pillars visible   Neck mobility: normal     Pre-sedation assessments completed and reviewed: airway patency, cardiovascular function, hydration status, mental status, nausea/vomiting, pain level, respiratory function and temperature     Pre-sedation assessment completed:  01/25/2022 11:15 PM Immediate pre-procedure details:    Reassessment: Patient reassessed immediately prior to procedure     Reviewed: vital signs, relevant labs/tests and NPO status     Verified: bag valve mask available, emergency equipment available, intubation equipment available, IV patency confirmed, oxygen available and reversal medications  available   Procedure details (see MAR for exact dosages):    Preoxygenation:  Nasal cannula   Sedation:  Propofol   Intended level of sedation: moderate (conscious sedation)   Intra-procedure monitoring:  Blood pressure monitoring, cardiac monitor, continuous pulse oximetry, continuous capnometry, frequent LOC assessments and frequent vital sign checks   Intra-procedure events: none     Total Provider sedation time (minutes):  15 Post-procedure details:    Post-sedation assessment completed:  01/25/2022 11:28 PM   Attendance: Constant attendance by certified staff until patient recovered     Recovery: Patient returned to pre-procedure baseline     Post-sedation assessments completed and reviewed: airway patency, cardiovascular function, hydration status, mental status, nausea/vomiting, pain level, respiratory function and temperature     Patient is stable for discharge or admission: yes     Procedure completion:  Tolerated well, no immediate complications     Ezequiel Essex, MD 01/25/22 2328

## 2022-01-25 NOTE — ED Provider Notes (Signed)
[facility] Emergency Department Provider Note MRN:  [id number]  Arrival date & time: 01/26/22     Chief Complaint   Afib RVR and Palpitations   History of Present Illness   Samantha Keller is a 76 y.o. year-old female presents to the ED with chief complaint of A-fib with RVR.  She states that she began having palpitations, lightheadedness, and dizziness that started around 8:30 PM tonight.  She states that she had been in her normal state of health prior to this.  She does have a known history of A-fib and takes Eliquis.  She has been compliant in taking the Eliquis and has not missed any doses.  She states that she also took a dose of metoprolol, which she only takes if she has A-fib symptoms.  EMS was called, and found the patient to be in A-fib with RVR with rates up to the 150s.  They administered 500 cc normal saline bolus with improvement of rates to the 120s.  Patient still feels palpitations.  She denies chest pain at rest, but says that if she were to stand up she'd probably have a little discomfort.  History provided by patient.   Review of Systems  Pertinent positive and negative review of systems noted in HPI.    Physical Exam   Vitals:   01/26/22 0200 01/26/22 0215  BP: 91/61 97/60  Pulse: 63 (!) 59  Resp: 13 14  Temp:    SpO2: 100% 100%    CONSTITUTIONAL:  non toxic-appearing, NAD NEURO:  Alert and oriented x 3, CN 3-12 grossly intact EYES:  eyes equal and reactive ENT/NECK:  Supple, no stridor  CARDIO:  tachycardic, irregularly irregular rhythm PULM:  No respiratory distress, CTAB GI/GU:  non-distended,  MSK/SPINE:  No gross deformities, no edema, moves all extremities  SKIN:  no rash, atraumatic   *Additional and/or pertinent findings included in MDM below  Diagnostic and Interventional Summary    EKG Interpretation  Date/Time:  Wednesday January 25 2022 22:50:18 EDT Ventricular Rate:  117 PR Interval:    QRS  Duration: 122 QT Interval:  348 QTC Calculation: 485 R Axis:   -13 Text Interpretation: Atrial fibrillation with rapid ventricular response Left bundle branch block Abnormal ECG When compared with ECG of 05-Aug-2020 21:48, PREVIOUS ECG IS PRESENT Atrial fibrillation Confirmed by Ezequiel Essex [phone]) on 01/25/2022 11:05:14 PM       Repeat after cardioversion.    Labs Reviewed  CBC - Abnormal; Notable for the following components:      Result Value   RBC 3.06 (*)    Hemoglobin 11.0 (*)    HCT 33.0 (*)    MCV 107.8 (*)    MCH 35.9 (*)    RDW 16.4 (*)    All other components within normal limits  BASIC METABOLIC PANEL - Abnormal; Notable for the following components:   Glucose, Bld 120 (*)    BUN 24 (*)    Creatinine, Ser 1.24 (*)    GFR, Estimated 45 (*)    All other components within normal limits    No orders to display    Medications  lactated ringers infusion (0 mLs Intravenous Stopped 01/26/22 0133)  propofol (DIPRIVAN) 10 mg/mL bolus/IV push (30 mcg Intravenous Given 01/25/22 2316)     Procedures  /  Critical Care .Critical Care  Performed by: Montine Circle, PA-C Authorized by: Montine Circle, PA-C   Critical care provider statement:    Critical care time (minutes):  30  Critical care was time spent personally by me on the following activities:  Development of treatment plan with patient or surrogate, discussions with consultants, evaluation of patient's response to treatment, examination of patient, ordering and review of laboratory studies, ordering and review of radiographic studies, ordering and performing treatments and interventions, pulse oximetry, re-evaluation of patient's condition and review of old charts .Cardioversion  Date/Time: 01/25/2022 11:24 PM  Performed by: Montine Circle, PA-C Authorized by: Montine Circle, PA-C   Consent:    Consent obtained:  Written and emergent situation   Consent given by:  Patient   Risks discussed:   Cutaneous burn, death, induced arrhythmia and pain   Alternatives discussed:  Rate-control medication and observation Pre-procedure details:    Cardioversion basis:  Emergent   Rhythm:  Atrial fibrillation   Electrode placement:  Anterior-posterior Patient sedated: Yes. Refer to sedation procedure documentation for details of sedation.  Attempt one:    Cardioversion mode:  Synchronous   Waveform:  Biphasic   Shock (Joules):  120   Shock outcome:  Conversion to normal sinus rhythm Post-procedure details:    Patient status:  Awake   Patient tolerance of procedure:  Tolerated well, no immediate complications   ED Course and Medical Decision Making  I have reviewed the triage vital signs, the nursing notes, and pertinent available records from the EMR.  Social Determinants Affecting Complexity of Care: Patient has no clinically significant social determinants affecting this chief complaint..   ED Course:    Medical Decision Making Patient here with A-fib with RVR.  Onset was this evening about 8:30 PM.  She is appropriately anticoagulated on Eliquis and has been compliant with her medications.  Patient seems to be a good candidate for electrical cardioversion.  Risks and benefits were discussed with the patient.  She elects to proceed.  Dr. Wyvonnia Dusky sedated the patient and I cardioverted.  See procedure notes.  Patient was observed for several hours following the cardioversion.  She had taken a metoprolol prior to her arrival in the emergency department, and states that her blood pressure normally runs low.  I suspect that her blood pressures have been in the 90s because of the metoprolol.  I offered to continue observing her in the emergency department, or offered her discharge.  She ultimately said that she felt well enough to be discharged.  She was advised to return at any time.  Amount and/or Complexity of Data Reviewed Labs: ordered. ECG/medicine tests:  ordered.  Risk Prescription drug management.     Consultants: No consultations were needed in caring for this patient.   Treatment and Plan: I considered admission due to patient's initial presentation, but after considering the examination and diagnostic results, patient will not require admission and can be discharged with outpatient follow-up.    Final Clinical Impressions(s) / ED Diagnoses     ICD-10-CM   1. Atrial fibrillation with RVR [facility])  I48.91       ED Discharge Orders          Ordered    Amb referral to AFIB Clinic        01/26/22 0224              Discharge Instructions Discussed with and Provided to Patient:     Discharge Instructions      Please follow-up with your cardiologist.  If your symptoms change or worsen, return to the emergency department.        Montine Circle, PA-C 01/26/22 [phone]  Ezequiel Essex, MD 01/26/22 2113

## 2022-01-25 NOTE — ED Triage Notes (Signed)
Pt arrived via [facility] for cc of palpitations from home. Pt was walking around and began having palpitations with lightheadedness and dizziness. Upon EMS arrival, pt was in Afib RVR (120-150bpm). EMS administered 500cc bolus, rate decreased to 110-120 bpm. Pt alert and oriented. Prior episode of same 5 months ago. 18g RAC.   PTA EMS Vitals  HR 110-150 afib BP 112/68 RR 16 SPO2 96%

## 2022-01-26 ENCOUNTER — Other Ambulatory Visit: Payer: Self-pay

## 2022-01-26 NOTE — Discharge Instructions (Addendum)
Please follow-up with your cardiologist.  If your symptoms change or worsen, return to the emergency department.

## 2022-01-26 NOTE — ED Notes (Signed)
Patient ambulatory to the bathroom.

## 2022-01-26 NOTE — ED Notes (Signed)
Pt ambulated to the bathroom w/ standby assist.  She denied any dizziness but states she feels "a little unsteady" attributing it to "laying down for so long."  Vitals remain WNL.

## 2022-02-06 ENCOUNTER — Ambulatory Visit: Payer: Medicare PPO | Admitting: Nurse Practitioner

## 2022-02-09 ENCOUNTER — Ambulatory Visit (HOSPITAL_COMMUNITY)
Admission: RE | Admit: 2022-02-09 | Discharge: 2022-02-09 | Disposition: A | Discharge status: Home / Self Care | Payer: Medicare PPO | Source: Ambulatory Visit | Attending: Nurse Practitioner | Admitting: Nurse Practitioner

## 2022-02-09 ENCOUNTER — Encounter (HOSPITAL_COMMUNITY): Payer: Self-pay | Admitting: Nurse Practitioner

## 2022-02-09 VITALS — BP 92/56 | HR 61 | Ht 62.0 in | Wt 121.6 lb

## 2022-02-09 DIAGNOSIS — Z7901 Long term (current) use of anticoagulants: Secondary | ICD-10-CM | POA: Insufficient documentation

## 2022-02-09 DIAGNOSIS — D6869 Other thrombophilia: Secondary | ICD-10-CM | POA: Diagnosis not present

## 2022-02-09 DIAGNOSIS — I480 Paroxysmal atrial fibrillation: Secondary | ICD-10-CM | POA: Diagnosis not present

## 2022-02-09 DIAGNOSIS — I4891 Unspecified atrial fibrillation: Secondary | ICD-10-CM | POA: Diagnosis present

## 2022-02-09 DIAGNOSIS — R002 Palpitations: Secondary | ICD-10-CM | POA: Diagnosis not present

## 2022-02-09 NOTE — Progress Notes (Addendum)
Primary Care Physician: [name], PA-C Referring Physician:[facility] ED f/u Cardiologist: Dr. Lillia Corporal Samantha Keller is a 76 y.o. female with a h/o afib on eliquis was seen in the ED 11/01 for onset of palpitations. EMS was called and found pt in afib with HR"s in the 150's with BP's in the 90's. She was on eliquis and reported no missed doses. She was successfully cardioverted and then discharged. She developed afib in 2021 during a hospitalization for sepsis.   She is in SR today. She was advised to take metoprolol daily but with a HR of 61 bpm and a BP in the low 90's, she is afraid of driving her BP/HR lower. She tries to walk frequently, keep her weight down and eat healthy. This was her first cardioversion, 4th episode of afib.   Today, she denies symptoms of palpitations, chest pain, shortness of breath, orthopnea, PND, lower extremity edema, dizziness, presyncope, syncope, or neurologic sequela. The patient is tolerating medications without difficulties and is otherwise without complaint today.   Past Medical History:  Diagnosis Date   Abscess of bursa of right hip    Acute kidney failure (HCC)    Alcohol withdrawal (HCC)    Asthma    Cellulitis of unspecified part of limb    Chronic tension type headache    Depression    Dysphagia, oropharyngeal phase    Essential (primary) hypertension    Hyperosmolality and/or hypernatremia    Hypovolemic shock (HCC)    Iron deficiency anemia    LBBB (left bundle branch block)    Malignant neoplasm of upper lobe bronchus, left (HCC)    Metabolic encephalopathy    MSSA bacteremia 2021   Muscle weakness (generalized)    Peritoneal abscess (HCC)    Pneumonia    RA (rheumatoid arthritis) (HCC)    Respiratory failure (HCC)    Staph infection    UGIB (upper gastrointestinal bleed) 2021   Unspecified protein-calorie malnutrition ([place])    Past Surgical History:  Procedure Laterality Date   ABCESS DRAINAGE Bilateral 2021   ABDOMINAL  HYSTERECTOMY     BREAST ENHANCEMENT SURGERY     CERVICAL FUSION     COLONOSCOPY     about 5 years ago. Been doing cologuard   ESOPHAGOGASTRODUODENOSCOPY     [place]    EYE SURGERY Bilateral    lens replacement   INCISION AND DRAINAGE OF WOUND Bilateral 2021   hip, thigh   IR THORACENTESIS ASP PLEURAL SPACE W/IMG GUIDE  06/20/2019   LUMBAR FUSION     RIGHT/LEFT HEART CATH AND CORONARY ANGIOGRAPHY N/A 06/27/2019   Procedure: RIGHT/LEFT HEART CATH AND CORONARY ANGIOGRAPHY;  Surgeon: Martinique, Peter M, MD;  Location: [facility];  Service: Cardiovascular;  Laterality: N/A;   TONSILLECTOMY      Current Outpatient Medications  Medication Sig Dispense Refill   Ascorbic Acid (VITAMIN C) 500 MG CAPS Take 500 mg by mouth daily.     Cholecalciferol (VITAMIN D3) 25 MCG (1000 UT) CAPS 1 capsule     cycloSPORINE (RESTASIS) 0.05 % ophthalmic emulsion Place 1 drop into both eyes 2 (two) times daily.      cycloSPORINE (RESTASIS) 0.05 % ophthalmic emulsion 1 drop into affected eye (Patient not taking: Reported on 07/20/2021)     DULoxetine (CYMBALTA) 60 MG capsule Take 60 mg by mouth daily.     ELIQUIS 5 MG TABS tablet TAKE 1 TABLET(5 MG) BY MOUTH TWICE DAILY 60 tablet 6  ENTRESTO 24-26 MG TAKE 1 TABLET BY MOUTH TWICE DAILY 180 tablet 3   estradiol (CLIMARA - DOSED IN MG/24 HR) 0.0375 mg/24hr patch Place 0.0375 mg onto the skin as directed. Apply one patch to skin two times a week on sundays and wednesdays (Patient not taking: Reported on 07/20/2021)     ferrous sulfate 325 (65 FE) MG tablet Take 325 mg by mouth daily with breakfast.     folic acid (FOLVITE) 1 MG tablet Take 2 mg by mouth daily.     gabapentin (NEURONTIN) 600 MG tablet Take 600 mg by mouth 3 (three) times daily.     melatonin 5 MG TABS Take 5 mg by mouth at bedtime.     methotrexate (RHEUMATREX) 2.5 MG tablet Take 10 mg by mouth once a week. Per patient taking 6 tablets once a week totally 15 mg     metoprolol  tartrate (LOPRESSOR) 25 MG tablet Take 1 tablet (25 mg total) by mouth as needed (Palpitations). 30 tablet 3   Omega-3 Fatty Acids (FISH OIL) 500 MG CAPS Take by mouth as needed.     pantoprazole (PROTONIX) 40 MG tablet Take 40 mg by mouth daily.      traZODone (DESYREL) 50 MG tablet Take 50 mg by mouth at bedtime.     No current facility-administered medications for this encounter.    Allergies  Allergen Reactions   Iodinated Contrast Media Anaphylaxis   Shellfish Allergy Anaphylaxis   Shellfish-Derived Products Anaphylaxis    Social History   Socioeconomic History   Marital status: Divorced    Spouse name: Not on file   Number of children: 2   Years of education: Not on file   Highest education level: Not on file  Occupational History   Not on file  Tobacco Use   Smoking status: Former    Packs/day: 2.00    Years: 20.00    Total pack years: 40.00    Types: Cigarettes    Quit date: 06/05/1982    Years since quitting: 39.7   Smokeless tobacco: Never  Vaping Use   Vaping Use: Never used  Substance and Sexual Activity   Alcohol use: Yes    Alcohol/week: 1.0 standard drink of alcohol    Types: 1 Glasses of wine per week    Comment: 1 glass per night   Drug use: Never   Sexual activity: Not Currently  Other Topics Concern   Not on file  Social History Narrative   Not on file   Social Determinants of Health   Financial Resource Strain: Not on file  Food Insecurity: Not on file  Transportation Needs: Not on file  Physical Activity: Not on file  Stress: Not on file  Social Connections: Not on file  Intimate Partner Violence: Not on file    Family History  Problem Relation Age of Onset   Asthma Mother    Asthma Father    COPD Father    CAD Father    Asthma Sister    Breast cancer Paternal Aunt    High Cholesterol Brother    Colon cancer Neg Hx     ROS- All systems are reviewed and negative except as per the HPI above  Physical Exam: There were no  vitals filed for this visit. Wt Readings from Last 3 Encounters:  01/26/22 54.4 kg  07/20/21 55.7 kg  04/15/21 54.4 kg    Labs: Lab Results  Component Value Date   NA 140 01/25/2022   K 4.0  01/25/2022   CL 108 01/25/2022   CO2 23 01/25/2022   GLUCOSE 120 (H) 01/25/2022   BUN 24 (H) 01/25/2022   CREATININE 1.24 (H) 01/25/2022   CALCIUM 8.9 01/25/2022   PHOS 3.3 06/25/2019   MG 1.8 06/25/2019   Lab Results  Component Value Date   INR 1.1 06/19/2019   No results found for: "CHOL", "HDL", "LDLCALC", "TRIG"   GEN- The patient is well appearing, alert and oriented x 3 today.   Head- normocephalic, atraumatic Eyes-  Sclera clear, conjunctiva pink Ears- hearing intact Oropharynx- clear Neck- supple, no JVP Lymph- no cervical lymphadenopathy Lungs- Clear to ausculation bilaterally, normal work of breathing Heart- Regular rate and rhythm, no murmurs, rubs or gallops, PMI not laterally displaced GI- soft, NT, ND, + BS Extremities- no clubbing, cyanosis, or edema MS- no significant deformity or atrophy Skin- no rash or lesion Psych- euthymic mood, full affect Neuro- strength and sensation are intact  EKG-Vent. rate 61 BPM PR interval 150 ms QRS duration 122 ms QT/QTcB 440/442 ms P-R-T axes 62 -4 88 Normal sinus rhythm Left bundle branch block Abnormal ECG When compared with ECG of 25-Jan-2022 23:23, PREVIOUS ECG IS PRESENT  Echo- 2022-1. Left ventricular ejection fraction, by estimation, is 55 to 60%. The  left ventricle has normal function. The left ventricle has no regional  wall motion abnormalities. Left ventricular diastolic parameters are  consistent with Grade II diastolic  dysfunction (pseudonormalization).   2. Right ventricular systolic function is normal. The right ventricular  size is normal.   3. Left atrial size was mildly dilated.   4. The mitral valve is normal in structure. Trivial mitral valve  regurgitation. No evidence of mitral stenosis.   5.  The aortic valve is normal in structure. There is mild calcification  of the aortic valve. Aortic valve regurgitation is not visualized. Mild to  moderate aortic valve sclerosis/calcification is present, without any  evidence of aortic stenosis.   6. The inferior vena cava is normal in size with greater than 50%  respiratory variability, suggesting right atrial pressure of 3 mmHg.    Assessment and Plan:  1. Afib Recent successful cardioversion in the ED setting for afib with  v rates in the 150's and soft BP_ at 90 systolic She does not want to take metoprolol on a daily basis since her BP and HR stay low  I discussed with her ablation vrs possible tikosyn and she would like to stay with with a conservative wait and watch approach.  She does have metoprolol to use as needed for rapid afib if BP allows and will continue with eliquis 5 mg bid for a CHA2DS2VASc score of 4 She does drink a glass of wine nightly and I discussed alcohol and incidence of afib.  Afib clinic as needed  Dr. Harrington Challenger as scheduled   Geroge Baseman. Jsoeph Podesta, [facility] [address] [place], [place] [address] [phone]

## 2022-02-20 NOTE — Progress Notes (Unsigned)
Office Visit    Patient Name: Samantha Keller Date of Encounter: 02/21/2022  Primary Care Provider:  [name], PA-C Primary Cardiologist:  [name], MD Primary Electrophysiologist: None  Chief Complaint    Samantha Keller is a 76 y.o. female with PMH of CHF, NICM, pulmonary HTN, LBBB, mitral regurgitation, tricuspid regurgitation, HTN, RA, upper GIB, CVA/TIA asthma who presents today for posthospital follow-up of AF with RVR.  Past Medical History    Past Medical History:  Diagnosis Date   Abscess of bursa of right hip    Acute kidney failure (HCC)    Alcohol withdrawal (HCC)    Asthma    Cellulitis of unspecified part of limb    Chronic tension type headache    Depression    Dysphagia, oropharyngeal phase    Essential (primary) hypertension    Hyperosmolality and/or hypernatremia    Hypovolemic shock (HCC)    Iron deficiency anemia    LBBB (left bundle branch block)    Malignant neoplasm of upper lobe bronchus, left (HCC)    Metabolic encephalopathy    MSSA bacteremia 2021   Muscle weakness (generalized)    Peritoneal abscess (HCC)    Pneumonia    RA (rheumatoid arthritis) (HCC)    Respiratory failure (HCC)    Staph infection    UGIB (upper gastrointestinal bleed) 2021   Unspecified protein-calorie malnutrition ([place])    Past Surgical History:  Procedure Laterality Date   ABCESS DRAINAGE Bilateral 2021   ABDOMINAL HYSTERECTOMY     BREAST ENHANCEMENT SURGERY     CERVICAL FUSION     COLONOSCOPY     about 5 years ago. Been doing cologuard   ESOPHAGOGASTRODUODENOSCOPY     [place]    EYE SURGERY Bilateral    lens replacement   INCISION AND DRAINAGE OF WOUND Bilateral 2021   hip, thigh   IR THORACENTESIS ASP PLEURAL SPACE W/IMG GUIDE  06/20/2019   LUMBAR FUSION     RIGHT/LEFT HEART CATH AND CORONARY ANGIOGRAPHY N/A 06/27/2019   Procedure: RIGHT/LEFT HEART CATH AND CORONARY ANGIOGRAPHY;  Surgeon: Martinique, Peter M, MD;  Location:  [facility];  Service: Cardiovascular;  Laterality: N/A;   TONSILLECTOMY      Allergies  Allergies  Allergen Reactions   Iodinated Contrast Media Anaphylaxis   Shellfish Allergy Anaphylaxis   Shellfish-Derived Products Anaphylaxis    History of Present Illness    EMIE SOMMERFELD  is a 76 year old female with the above mention past medical history who presents today for posthospital follow-up of AF with RVR.  Samantha Keller was initially seen during hospitalization for acute CHF in 2021.  She had a prior admission in [facility] with MSSA bacteremia.  She developed a left pleural effusion that was treated with thoracentesis.  She underwent LHC on 06/27/2019 that demonstrated no coronary artery disease.  She was discharged to rehab facility in [facility].  2D echo during admission was 20-25% with severe LE edema and shortness of breath with dyspnea on exertion.  She was started on IV Lasix for massive fluid overload and anasarca.  Patient had noted spiculated mass in right upper lobe which was thought to be resolving infection.  She declined LifeVest during hospitalization.  She was seen in follow-up by Richardson Dopp, PA and was noted to have low blood pressure.  She was noted to have a GI illness the day prior to her visit.  She was referred to  the advanced heart failure clinic for further management.  She presented to the ED 07/2020 with new onset atrial fibrillation.  She was transferred to the ED via EMS with heart rates in the 150s and blood pressures in the 60s over 40s.  She was started on Cardizem drip and converted back to sinus rhythm.  She was started on Eliquis for NOAC.  2D echo was completed that showed EF of 50-60% and patient was maintained on Entresto.    She was seen in the ED on 01/25/2022 with complaint of AF with RVR with palpitations.  She was cardioverted and discharged.  She was seen by Roderic Palau, NP at the AF clinic on 02/09/2022.  She was in sinus rhythm  with blood pressures in the 90s.  She requested to not take metoprolol daily since blood pressure and heart rate stay low.  Alternatives were discussed such as ablation and Tikosyn but patient elected to choose conservative approach with wait and watch.  Samantha Keller presents today for post ED follow-up.  Since last being seen in the office patient reports that she is doing much better and has not had any recurrence of tachycardia or palpitations since ED visit.  She is tolerating her current medications without any adverse reactions.  Her blood pressure today was 108/62 and heart rate was 67 bpm.  She is currently not on rate control medications but does have as needed metoprolol that is taken for increased palpitations.  During our visit we discussed the pathophysiology of atrial fibrillation and all questions were answered to patient's satisfaction.  Upon my discussion patient would like to trial taking metoprolol every other day and then building up to daily for rate control purposes.  In the event that she is unable to tolerate a daily dose of metoprolol we will refer to EP for further guidance on rhythm control.  Patient denies chest pain, palpitations, dyspnea, PND, orthopnea, nausea, vomiting, dizziness, syncope, edema, weight gain, or early satiety.   Home Medications    Current Outpatient Medications  Medication Sig Dispense Refill   Ascorbic Acid (VITAMIN C) 500 MG CAPS Take 500 mg by mouth daily.     Cholecalciferol (VITAMIN D3) 25 MCG (1000 UT) CAPS Take 1 capsule by mouth every morning.     cycloSPORINE (RESTASIS) 0.05 % ophthalmic emulsion Place 1 drop into both eyes 2 (two) times daily.      DULoxetine (CYMBALTA) 60 MG capsule Take 60 mg by mouth daily.     ELIQUIS 5 MG TABS tablet TAKE 1 TABLET(5 MG) BY MOUTH TWICE DAILY 60 tablet 6   ENTRESTO 24-26 MG TAKE 1 TABLET BY MOUTH TWICE DAILY 180 tablet 3   ferrous sulfate 325 (65 FE) MG tablet Take 325 mg by mouth daily with breakfast.      folic acid (FOLVITE) 1 MG tablet Take 2 mg by mouth daily.     gabapentin (NEURONTIN) 600 MG tablet Take 600 mg by mouth 3 (three) times daily.     melatonin 5 MG TABS Take 5 mg by mouth at bedtime.     methotrexate (RHEUMATREX) 2.5 MG tablet Take 10 mg by mouth once a week. Per patient taking 6 tablets once a week totally 15 mg     Multiple Vitamins-Minerals (MULTIVITAMIN ADULTS) TABS Take 1 tablet by mouth every morning.     pantoprazole (PROTONIX) 40 MG tablet Take 40 mg by mouth daily.      pravastatin (PRAVACHOL) 40 MG tablet Take 1 tablet by mouth  daily.     traZODone (DESYREL) 50 MG tablet Take 50 mg by mouth at bedtime.     valACYclovir (VALTREX) 500 MG tablet Take 500 mg by mouth daily.     metoprolol tartrate (LOPRESSOR) 25 MG tablet Take 0.5 tablets (12.5 mg total) by mouth every other day. 30 tablet 0   No current facility-administered medications for this visit.     Review of Systems  Please see the history of present illness.    (+) Shortness of breath with heavy exertion  All other systems reviewed and are otherwise negative except as noted above.  Physical Exam    Wt Readings from Last 3 Encounters:  02/21/22 122 lb 12.8 oz (55.7 kg)  02/09/22 121 lb 9.6 oz (55.2 kg)  01/26/22 120 lb (54.4 kg)   VS: Vitals:   02/21/22 1328  BP: 108/62  Pulse: 67  SpO2: 98%  ,Body mass index is 22.46 kg/m.  Constitutional:      Appearance: Healthy appearance. Not in distress.  Neck:     Vascular: JVD normal.  Pulmonary:     Effort: Pulmonary effort is normal.     Breath sounds: No wheezing. No rales. Diminished in the bases Cardiovascular:     Normal rate. Regular rhythm. Normal S1. Normal S2.      Murmurs: There is no murmur.  Edema:    Peripheral edema absent.  Abdominal:     Palpations: Abdomen is soft non tender. There is no hepatomegaly.  Skin:    General: Skin is warm and dry.  Neurological:     General: No focal deficit present.     Mental Status: Alert and  oriented to person, place and time.     Cranial Nerves: Cranial nerves are intact.  EKG/LABS/Other Studies Reviewed    ECG personally reviewed by me today -none completed today  Risk Assessment/Calculations:    CHA2DS2-VASc Score = 4   This indicates a 4.8% annual risk of stroke. The patient's score is based upon: CHF History: 1 HTN History: 0 Diabetes History: 0 Stroke History: 0 Vascular Disease History: 0 Age Score: 2 Gender Score: 1           Lab Results  Component Value Date   WBC 7.9 01/25/2022   HGB 11.0 (L) 01/25/2022   HCT 33.0 (L) 01/25/2022   MCV 107.8 (H) 01/25/2022   PLT 222 01/25/2022   Lab Results  Component Value Date   CREATININE 1.24 (H) 01/25/2022   BUN 24 (H) 01/25/2022   NA 140 01/25/2022   K 4.0 01/25/2022   CL 108 01/25/2022   CO2 23 01/25/2022   Lab Results  Component Value Date   ALT 8 08/20/2019   AST 18 08/20/2019   ALKPHOS 55 08/20/2019   BILITOT 0.6 08/20/2019   No results found for: "CHOL", "HDL", "LDLCALC", "LDLDIRECT", "TRIG", "CHOLHDL"  No results found for: "HGBA1C"  Assessment & Plan    1.  Paroxysmal atrial fibrillation: -Patient recently admitted with AF with RVR and underwent DCCV to sinus rhythm -Today patient is rate controlled today and in sinus rhythm by auscultation. -We will try taking metoprolol 12.5 mg every other day for the next 2 weeks and eventually building up to 12.5 mg daily. -CHA2DS2-VASc Score = 4 [CHF History: 1, HTN History: 0, Diabetes History: 0, Stroke History: 0, Vascular Disease History: 0, Age Score: 2, Gender Score: 1].  Therefore, the patient's annual risk of stroke is 4.8 %.      2.  Chronic systolic CHF/NICM: -2D echo completed with EF of 55 to 60% and no RWMA with grade 2 DD -Today patient is euvolemic on examination -Continue GDMT with Entresto 24/26 mg -Low sodium diet, fluid restriction <2L, and daily weights encouraged. Educated to contact our office for weight gain of 2 lbs  overnight or 5 lbs in one week.    3.  Essential hypertension: -Patient's blood pressure today was well-controlled at 108/62 -Continue metoprolol as noted above  4.  Mitral valve insufficiency: -2D echo completed in 2022 with EF of 55-60% and mild MV noted. -Blood pressures today are within normal limits and patient is euvolemic on exam.  Disposition: Follow-up with [name], MD or APP in 3 months   Medication Adjustments/Labs and Tests Ordered: Current medicines are reviewed at length with the patient today.  Concerns regarding medicines are outlined above.   Signed, Mable Fill, Marissa Nestle, NP 02/21/2022, 2:14 PM [facility]  Note:  This document was prepared using Dragon voice recognition software and may include unintentional dictation errors.

## 2022-02-21 ENCOUNTER — Ambulatory Visit: Payer: Medicare PPO | Attending: Nurse Practitioner | Admitting: Nurse Practitioner

## 2022-02-21 ENCOUNTER — Encounter: Payer: Self-pay | Admitting: Nurse Practitioner

## 2022-02-21 VITALS — BP 108/62 | HR 67 | Ht 62.0 in | Wt 122.8 lb

## 2022-02-21 DIAGNOSIS — I5040 Unspecified combined systolic (congestive) and diastolic (congestive) heart failure: Secondary | ICD-10-CM

## 2022-02-21 DIAGNOSIS — I480 Paroxysmal atrial fibrillation: Secondary | ICD-10-CM

## 2022-02-21 DIAGNOSIS — I10 Essential (primary) hypertension: Secondary | ICD-10-CM | POA: Diagnosis not present

## 2022-02-21 DIAGNOSIS — Z8679 Personal history of other diseases of the circulatory system: Secondary | ICD-10-CM

## 2022-02-21 DIAGNOSIS — I340 Nonrheumatic mitral (valve) insufficiency: Secondary | ICD-10-CM

## 2022-02-21 MED ORDER — METOPROLOL TARTRATE 25 MG PO TABS: 12.5000 mg | ORAL_TABLET | ORAL | 0 refills | Status: DC

## 2022-02-21 NOTE — Patient Instructions (Addendum)
Medication Instructions:  START Metoprolol 12.5mg  Take 1 tablet every other day  *If you need a refill on your cardiac medications before your next appointment, please call your pharmacy*  Lab Work: None Ordered   Testing/Procedures: None Ordered   Follow-Up: At [facility], you and your health needs are our priority.  As part of our continuing mission to provide you with exceptional heart care, we have created designated Provider Care Teams.  These Care Teams include your primary Cardiologist (physician) and Advanced Practice Providers (APPs -  Physician Assistants and Nurse Practitioners) who all work together to provide you with the care you need, when you need it.  We recommend signing up for the patient portal called "MyChart".  Sign up information is provided on this After Visit Summary.  MyChart is used to connect with patients for Virtual Visits (Telemedicine).  Patients are able to view lab/test results, encounter notes, upcoming appointments, etc.  Non-urgent messages can be sent to your provider as well.   To learn more about what you can do with MyChart, go to [email or web address].    Your next appointment:   3 month(s)  The format for your next appointment:   In Person  Provider:   Ambrose Pancoast, NP       Other Instructions CHECK YOUR BLOOD PRESSURE DAILY FOR 2 WEEKS [place]

## 2022-04-28 ENCOUNTER — Emergency Department (HOSPITAL_BASED_OUTPATIENT_CLINIC_OR_DEPARTMENT_OTHER): Payer: Medicare PPO

## 2022-04-28 ENCOUNTER — Other Ambulatory Visit: Payer: Self-pay

## 2022-04-28 ENCOUNTER — Emergency Department (HOSPITAL_BASED_OUTPATIENT_CLINIC_OR_DEPARTMENT_OTHER)
Admission: EM | Admit: 2022-04-28 | Discharge: 2022-04-28 | Disposition: A | Discharge status: Home / Self Care | Payer: Medicare PPO | Attending: Emergency Medicine | Admitting: Emergency Medicine

## 2022-04-28 ENCOUNTER — Encounter (HOSPITAL_BASED_OUTPATIENT_CLINIC_OR_DEPARTMENT_OTHER): Payer: Self-pay | Admitting: Emergency Medicine

## 2022-04-28 DIAGNOSIS — W010XXA Fall on same level from slipping, tripping and stumbling without subsequent striking against object, initial encounter: Secondary | ICD-10-CM | POA: Diagnosis not present

## 2022-04-28 DIAGNOSIS — S80211A Abrasion, right knee, initial encounter: Secondary | ICD-10-CM | POA: Diagnosis not present

## 2022-04-28 DIAGNOSIS — M79675 Pain in left toe(s): Secondary | ICD-10-CM | POA: Diagnosis not present

## 2022-04-28 DIAGNOSIS — S8002XA Contusion of left knee, initial encounter: Secondary | ICD-10-CM | POA: Diagnosis not present

## 2022-04-28 DIAGNOSIS — W19XXXA Unspecified fall, initial encounter: Secondary | ICD-10-CM

## 2022-04-28 DIAGNOSIS — M25561 Pain in right knee: Secondary | ICD-10-CM | POA: Diagnosis present

## 2022-04-28 DIAGNOSIS — Z7901 Long term (current) use of anticoagulants: Secondary | ICD-10-CM | POA: Insufficient documentation

## 2022-04-28 NOTE — ED Notes (Signed)
Pt ambulated unassisted to restroom

## 2022-04-28 NOTE — ED Notes (Signed)
Pt inquired how much longer before d/c, EDP Elmo Putt, PA notified

## 2022-04-28 NOTE — Discharge Instructions (Addendum)
You were evaluated in the emergency department for bodily pains following a fall yesterday.  Imaging of knees and left foot/toes did not show fracture or dislocation.  Do however please follow-up with a foot and ankle specialist for further evaluation on your toe and possibility of missed fracture.  You may continue taking Tylenol as needed for pain management.  Return to the ED for new or worsening symptoms as discussed.

## 2022-04-28 NOTE — ED Triage Notes (Signed)
Left pain , injury post fall today while walking , reports tripped , left knee abrasion . On Eliquis .  Denies head injury , no loc

## 2022-04-28 NOTE — ED Provider Notes (Signed)
[facility] Provider Note   CSN: [id number] Arrival date & time: 04/28/22  1223     History  Chief Complaint  Patient presents with   Foot Injury    left    Samantha Keller is a 77 y.o. female presents to the ED with bodily pains after falling forward onto her hands and knees.  Patient states she was out on a walk, which she normally does.  States her foot had gotten caught on the sidewalk and caused her to sprawled forward.  She landed on her knees and was concerned she may have fractured her toe.  Denies chest pain, shortness of breath, dizziness, lightheadedness before or after the fall.  Did not hit head or lose consciousness.  Still able to bear weight without issue.  Notices an abrasion to the right knee, and bruising to the left.  No other complaints at this time.  The history is provided by the patient and medical records.  Foot Injury     Home Medications Prior to Admission medications   Medication Sig Start Date End Date Taking? Authorizing Provider  Ascorbic Acid (VITAMIN C) 500 MG CAPS Take 500 mg by mouth daily.    [provider]  Cholecalciferol (VITAMIN D3) 25 MCG (1000 UT) CAPS Take 1 capsule by mouth every morning.    [provider]  cycloSPORINE (RESTASIS) 0.05 % ophthalmic emulsion Place 1 drop into both eyes 2 (two) times daily.     [provider]  DULoxetine (CYMBALTA) 60 MG capsule Take 60 mg by mouth daily.    [provider]  ELIQUIS 5 MG TABS tablet TAKE 1 TABLET(5 MG) BY MOUTH TWICE DAILY 06/17/21   Fay Records, MD  ENTRESTO 24-26 MG TAKE 1 TABLET BY MOUTH TWICE DAILY 09/14/21   Fay Records, MD  ferrous sulfate 325 (65 FE) MG tablet Take 325 mg by mouth daily with breakfast.    [provider]  folic acid (FOLVITE) 1 MG tablet Take 2 mg by mouth daily.    [provider]  gabapentin (NEURONTIN) 600 MG tablet Take 600 mg by mouth 3 (three) times daily.     [provider]  melatonin 5 MG TABS Take 5 mg by mouth at bedtime.    [provider]  methotrexate (RHEUMATREX) 2.5 MG tablet Take 10 mg by mouth once a week. Per patient taking 6 tablets once a week totally 15 mg 04/21/20   [provider]  metoprolol tartrate (LOPRESSOR) 25 MG tablet Take 0.5 tablets (12.5 mg total) by mouth every other day. 02/21/22   Marylu Lund., NP  Multiple Vitamins-Minerals (MULTIVITAMIN ADULTS) TABS Take 1 tablet by mouth every morning.    [provider]  pantoprazole (PROTONIX) 40 MG tablet Take 40 mg by mouth daily.     [provider]  pravastatin (PRAVACHOL) 40 MG tablet Take 1 tablet by mouth daily. 01/25/22   [provider]  traZODone (DESYREL) 50 MG tablet Take 50 mg by mouth at bedtime. 12/10/19   [provider]  valACYclovir (VALTREX) 500 MG tablet Take 500 mg by mouth daily. 01/25/22   [provider]      Allergies    Iodinated contrast media, Shellfish allergy, and Shellfish-derived products    Review of Systems   Review of Systems  Musculoskeletal:        Knee pain, toe pain    Physical Exam Updated Vital Signs BP Marland Kitchen)  107/51   Pulse 63   Temp 98.4 F (36.9 C) (Oral)   Resp 14   Wt 54 kg   SpO2 97%   BMI 21.77 kg/m  Physical Exam Vitals and nursing note reviewed.  Constitutional:      General: She is not in acute distress.    Appearance: She is well-developed. She is not ill-appearing, toxic-appearing or diaphoretic.  HENT:     Head: Normocephalic and atraumatic.  Eyes:     Conjunctiva/sclera: Conjunctivae normal.  Cardiovascular:     Rate and Rhythm: Normal rate and regular rhythm.     Heart sounds: No murmur heard. Pulmonary:     Effort: Pulmonary effort is normal. No respiratory distress.     Breath sounds: Normal breath sounds.  Abdominal:     Palpations: Abdomen is soft.     Tenderness: There is no abdominal tenderness.  Musculoskeletal:         General: No swelling.     Cervical back: Neck supple. No rigidity.     Comments: Mild abrasion to the right knee no larger than 1.5 cm, no active bleeding.  Mild ecchymosis to the left knee, mild tenderness to the touch.  No bony tenderness.  Full ROM and strength of lower extremities.  Lower extremities.  Grossly neurovascular intact.  Mild tenderness to the first metatarsal/tarsometatarsal joint, however no swelling.  Able to wiggle toes and bear weight without difficulty.  CRT less than 2.  DP and PT 2+ .  Skin:    General: Skin is warm and dry.     Capillary Refill: Capillary refill takes less than 2 seconds.  Neurological:     Mental Status: She is alert and oriented to person, place, and time.  Psychiatric:        Mood and Affect: Mood normal.     ED Results / Procedures / Treatments   Labs (all labs ordered are listed, but only abnormal results are displayed) Labs Reviewed - No data to display  EKG None  Radiology DG Knee Complete 4 Views Left  Result Date: 04/28/2022 CLINICAL DATA:  Trip and fall, pain EXAM: LEFT KNEE - COMPLETE 4+ VIEW; RIGHT KNEE - COMPLETE 4+ VIEW COMPARISON:  None Available. FINDINGS: No evidence of fracture, dislocation, or joint effusion. No evidence of arthropathy or other focal bone abnormality. Minimal enthesopathic change of the superior pole of the right patella. Soft tissue edema anteriorly. IMPRESSION: 1. No fracture or dislocation of the bilateral knees. Joint spaces are preserved. No knee joint effusion. 2.  Soft tissue edema anteriorly. Electronically Signed   By: Delanna Ahmadi M.D.   On: 04/28/2022 13:08   DG Knee Complete 4 Views Right  Result Date: 04/28/2022 CLINICAL DATA:  Trip and fall, pain EXAM: LEFT KNEE - COMPLETE 4+ VIEW; RIGHT KNEE - COMPLETE 4+ VIEW COMPARISON:  None Available. FINDINGS: No evidence of fracture, dislocation, or joint effusion. No evidence of arthropathy or other focal bone abnormality. Minimal enthesopathic change of the  superior pole of the right patella. Soft tissue edema anteriorly. IMPRESSION: 1. No fracture or dislocation of the bilateral knees. Joint spaces are preserved. No knee joint effusion. 2.  Soft tissue edema anteriorly. Electronically Signed   By: Delanna Ahmadi M.D.   On: 04/28/2022 13:08   DG Foot Complete Left  Result Date: 04/28/2022 CLINICAL DATA:  Left foot pain EXAM: LEFT FOOT - COMPLETE 3+ VIEW COMPARISON:  04/15/2021 FINDINGS: No acute fracture or dislocation. No aggressive osseous lesion. Normal alignment. Hallux  valgus with mild osteoarthritis of the first MTP joint. Old healed third metatarsal shaft fracture. Small plantar calcaneal spur. Enthesopathic changes of the Achilles tendon insertion. Relative pes cavus. Soft tissue are unremarkable. No radiopaque foreign body or soft tissue emphysema. IMPRESSION: 1. Hallux valgus with mild osteoarthritis of the first MTP joint. Electronically Signed   By: Kathreen Devoid M.D.   On: 04/28/2022 13:03    Procedures Procedures    Medications Ordered in ED Medications - No data to display  ED Course/ Medical Decision Making/ A&P                             Medical Decision Making Amount and/or Complexity of Data Reviewed Radiology: ordered.   Patient is 77 year old female presenting to the ED with bilateral knee pain and left great toe pain following a mechanical fall earlier today.  Based on history and presentation, low suspicion for nonmechanical mechanism for fall.  Did not hit head or lose consciousness per patient.  On anticoagulation.  No chest pain, shortness of breath, dizziness, lightheadedness, abdominal pain, back pain, or vision changes.  Mild abrasion to the right knee and ecchymosis of the left knee appreciated on exam.  Lower extremities appear grossly neurovascularly intact.  Without bony deformity, leg shortening, malrotation, bony tenderness, weakness, tingling, or decrease sensation.  ROM and strength appears grossly intact of  BLE.  Abrasion dressed with bandage.  Ambulates without difficulty.  Patient X-Rays negative for obvious fracture or dislocation.  Pain managed in ED.  Pt advised to follow up with orthopedics/PCP if symptoms persist for possibility of missed fracture diagnosis.  Resources for local foot specialist provided for bunion management and reevaluation of left great toe.  Pt given ace wraps for knee support while in ED.  Conservative therapy recommended and discussed.  Patient reports satisfaction with today's encounter.  Patient in NAD and good condition at time of discharge.  After consideration the patient's encounter today, I do not feel today's workup suggests an emergent condition requiring admission or immediate intervention beyond what has been performed at this time.  Safe for discharge; instructed to return immediately for worsening symptoms, change in symptoms or any other concerns.  I have reviewed the patients home medicines and have made adjustments as needed.  Discussed course of treatment with the patient, whom demonstrated understanding.  Patient in agreement and has no further questions.    This chart was dictated using voice recognition software.  Despite best efforts to proofread,  errors can occur which can change the documentation meaning.         Final Clinical Impression(s) / ED Diagnoses Final diagnoses:  Fall, initial encounter  Great toe pain, left  Acute bilateral knee pain    Rx / DC Orders ED Discharge Orders     None         Candace Cruise 63/87/56 [id number]    Sherwood Gambler, MD 05/04/22 [phone]

## 2022-04-29 ENCOUNTER — Encounter: Payer: Self-pay | Admitting: Internal Medicine

## 2022-05-01 MED ORDER — APIXABAN 5 MG PO TABS: ORAL_TABLET | ORAL | 1 refills | Status: DC

## 2022-05-04 ENCOUNTER — Encounter (HOSPITAL_COMMUNITY): Payer: Self-pay | Admitting: *Deleted

## 2022-05-23 NOTE — Progress Notes (Unsigned)
Office Visit    Patient Name: Samantha Keller Date of Encounter: 05/23/2022  Primary Care Provider:  [name], PA-C Primary Cardiologist:  [name], MD Primary Electrophysiologist: None  Chief Complaint    Samantha Keller is a 77 y.o. female with PMH of CHF, NICM, pulmonary HTN, LBBB, mitral regurgitation, tricuspid regurgitation, HTN, RA, upper GIB, CVA/TIA asthma who presents today for 3 month follow-up of AF with RVR.   Past Medical History    Past Medical History:  Diagnosis Date   Abscess of bursa of right hip    Acute kidney failure (HCC)    Alcohol withdrawal (HCC)    Asthma    Cellulitis of unspecified part of limb    Chronic tension type headache    Depression    Dysphagia, oropharyngeal phase    Essential (primary) hypertension    Hyperosmolality and/or hypernatremia    Hypovolemic shock (HCC)    Iron deficiency anemia    LBBB (left bundle branch block)    Malignant neoplasm of upper lobe bronchus, left (HCC)    Metabolic encephalopathy    MSSA bacteremia 2021   Muscle weakness (generalized)    Peritoneal abscess (HCC)    Pneumonia    RA (rheumatoid arthritis) (HCC)    Respiratory failure (HCC)    Staph infection    UGIB (upper gastrointestinal bleed) 2021   Unspecified protein-calorie malnutrition ([place])    Past Surgical History:  Procedure Laterality Date   ABCESS DRAINAGE Bilateral 2021   ABDOMINAL HYSTERECTOMY     BREAST ENHANCEMENT SURGERY     CERVICAL FUSION     COLONOSCOPY     about 5 years ago. Been doing cologuard   ESOPHAGOGASTRODUODENOSCOPY     [place]    EYE SURGERY Bilateral    lens replacement   INCISION AND DRAINAGE OF WOUND Bilateral 2021   hip, thigh   IR THORACENTESIS ASP PLEURAL SPACE W/IMG GUIDE  06/20/2019   LUMBAR FUSION     RIGHT/LEFT HEART CATH AND CORONARY ANGIOGRAPHY N/A 06/27/2019   Procedure: RIGHT/LEFT HEART CATH AND CORONARY ANGIOGRAPHY;  Surgeon: Martinique, Peter M, MD;  Location: [place] [facility];  Service: Cardiovascular;  Laterality: N/A;   TONSILLECTOMY      Allergies  Allergies  Allergen Reactions   Iodinated Contrast Media Anaphylaxis   Shellfish Allergy Anaphylaxis   Shellfish-Derived Products Anaphylaxis    History of Present Illness    Samantha Keller  is a 77 year old female with the above mention past medical history who presents today for posthospital follow-up of AF with RVR.  Samantha Keller was initially seen during hospitalization for acute CHF in 2021.  She had a prior admission in [facility] with MSSA bacteremia.  She developed a left pleural effusion that was treated with thoracentesis.  She underwent LHC on 06/27/2019 that demonstrated no coronary artery disease.  She was discharged to rehab facility in [facility].  2D echo during admission was 20-25% with severe LE edema and shortness of breath with dyspnea on exertion.  She was started on IV Lasix for massive fluid overload and anasarca.  Patient had noted spiculated mass in right upper lobe which was thought to be resolving infection.  She declined LifeVest during hospitalization.  She was seen in follow-up by Richardson Dopp, PA and was noted to have low blood pressure.  She was noted to have a GI illness the day prior to her visit.  She was  referred to the advanced heart failure clinic for further management.  She presented to the ED 07/2020 with new onset atrial fibrillation.  She was transferred to the ED via EMS with heart rates in the 150s and blood pressures in the 60s over 40s.  She was started on Cardizem drip and converted back to sinus rhythm.  She was started on Eliquis for NOAC.  2D echo was completed that showed EF of 50-60% and patient was maintained on Entresto. She was seen in the ED on 01/25/2022 with complaint of AF with RVR with palpitations.  She was cardioverted and discharged.  She was seen by Roderic Palau, NP at the AF clinic on 02/09/2022.  She was in sinus rhythm with blood  pressures in the 90s.  She requested to not take metoprolol daily since blood pressure and heart rate stay low.  Alternatives were discussed such as ablation and Tikosyn but patient elected to choose conservative approach with wait and watch.  She was seen in follow-up 02/21/2022 for post ED visit and patient denied any recurrence of symptoms.  Since last being seen in the office patient reports***.  Patient denies chest pain, palpitations, dyspnea, PND, orthopnea, nausea, vomiting, dizziness, syncope, edema, weight gain, or early satiety.     ***Notes:  Home Medications    Current Outpatient Medications  Medication Sig Dispense Refill   apixaban (ELIQUIS) 5 MG TABS tablet TAKE 1 TABLET(5 MG) BY MOUTH TWICE DAILY 180 tablet 1   Ascorbic Acid (VITAMIN C) 500 MG CAPS Take 500 mg by mouth daily.     Cholecalciferol (VITAMIN D3) 25 MCG (1000 UT) CAPS Take 1 capsule by mouth every morning.     cycloSPORINE (RESTASIS) 0.05 % ophthalmic emulsion Place 1 drop into both eyes 2 (two) times daily.      DULoxetine (CYMBALTA) 60 MG capsule Take 60 mg by mouth daily.     ENTRESTO 24-26 MG TAKE 1 TABLET BY MOUTH TWICE DAILY 180 tablet 3   ferrous sulfate 325 (65 FE) MG tablet Take 325 mg by mouth daily with breakfast.     folic acid (FOLVITE) 1 MG tablet Take 2 mg by mouth daily.     gabapentin (NEURONTIN) 600 MG tablet Take 600 mg by mouth 3 (three) times daily.     melatonin 5 MG TABS Take 5 mg by mouth at bedtime.     methotrexate (RHEUMATREX) 2.5 MG tablet Take 10 mg by mouth once a week. Per patient taking 6 tablets once a week totally 15 mg     metoprolol tartrate (LOPRESSOR) 25 MG tablet Take 0.5 tablets (12.5 mg total) by mouth every other day. 30 tablet 0   Multiple Vitamins-Minerals (MULTIVITAMIN ADULTS) TABS Take 1 tablet by mouth every morning.     pantoprazole (PROTONIX) 40 MG tablet Take 40 mg by mouth daily.      pravastatin (PRAVACHOL) 40 MG tablet Take 1 tablet by mouth daily.      traZODone (DESYREL) 50 MG tablet Take 50 mg by mouth at bedtime.     valACYclovir (VALTREX) 500 MG tablet Take 500 mg by mouth daily.     No current facility-administered medications for this visit.     Review of Systems  Please see the history of present illness.    (+)*** (+)***  All other systems reviewed and are otherwise negative except as noted above.  Physical Exam    Wt Readings from Last 3 Encounters:  04/28/22 119 lb (54 kg)  02/21/22 122 lb  12.8 oz (55.7 kg)  02/09/22 121 lb 9.6 oz (55.2 kg)   [id number] were no vitals filed for this visit.,There is no height or weight on file to calculate BMI.  Constitutional:      Appearance: Healthy appearance. Not in distress.  Neck:     Vascular: JVD normal.  Pulmonary:     Effort: Pulmonary effort is normal.     Breath sounds: No wheezing. No rales. Diminished in the bases Cardiovascular:     Normal rate. Regular rhythm. Normal S1. Normal S2.      Murmurs: There is no murmur.  Edema:    Peripheral edema absent.  Abdominal:     Palpations: Abdomen is soft non tender. There is no hepatomegaly.  Skin:    General: Skin is warm and dry.  Neurological:     General: No focal deficit present.     Mental Status: Alert and oriented to person, place and time.     Cranial Nerves: Cranial nerves are intact.  EKG/LABS/Other Studies Reviewed    ECG personally reviewed by me today - ***  Risk Assessment/Calculations:   {Does this patient have ATRIAL FIBRILLATION?:[id number]}        Lab Results  Component Value Date   WBC 7.9 01/25/2022   HGB 11.0 (L) 01/25/2022   HCT 33.0 (L) 01/25/2022   MCV 107.8 (H) 01/25/2022   PLT 222 01/25/2022   Lab Results  Component Value Date   CREATININE 1.24 (H) 01/25/2022   BUN 24 (H) 01/25/2022   NA 140 01/25/2022   K 4.0 01/25/2022   CL 108 01/25/2022   CO2 23 01/25/2022   Lab Results  Component Value Date   ALT 8 08/20/2019   AST 18 08/20/2019   ALKPHOS 55 08/20/2019   BILITOT  0.6 08/20/2019   No results found for: "CHOL", "HDL", "LDLCALC", "LDLDIRECT", "TRIG", "CHOLHDL"  No results found for: "HGBA1C"  Assessment & Plan    1.  Paroxysmal atrial fibrillation: -Patient recently admitted with AF with RVR and underwent DCCV to sinus rhythm -Today patient is rate controlled today and in sinus rhythm by auscultation. -We will try taking metoprolol 12.5 mg every other day for the next 2 weeks and eventually building up to 12.5 mg daily. -CHA2DS2-VASc Score = 4 [CHF History: 1, HTN History: 0, Diabetes History: 0, Stroke History: 0, Vascular Disease History: 0, Age Score: 2, Gender Score: 1].  Therefore, the patient's annual risk of stroke is 4.8 %.       2.  Chronic systolic CHF/NICM: -2D echo completed with EF of 55 to 60% and no RWMA with grade 2 DD -Today patient is euvolemic on examination -Continue GDMT with Entresto 24/26 mg -Low sodium diet, fluid restriction <2L, and daily weights encouraged. Educated to contact our office for weight gain of 2 lbs overnight or 5 lbs in one week.      3.  Essential hypertension: -Patient's blood pressure today was well-controlled at 108/62 -Continue metoprolol as noted above   4.  Mitral valve insufficiency: -2D echo completed in 2022 with EF of 55-60% and mild MV noted. -Blood pressures today are within normal limits and patient is euvolemic on exam.     Disposition: Follow-up with [name], MD or APP in *** months {Are you ordering a CV Procedure (e.g. stress test, cath, DCCV, TEE, etc)?   Press F2        :[id number]   Medication Adjustments/Labs and Tests Ordered: Current medicines are reviewed at length with the  patient today.  Concerns regarding medicines are outlined above.   Signed, Mable Fill, Marissa Nestle, NP 05/23/2022, 12:48 PM [facility]  Note:  This document was prepared using Dragon voice recognition software and may include unintentional dictation errors.

## 2022-05-24 ENCOUNTER — Encounter: Payer: Self-pay | Admitting: Nurse Practitioner

## 2022-05-24 ENCOUNTER — Ambulatory Visit: Payer: Medicare PPO | Attending: Nurse Practitioner | Admitting: Nurse Practitioner

## 2022-05-24 VITALS — BP 104/52 | HR 81 | Ht 62.0 in | Wt 121.0 lb

## 2022-05-24 DIAGNOSIS — I10 Essential (primary) hypertension: Secondary | ICD-10-CM | POA: Diagnosis not present

## 2022-05-24 DIAGNOSIS — I480 Paroxysmal atrial fibrillation: Secondary | ICD-10-CM | POA: Diagnosis not present

## 2022-05-24 DIAGNOSIS — I5021 Acute systolic (congestive) heart failure: Secondary | ICD-10-CM

## 2022-05-24 DIAGNOSIS — I340 Nonrheumatic mitral (valve) insufficiency: Secondary | ICD-10-CM | POA: Diagnosis not present

## 2022-05-24 MED ORDER — METOPROLOL TARTRATE 25 MG PO TABS: 12.5000 mg | ORAL_TABLET | ORAL | 0 refills | Status: DC | PRN

## 2022-05-24 NOTE — Patient Instructions (Signed)
Medication Instructions:  You can take Metoprolol as needed  *If you need a refill on your cardiac medications before your next appointment, please call your pharmacy*   Lab Work: None ordered   Testing/Procedures: None ordered   Follow-Up: At [facility], you and your health needs are our priority.  As part of our continuing mission to provide you with exceptional heart care, we have created designated Provider Care Teams.  These Care Teams include your primary Cardiologist (physician) and Advanced Practice Providers (APPs -  Physician Assistants and Nurse Practitioners) who all work together to provide you with the care you need, when you need it.  We recommend signing up for the patient portal called "MyChart".  Sign up information is provided on this After Visit Summary.  MyChart is used to connect with patients for Virtual Visits (Telemedicine).  Patients are able to view lab/test results, encounter notes, upcoming appointments, etc.  Non-urgent messages can be sent to your provider as well.   To learn more about what you can do with MyChart, go to [email or web address].    Your next appointment:   6 month(s)  Provider:   [name], MD     Other Instructions

## 2022-08-03 ENCOUNTER — Ambulatory Visit: Payer: Medicare PPO | Admitting: Cardiology

## 2022-08-03 ENCOUNTER — Encounter: Payer: Self-pay | Admitting: Cardiology

## 2022-08-03 VITALS — BP 121/57 | HR 68 | Resp 16 | Ht 62.0 in | Wt 119.0 lb

## 2022-08-03 DIAGNOSIS — I480 Paroxysmal atrial fibrillation: Secondary | ICD-10-CM

## 2022-08-03 DIAGNOSIS — I5020 Unspecified systolic (congestive) heart failure: Secondary | ICD-10-CM

## 2022-08-03 NOTE — Progress Notes (Signed)
Patient referred by Katherine Basset, PA-C for h/o HFrEF  Subjective:   Samantha Keller, female    DOB: [birth year], 77 y.o.   MRN: [id number]   Chief Complaint  Patient presents with   New Patient (Initial Visit)   Atrial Fibrillation   Congestive Heart Failure    HPI  77 y.o. Cac[place]asian female with hypertension, nonischemic cardiomyopathy-now with recovered EF, paroxysmal atrial fibrillation, rheumatoid arthritis, chart h/o GI bleed, here to establish care.  Patient the last seen by Dr. Huston Foley in 06/2021, was previously seen by Dr. Management for heart failure. It appears that patient had sepsis in 2021 is when EF was found tb low. There was no infiltrative disease noted onc ardiac MRI. EF subsequently increased to to 55-60% in 2022. She has had no recurrent exertional dyspnea, leg edema symptoms. Blood pressure always low normal, with 121/57 being higher than usual for her. She has had no recurrent palpitations symptoms since cardioversion in 01/2022. She has controlled rheumatoid arthritis. She denies any melena, hematochezia.   Past Medical History:  Diagnosis Date   Abscess of bursa of right hip    Acute kidney failure (HCC)    Alcohol withdrawal (HCC)    Asthma    Cellulitis of unspecified part of limb    Chronic tension type headache    Depression    Dysphagia, oropharyngeal phase    Essential (primary) hypertension    Hyperosmolality and/or hypernatremia    Hypovolemic shock (HCC)    Iron deficiency anemia    LBBB (left bundle branch block)    Malignant neoplasm of upper lobe bronchus, left (HCC)    Metabolic encephalopathy    MSSA bacteremia 2021   Muscle weakness (generalized)    Peritoneal abscess (HCC)    Pneumonia    RA (rheumatoid arthritis) (HCC)    Respiratory failure (HCC)    Staph infection    UGIB (upper gastrointestinal bleed) 2021   Unspecified protein-calorie malnutrition (HCC)      Past Surgical History:  Procedure Laterality Date    ABCESS DRAINAGE Bilateral 2021   ABDOMINAL HYSTERECTOMY     BREAST ENHANCEMENT SURGERY     CERVICAL FUSION     COLONOSCOPY     about 5 years ago. Been doing cologuard   ESOPHAGOGASTRODUODENOSCOPY     [facility]    EYE SURGERY Bilateral    lens replacement   INCISION AND DRAINAGE OF WOUND Bilateral 2021   hip, thigh   IR THORACENTESIS ASP PLEURAL SPACE W/IMG GUIDE  06/20/2019   LUMBAR FUSION     RIGHT/LEFT HEART CATH AND CORONARY ANGIOGRAPHY N/A 06/27/2019   Procedure: RIGHT/LEFT HEART CATH AND CORONARY ANGIOGRAPHY;  Surgeon: [place], Peter M, MD;  Location: [facility] INVASIVE CV LAB;  Service: Cardiovascular;  Laterality: N/A;   TONSILLECTOMY       Social History   Tobacco Use  Smoking Status Former   Packs/day: 2.00   Years: 20.00   Additional pack years: 0.00   Total pack years: 40.00   Types: Cigarettes   Quit date: 06/05/1982   Years since quitting: 40.1  Smokeless Tobacco Never    Social History   Substance and Sexual Activity  Alcohol Use Yes   Alcohol/week: 1.0 standard drink of alcohol   Types: 1 Glasses of wine per week   Comment: 1 glass per night     Family History  Problem Relation Age of Onset   Asthma Mother    Asthma Father  COPD Father    CAD Father    Asthma Sister    Breast cancer Paternal Aunt    High Cholesterol Brother    Colon cancer Neg Hx       Current Outpatient Medications:    apixaban (ELIQUIS) 5 MG TABS tablet, TAKE 1 TABLET(5 MG) BY MOUTH TWICE DAILY, Disp: 180 tablet, Rfl: 1   Ascorbic Acid (VITAMIN C) 500 MG CAPS, Take 500 mg by mouth daily., Disp: , Rfl:    Cholecalciferol (VITAMIN D3) 25 MCG (1000 UT) CAPS, Take 1 capsule by mouth every morning., Disp: , Rfl:    cycloSPORINE (RESTASIS) 0.05 % ophthalmic emulsion, Place 1 drop into both eyes 2 (two) times daily. , Disp: , Rfl:    DULoxetine (CYMBALTA) 60 MG capsule, Take 60 mg by mouth daily., Disp: , Rfl:    ENTRESTO 24-26 MG, TAKE 1 TABLET BY MOUTH TWICE  DAILY, Disp: 180 tablet, Rfl: 3   ferrous sulfate 325 (65 FE) MG tablet, Take 325 mg by mouth daily with breakfast., Disp: , Rfl:    folic acid (FOLVITE) 1 MG tablet, Take 2 mg by mouth daily., Disp: , Rfl:    gabapentin (NEURONTIN) 600 MG tablet, Take 600 mg by mouth 3 (three) times daily., Disp: , Rfl:    melatonin 5 MG TABS, Take 5 mg by mouth at bedtime., Disp: , Rfl:    methotrexate (RHEUMATREX) 2.5 MG tablet, Take 10 mg by mouth once a week. Per patient taking 6 tablets once a week totally 15 mg, Disp: , Rfl:    metoprolol tartrate (LOPRESSOR) 25 MG tablet, Take 0.5 tablets (12.5 mg total) by mouth as needed., Disp: 30 tablet, Rfl: 0   Multiple Vitamins-Minerals (MULTIVITAMIN ADULTS) TABS, Take 1 tablet by mouth every morning., Disp: , Rfl:    pantoprazole (PROTONIX) 40 MG tablet, Take 40 mg by mouth daily. , Disp: , Rfl:    pravastatin (PRAVACHOL) 40 MG tablet, Take 1 tablet by mouth daily., Disp: , Rfl:    traZODone (DESYREL) 50 MG tablet, Take 50 mg by mouth at bedtime., Disp: , Rfl:    valACYclovir (VALTREX) 500 MG tablet, Take 500 mg by mouth daily., Disp: , Rfl:    Cardiovascular and other pertinent studies:  Reviewed external labs and tests, independently interpreted  EKG 08/03/2022: Sinus rhythm 62 bpm  Low voltage in precordial leads LBBB  Cardioversion 01/2022  Cardiac telemetry 08/2020: Patch Wear Time:  14 days and 0 hours (2022-06-02T15:56:11-398 to 2022-06-16T15:56:13-0400)   Predominate rhythm is sinus with few short bursts of SVT, longest lasting 13.5 seconds.   Rates 50 to 176 bpm  Average HR 79 bpm Rare PVCs, PACs  No significant pauses     Echocardiogram 08/06/2020: 1. Left ventricular ejection fraction, by estimation, is 55 to 60%. The  left ventricle has normal function. The left ventricle has no regional  wall motion abnormalities. Left ventricular diastolic parameters are  consistent with Grade II diastolic  dysfunction (pseudonormalization).   2. Right  ventricular systolic function is normal. The right ventricular  size is normal.   3. Left atrial size was mildly dilated.   4. The mitral valve is normal in structure. Trivial mitral valve  regurgitation. No evidence of mitral stenosis.   5. The aortic valve is normal in structure. There is mild calcification  of the aortic valve. Aortic valve regurgitation is not visualized. Mild to  moderate aortic valve sclerosis/calcification is present, without any  evidence of aortic stenosis.   6. The  inferior vena cava is normal in size with greater than 50%  respiratory variability, suggesting right atrial pressure of 3 mmHg.   Cardiac MRI 2021: 1. Normal left ventricular size, thickness and systolic function (LVEF = 58% with paradoxical septal motion consistent with LBBB. There is no late gadolinium enhancement in the left ventricular myocardium. ECV 28%. 2. Normal right ventricular size, thickness and systolic function (RVEF = 61%). There are no regional wall motion abnormalities. 3.  Normal left and right atrial size. 4. Normal size of the aortic root, ascending aorta and pulmonary artery. 5.  Mild mitral and tricuspid regurgitation. 6.  Normal pericardium.  Trivial pericardial effusion.   Since the echocardiogram on 06/20/2019 there has been improvement of left and right ventricular function, both currently at the normal range. There is no evidence for infiltrative of inflammatory intramyocardial process.   RHC/LHC 2021: 1. Normal coronary anatomy 2. Moderate to severe LV dysfunction. EF estimated at 35% 3. Mildly elevated LV filling pressures.  4. Mild pulmonary HTN 5. Good cardiac output. Index 4.1   Recent labs: 05/30/2022: Glucose 92, BUN/Cr 25/1.1. EGFR 47. K 4.0 Hb 11.6 TSH 3.2 normal  01/25/2022: Glucose 120, BUN/Cr 24/1.24. EGFR 45. Na/K 140/4.0.  H/H 11/33. MCV 107. Platelets 222   Review of Systems  Cardiovascular:  Negative for chest pain, dyspnea on exertion, leg  swelling, palpitations and syncope.         Vitals:   08/03/22 1306  BP: (!) 121/57  Pulse: 68  Resp: 16  SpO2: 97%     Body mass index is 21.77 kg/m. Filed Weights   08/03/22 1306  Weight: 119 lb (54 kg)     Objective:   Physical Exam Vitals and nursing note reviewed.  Constitutional:      General: She is not in acute distress. Neck:     Vascular: No JVD.  Cardiovascular:     Rate and Rhythm: Normal rate and regular rhythm.     Heart sounds: Normal heart sounds. No murmur heard. Pulmonary:     Effort: Pulmonary effort is normal.     Breath sounds: Normal breath sounds. No wheezing or rales.  Musculoskeletal:     Right lower leg: No edema.     Left lower leg: No edema.         Visit diagnoses:   ICD-10-CM   1. PAF (paroxysmal atrial fibrillation) (HCC)  I48.0 EKG 12-Lead       Orders Placed This Encounter  Procedures   EKG 12-Lead     Medication changes this visit: Medications Discontinued During This Encounter  Medication Reason   ENTRESTO 24-26 MG Discontinued by provider      Assessment & Recommendations:   77 y.o. [place]asian female with hypertension, nonischemic cardiomyopathy-now with recovered EF, paroxysmal atrial fibrillation, rheumatoid arthritis, chart h/o GI bleed, here to establish care.  HFrEF: Occurred in the setting of sepsis in 2021, EF normalized to 55-60% in 2022. She is only on Entresto 24-26 mg bid. It is unlikely that her EF recovered only with low dose Entresto which makes me think her low EF was related to her sepsis with no recurrence. With her generally low blood pressures, we have mutually decided to discontinue Entresto. I will see her back in 3 months to make sure no recurrence of heart failure  PAF: In sinus rhythm. No known recurrence since 01/2022.  CHA2DS2VAS score 3, annual stroke risk 3.6%. Continue Eliquis 5 mg bid.     Thank you for referring the  patient to [place]. Please feel free to contact with any  questions.   Elder Negus, MD Pager: [phone] Office: [phone]

## 2022-09-18 ENCOUNTER — Other Ambulatory Visit: Payer: Self-pay | Admitting: Internal Medicine

## 2022-09-22 ENCOUNTER — Encounter: Payer: Self-pay | Admitting: Cardiology

## 2022-09-25 NOTE — Telephone Encounter (Signed)
I called. Went to [employer]. Below is from my note, which hopefully explain the rationale behind stopping Entresto. I do not think she is in congestive heart failure. Regardless, I am going to see her next month to re-assess how she is doing off Entresto. I hope this reassures.  HFrEF: Occurred in the setting of sepsis in 2021, EF normalized to 55-60% in 2022. She is only on Entresto 24-26 mg bid. It is unlikely that her EF recovered only with low dose Entresto which makes me think her low EF was related to her sepsis with no recurrence. With her generally low blood pressures, we have mutually decided to discontinue Entresto. I will see her back in 3 months to make sure no recurrence of heart failure

## 2022-09-25 NOTE — Telephone Encounter (Signed)
From pt

## 2022-10-06 ENCOUNTER — Other Ambulatory Visit: Payer: Self-pay | Admitting: Internal Medicine

## 2022-10-06 NOTE — Telephone Encounter (Signed)
Prescription refill request for Eliquis received. Indication:afib Last office visit:5/24 Scr:1.25  4/24 Age: 77 Weight:54  kg  Prescription refilled

## 2022-11-03 ENCOUNTER — Ambulatory Visit: Payer: Medicare PPO | Admitting: Cardiology

## 2022-11-03 ENCOUNTER — Encounter: Payer: Self-pay | Admitting: Cardiology

## 2022-11-03 VITALS — BP 116/59 | HR 77 | Resp 16 | Ht 62.0 in | Wt 120.4 lb

## 2022-11-03 DIAGNOSIS — I5020 Unspecified systolic (congestive) heart failure: Secondary | ICD-10-CM

## 2022-11-03 DIAGNOSIS — I480 Paroxysmal atrial fibrillation: Secondary | ICD-10-CM

## 2022-11-03 NOTE — Progress Notes (Unsigned)
Patient referred by Katherine Basset, PA-C for h/o HFrEF  Subjective:   Samantha Keller, female    DOB: Feb 15, 1946, 77 y.o.   MRN: [id number]   No chief complaint on file.   HPI  77 y.o. [place]asian female with hypertension, nonischemic cardiomyopathy-now with recovered EF, paroxysmal atrial fibrillation, rheumatoid arthritis, chart h/o GI bleed, here to establish care.  ***  Initial consultation visit ***: Patient the last seen by Dr. Huston Foley in 06/2021, was previously seen by Dr. Management for heart failure. It appears that patient had sepsis in 2021 is when EF was found tb low. There was no infiltrative disease noted onc ardiac MRI. EF subsequently increased to to 55-60% in 2022. She has had no recurrent exertional dyspnea, leg edema symptoms. Blood pressure always low normal, with 121/57 being higher than usual for her. She has had no recurrent palpitations symptoms since cardioversion in 01/2022. She has controlled rheumatoid arthritis. She denies any melena, hematochezia.    Current Outpatient Medications:    Ascorbic Acid (VITAMIN C) 500 MG CAPS, Take 500 mg by mouth daily., Disp: , Rfl:    Cholecalciferol (VITAMIN D3) 25 MCG (1000 UT) CAPS, Take 1 capsule by mouth every morning., Disp: , Rfl:    cycloSPORINE (RESTASIS) 0.05 % ophthalmic emulsion, Place 1 drop into both eyes 2 (two) times daily. , Disp: , Rfl:    DULoxetine (CYMBALTA) 60 MG capsule, Take 60 mg by mouth daily., Disp: , Rfl:    ELIQUIS 5 MG TABS tablet, TAKE 1 TABLET(5 MG) BY MOUTH TWICE DAILY, Disp: 180 tablet, Rfl: 1   ferrous sulfate 325 (65 FE) MG tablet, Take 325 mg by mouth daily with breakfast., Disp: , Rfl:    folic acid (FOLVITE) 1 MG tablet, Take 2 mg by mouth daily., Disp: , Rfl:    gabapentin (NEURONTIN) 600 MG tablet, Take 600 mg by mouth 3 (three) times daily., Disp: , Rfl:    melatonin 5 MG TABS, Take 5 mg by mouth at bedtime., Disp: , Rfl:    methotrexate (RHEUMATREX) 2.5 MG tablet, Take 10 mg  by mouth once a week. Per patient taking 6 tablets once a week totally 15 mg, Disp: , Rfl:    metoprolol tartrate (LOPRESSOR) 25 MG tablet, Take 0.5 tablets (12.5 mg total) by mouth as needed., Disp: 30 tablet, Rfl: 0   Multiple Vitamins-Minerals (MULTIVITAMIN ADULTS) TABS, Take 1 tablet by mouth every morning., Disp: , Rfl:    pantoprazole (PROTONIX) 40 MG tablet, Take 40 mg by mouth daily. , Disp: , Rfl:    pravastatin (PRAVACHOL) 40 MG tablet, Take 1 tablet by mouth daily., Disp: , Rfl:    traZODone (DESYREL) 50 MG tablet, Take 50 mg by mouth at bedtime., Disp: , Rfl:    valACYclovir (VALTREX) 500 MG tablet, Take 500 mg by mouth daily., Disp: , Rfl:    Cardiovascular and other pertinent studies:  Reviewed external labs and tests, independently interpreted  EKG 08/03/2022: Sinus rhythm 62 bpm  Low voltage in precordial leads LBBB  Cardioversion 01/2022  Cardiac telemetry 08/2020: Patch Wear Time:  14 days and 0 hours (2022-06-02T15:56:11-398 to 2022-06-16T15:56:13-0400)   Predominate rhythm is sinus with few short bursts of SVT, longest lasting 13.5 seconds.   Rates 50 to 176 bpm  Average HR 79 bpm Rare PVCs, PACs  No significant pauses     Echocardiogram 08/06/2020: 1. Left ventricular ejection fraction, by estimation, is 55 to 60%. The  left ventricle has normal function. The  left ventricle has no regional  wall motion abnormalities. Left ventricular diastolic parameters are  consistent with Grade II diastolic  dysfunction (pseudonormalization).   2. Right ventricular systolic function is normal. The right ventricular  size is normal.   3. Left atrial size was mildly dilated.   4. The mitral valve is normal in structure. Trivial mitral valve  regurgitation. No evidence of mitral stenosis.   5. The aortic valve is normal in structure. There is mild calcification  of the aortic valve. Aortic valve regurgitation is not visualized. Mild to  moderate aortic valve  sclerosis/calcification is present, without any  evidence of aortic stenosis.   6. The inferior vena cava is normal in size with greater than 50%  respiratory variability, suggesting right atrial pressure of 3 mmHg.   Cardiac MRI 2021: 1. Normal left ventricular size, thickness and systolic function (LVEF = 58% with paradoxical septal motion consistent with LBBB. There is no late gadolinium enhancement in the left ventricular myocardium. ECV 28%. 2. Normal right ventricular size, thickness and systolic function (RVEF = 61%). There are no regional wall motion abnormalities. 3.  Normal left and right atrial size. 4. Normal size of the aortic root, ascending aorta and pulmonary artery. 5.  Mild mitral and tricuspid regurgitation. 6.  Normal pericardium.  Trivial pericardial effusion.   Since the echocardiogram on 06/20/2019 there has been improvement of left and right ventricular function, both currently at the normal range. There is no evidence for infiltrative of inflammatory intramyocardial process.   RHC/LHC 2021: 1. Normal coronary anatomy 2. Moderate to severe LV dysfunction. EF estimated at 35% 3. Mildly elevated LV filling pressures.  4. Mild pulmonary HTN 5. Good cardiac output. Index 4.1   Recent labs: 05/30/2022: Glucose 92, BUN/Cr 25/1.1. EGFR 47. K 4.0 Hb 11.6 TSH 3.2 normal  01/25/2022: Glucose 120, BUN/Cr 24/1.24. EGFR 45. Na/K 140/4.0.  H/H 11/33. MCV 107. Platelets 222   Review of Systems  Cardiovascular:  Negative for chest pain, dyspnea on exertion, leg swelling, palpitations and syncope.         Vitals:   11/03/22 1319  BP: (!) 116/59  Pulse: 77  Resp: 16  SpO2: 96%      Body mass index is 22.02 kg/m. Filed Weights   11/03/22 1319  Weight: 120 lb 6.4 oz (54.6 kg)      Objective:   Physical Exam Vitals and nursing note reviewed.  Constitutional:      General: She is not in acute distress. Neck:     Vascular: No JVD.   Cardiovascular:     Rate and Rhythm: Normal rate and regular rhythm.     Heart sounds: Normal heart sounds. No murmur heard. Pulmonary:     Effort: Pulmonary effort is normal.     Breath sounds: Normal breath sounds. No wheezing or rales.  Musculoskeletal:     Right lower leg: No edema.     Left lower leg: No edema.         Visit diagnoses: No diagnosis found.    No orders of the defined types were placed in this encounter.    Medication changes this visit: There are no discontinued medications.     Assessment & Recommendations:   77 y.o. [place] female with hypertension, nonischemic cardiomyopathy-now with recovered EF, paroxysmal atrial fibrillation, rheumatoid arthritis, chart h/o GI bleed, here to establish care.  *** HFrEF: Occurred in the setting of sepsis in 2021, EF normalized to 55-60% in 2022. She is only on  Entresto 24-26 mg bid. It is unlikely that her EF recovered only with low dose Entresto which makes me think her low EF was related to her sepsis with no recurrence. With her generally low blood pressures, we have mutually decided to discontinue Entresto. I will see her back in 3 months to make sure no recurrence of heart failure  *** PAF: In sinus rhythm. No known recurrence since 01/2022.  CHA2DS2VAS score 3, annual stroke risk 3.6%. Continue Eliquis 5 mg bid.     Thank you for referring the patient to [place]. Please feel free to contact with any questions.   Elder Negus, MD Pager: [phone] Office: [phone]

## 2022-11-05 ENCOUNTER — Encounter: Payer: Self-pay | Admitting: Cardiology

## 2022-11-21 ENCOUNTER — Ambulatory Visit: Payer: Medicare PPO

## 2022-11-21 DIAGNOSIS — I5020 Unspecified systolic (congestive) heart failure: Secondary | ICD-10-CM

## 2022-11-29 ENCOUNTER — Ambulatory Visit: Payer: Medicare PPO | Admitting: Internal Medicine

## 2022-12-04 ENCOUNTER — Encounter: Payer: Self-pay | Admitting: Cardiology

## 2022-12-04 NOTE — Telephone Encounter (Signed)
From patient.

## 2023-04-02 ENCOUNTER — Other Ambulatory Visit: Payer: Self-pay

## 2023-04-02 MED ORDER — APIXABAN 5 MG PO TABS: 5.0000 mg | ORAL_TABLET | Freq: Two times a day (BID) | ORAL | 5 refills | Status: AC

## 2023-04-02 NOTE — Telephone Encounter (Signed)
 Prescription refill request for Eliquis received. Indication:AFIB Last office visit:8/24 Scr:1.25  4/24 Age: 78 Weight:54.6  kg  Prescription refilled

## 2023-04-29 ENCOUNTER — Encounter (HOSPITAL_BASED_OUTPATIENT_CLINIC_OR_DEPARTMENT_OTHER): Payer: Self-pay | Admitting: Emergency Medicine

## 2023-04-29 ENCOUNTER — Other Ambulatory Visit: Payer: Self-pay

## 2023-04-29 ENCOUNTER — Emergency Department (HOSPITAL_BASED_OUTPATIENT_CLINIC_OR_DEPARTMENT_OTHER)
Admission: EM | Admit: 2023-04-29 | Discharge: 2023-04-29 | Disposition: A | Discharge status: Home / Self Care | Payer: Medicare PPO | Attending: Emergency Medicine | Admitting: Emergency Medicine

## 2023-04-29 ENCOUNTER — Emergency Department (HOSPITAL_BASED_OUTPATIENT_CLINIC_OR_DEPARTMENT_OTHER): Payer: Medicare PPO

## 2023-04-29 DIAGNOSIS — Y92512 Supermarket, store or market as the place of occurrence of the external cause: Secondary | ICD-10-CM | POA: Diagnosis not present

## 2023-04-29 DIAGNOSIS — Z23 Encounter for immunization: Secondary | ICD-10-CM | POA: Diagnosis not present

## 2023-04-29 DIAGNOSIS — S6991XA Unspecified injury of right wrist, hand and finger(s), initial encounter: Secondary | ICD-10-CM | POA: Diagnosis present

## 2023-04-29 DIAGNOSIS — M25562 Pain in left knee: Secondary | ICD-10-CM | POA: Insufficient documentation

## 2023-04-29 DIAGNOSIS — M549 Dorsalgia, unspecified: Secondary | ICD-10-CM | POA: Insufficient documentation

## 2023-04-29 DIAGNOSIS — M79641 Pain in right hand: Secondary | ICD-10-CM

## 2023-04-29 DIAGNOSIS — S0990XA Unspecified injury of head, initial encounter: Secondary | ICD-10-CM | POA: Diagnosis not present

## 2023-04-29 DIAGNOSIS — M25561 Pain in right knee: Secondary | ICD-10-CM | POA: Insufficient documentation

## 2023-04-29 DIAGNOSIS — R0781 Pleurodynia: Secondary | ICD-10-CM | POA: Insufficient documentation

## 2023-04-29 DIAGNOSIS — S60011A Contusion of right thumb without damage to nail, initial encounter: Secondary | ICD-10-CM | POA: Diagnosis not present

## 2023-04-29 DIAGNOSIS — Z7901 Long term (current) use of anticoagulants: Secondary | ICD-10-CM | POA: Insufficient documentation

## 2023-04-29 DIAGNOSIS — S50812A Abrasion of left forearm, initial encounter: Secondary | ICD-10-CM | POA: Diagnosis not present

## 2023-04-29 DIAGNOSIS — W19XXXA Unspecified fall, initial encounter: Secondary | ICD-10-CM | POA: Diagnosis not present

## 2023-04-29 HISTORY — DX: Unspecified atrial fibrillation: I48.91

## 2023-04-29 MED ORDER — TETANUS-DIPHTH-ACELL PERTUSSIS 5-2.5-18.5 LF-MCG/0.5 IM SUSY
0.5000 mL | PREFILLED_SYRINGE | Freq: Once | INTRAMUSCULAR | Status: AC
  Administered 2023-04-29: 0.5 mL via INTRAMUSCULAR
  Filled 2023-04-29: qty 0.5

## 2023-04-29 MED ORDER — AMOXICILLIN-POT CLAVULANATE 875-125 MG PO TABS
1.0000 | ORAL_TABLET | Freq: Two times a day (BID) | ORAL | 0 refills | Status: DC
  Filled 2023-04-29: qty 14, 7d supply, fill #0

## 2023-04-29 MED ORDER — POTASSIUM CHLORIDE 20 MEQ PO PACK: 40.0000 meq | PACK | ORAL | Status: DC

## 2023-04-29 MED ORDER — DOXYCYCLINE HYCLATE 100 MG PO CAPS
100.0000 mg | ORAL_CAPSULE | Freq: Two times a day (BID) | ORAL | 0 refills | Status: DC
  Filled 2023-04-29: qty 13, 7d supply, fill #0

## 2023-04-29 NOTE — ED Triage Notes (Addendum)
Pt fell yesterday in [employer], landing on knees and hands; denies hitting head, but thinks neck "jerked backward"; takes Eliquis; c/o pain to bil knees, RT thumb/hand, lower back and chest (when she takes a deep breath)

## 2023-04-29 NOTE — Discharge Instructions (Addendum)
Your CTs were good.  You do have some swelling of your knees, this is likely from inflammation from the fall.  Please take Tylenol for the pain control.  You can also Ace wrap your knees, to help with some of the swelling and pain.  Additionally your x-rays of your hand, was within normal limits, however there is concern there may be a Usama Harkless fracture.  We discussed that putting on a permanent splint, or a larger splint, but you declined, we did decide to put on a Velcro splint instead, and have you follow-up with PCP, return to the ER if you feel like your symptoms are worsening

## 2023-04-29 NOTE — ED Provider Notes (Signed)
[place] [facility] Provider Note   CSN: [id number] Arrival date & time: 04/29/23  1426     History  Chief Complaint  Patient presents with   Samantha Keller is a 78 y.o. female, history of A-fib, who presents to the ED secondary to fall while in [employer] yesterday.  She states she just fell forward, and landed on her knees, and her right hand.  She notes that she is having some right hand pain, posterior left rib cage, and bilateral knees.  She states that she has had some pain, swelling to the right thumb, and states it hurts to move it so she has been using a stretchy splint, with some relief.  Also states that it is hard for her to walk, because of the pain in her knees.  Denies any kind of loss of consciousness, but does endorse having some blood thinners.  Believes she may have hit her head.  Denies any headache, nausea, or vomiting currently  Home Medications Prior to Admission medications   Medication Sig Start Date End Date Taking? Authorizing Provider  apixaban (ELIQUIS) 5 MG TABS tablet Take 1 tablet (5 mg total) by mouth 2 (two) times daily. 04/02/23   Pricilla Riffle, MD  Ascorbic Acid (VITAMIN C) 500 MG CAPS Take 500 mg by mouth daily.    [provider]  Cholecalciferol (VITAMIN D3) 25 MCG (1000 UT) CAPS Take 1 capsule by mouth every morning.    [provider]  cycloSPORINE (RESTASIS) 0.05 % ophthalmic emulsion Place 1 drop into both eyes 2 (two) times daily.     [provider]  DULoxetine (CYMBALTA) 60 MG capsule Take 60 mg by mouth daily.    [provider]  ferrous sulfate 325 (65 FE) MG tablet Take 325 mg by mouth daily with breakfast.    [provider]  folic acid (FOLVITE) 1 MG tablet Take 2 mg by mouth daily.    [provider]  gabapentin (NEURONTIN) 600 MG tablet Take 600 mg by mouth 3 (three) times daily.    [provider]  melatonin 5 MG TABS Take 5 mg by  mouth at bedtime.    [provider]  methotrexate (RHEUMATREX) 2.5 MG tablet Take 10 mg by mouth once a week. Per patient taking 6 tablets once a week totally 15 mg 04/21/20   [provider]  metoprolol tartrate (LOPRESSOR) 25 MG tablet Take 0.5 tablets (12.5 mg total) by mouth as needed. 05/24/22   Gaston Islam., NP  Multiple Vitamins-Minerals (MULTIVITAMIN ADULTS) TABS Take 1 tablet by mouth every morning.    [provider]  pantoprazole (PROTONIX) 40 MG tablet Take 40 mg by mouth daily.     [provider]  pravastatin (PRAVACHOL) 40 MG tablet Take 1 tablet by mouth daily. 01/25/22   [provider]  traZODone (DESYREL) 50 MG tablet Take 50 mg by mouth at bedtime. 12/10/19   [provider]  valACYclovir (VALTREX) 500 MG tablet Take 500 mg by mouth daily. 01/25/22   [provider]      Allergies    Iodinated contrast media, Shellfish allergy, and Shellfish-derived products    Review of Systems   Review of Systems  Physical Exam Updated Vital Signs BP 127/79 (BP Location: Right Arm)   Pulse 99   Temp 97.7 F (36.5 C)   Resp 18   Ht 5\' 1"  (1.549 m)   Wt  54 kg   SpO2 96%   BMI 22.48 kg/m  Physical Exam Vitals and nursing note reviewed.  Constitutional:      General: She is not in acute distress.    Appearance: She is well-developed.  HENT:     Head: Normocephalic and atraumatic.  Eyes:     Conjunctiva/sclera: Conjunctivae normal.  Cardiovascular:     Rate and Rhythm: Normal rate and regular rhythm.     Heart sounds: No murmur heard. Pulmonary:     Effort: Pulmonary effort is normal. No respiratory distress.     Breath sounds: Normal breath sounds.  Abdominal:     Palpations: Abdomen is soft.     Tenderness: There is no abdominal tenderness.  Musculoskeletal:        General: No swelling.     Cervical back: Neck supple.     Comments: Tenderness to palpation of left posterior ribs, without any ecchymosis.   No midline tenderness.  No wounds.  Right hand: TTP of 1st metatarsal and proximal phalanx w/ecchymoses. Radial pulses present. Grip strength intact. Able to flex, extend, ulnar and radial deviate wrist. Two point discrimination intact. Normal thumb opposition. Intact ROM for all MCPs, PIPs, and DIPs.  +snuffbox ttp. No sensory deficits. Capillary refill <2sec  Bilateral knees: Tenderness to palpation of anterior knees, with slight effusions noted.  No medial or lateral joint line tenderness.  Range of motion intact, no laxity.  Positive dorsalis pedis pulse bilateral lower extremities.   Skin:    General: Skin is warm and dry.     Capillary Refill: Capillary refill takes less than 2 seconds.     Comments: Abrasion to left arm  Neurological:     Mental Status: She is alert.  Psychiatric:        Mood and Affect: Mood normal.     ED Results / Procedures / Treatments   Labs (all labs ordered are listed, but only abnormal results are displayed) Labs Reviewed - No data to display  EKG None  Radiology CT Cervical Spine Wo Contrast Result Date: 04/29/2023 CLINICAL DATA:  Neck trauma EXAM: CT CERVICAL SPINE WITHOUT CONTRAST TECHNIQUE: Multidetector CT imaging of the cervical spine was performed without intravenous contrast. Multiplanar CT image reconstructions were also generated. RADIATION DOSE REDUCTION: This exam was performed according to the departmental dose-optimization program which includes automated exposure control, adjustment of the mA and/or kV according to patient size and/or use of iterative reconstruction technique. COMPARISON:  CT 04/15/2021 FINDINGS: Alignment: Straightening of the cervical spine. Trace retrolisthesis C3 on C4 and trace anterolisthesis C2 on C3 and C7 on T1. facet alignment is within normal limits. Skull base and vertebrae: No acute fracture. No primary bone lesion or focal pathologic process. Soft tissues and spinal canal: No prevertebral fluid or swelling. No  visible canal hematoma. Disc levels: Advanced C1-C2 degenerative change. Status post anterior fusion C4 through C7 with interbody devices. Advanced disc space narrowing and degenerative change C3-C4 with moderate degenerative change at C7-T1. Multilevel advanced facet degenerative change with foraminal narrowing. Fusion of the T1 and T2 vertebral bodies. Upper chest: Negative. Other: None IMPRESSION: 1. Straightening of the cervical spine with trace retrolisthesis C3 on C4 and trace anterolisthesis C2 on C3 and C7 on T1. No acute osseous abnormality. 2. Status post anterior fusion C4 through C7. Advanced degenerative changes at C1-C2 and C3-C4. Electronically Signed   By: Jasmine Pang M.D.   On: 04/29/2023 16:06   DG Knee Complete 4 Views Left Result Date:  04/29/2023 CLINICAL DATA:  Fall with knee pain EXAM: LEFT KNEE - COMPLETE 4+ VIEW COMPARISON:  04/28/2022 FINDINGS: No fracture or malalignment. Probable Rakan Soffer knee effusion. Mild patellofemoral degenerative changes. IMPRESSION: No acute osseous abnormality. Probable Zenaya Ulatowski knee effusion. Mild patellofemoral degenerative changes. Electronically Signed   By: Jasmine Pang M.D.   On: 04/29/2023 15:45   DG Knee Complete 4 Views Right Result Date: 04/29/2023 CLINICAL DATA:  Fall EXAM: RIGHT KNEE - COMPLETE 4+ VIEW COMPARISON:  04/28/2022 FINDINGS: No malalignment. No significant effusion. Medial knee swelling. Mild lateral and moderate patellofemoral degenerative changes. Irregularity at the lateral margin of the fibular head is unchanged. IMPRESSION: 1. Medial knee swelling.  No definite acute displaced fracture. 2. Degenerative changes. Electronically Signed   By: Jasmine Pang M.D.   On: 04/29/2023 15:44   DG Hand Complete Right Result Date: 04/29/2023 CLINICAL DATA:  Fall EXAM: RIGHT HAND - COMPLETE 3+ VIEW COMPARISON:  None Available. FINDINGS: No malalignment. No definitive acute fracture. Moderate arthritis at the first [facility] joint with moderate advanced  arthritis at the first IP and MCP joints as well as the second D IP, MCP and third MCP joints. Diffuse joint space narrowing of the PIP joints. Heterogenous mineralization shafts of the second through fifth metacarpals and the proximal phalanges possibly due to osteopenia. Widened scapholunate interval. Moderate arthritis at the radiocarpal joint. IMPRESSION: 1. No definitive acute osseous abnormality. 2. Multifocal arthritis as described above. 3. Widened scapholunate interval suggesting ligamentous disease Electronically Signed   By: Jasmine Pang M.D.   On: 04/29/2023 15:38   DG Lumbar Spine Complete Result Date: 04/29/2023 CLINICAL DATA:  Fall EXAM: LUMBAR SPINE - COMPLETE 4+ VIEW COMPARISON:  07/13/2019 FINDINGS: Mild scoliosis. Stable lumbar alignment with grade 1 anterolisthesis L4 on L5 and L5 on S1. Posterior spinal hardware and interbody device at L4-L5 grossly stable. Vertebral body heights are maintained. Moderate severe disc space narrowing and degenerative change L2-L3, L3-L4 and L5-S1. Minimal chronic superior endplate deformity at T12. IMPRESSION: 1. No acute osseous abnormality. 2. Stable postsurgical changes at L4-L5. 3. Multilevel degenerative changes. Electronically Signed   By: Jasmine Pang M.D.   On: 04/29/2023 15:34   CT Head Wo Contrast Result Date: 04/29/2023 CLINICAL DATA:  Head trauma EXAM: CT HEAD WITHOUT CONTRAST TECHNIQUE: Contiguous axial images were obtained from the base of the skull through the vertex without intravenous contrast. RADIATION DOSE REDUCTION: This exam was performed according to the departmental dose-optimization program which includes automated exposure control, adjustment of the mA and/or kV according to patient size and/or use of iterative reconstruction technique. COMPARISON:  12/11/2022 FINDINGS: Brain: There is atrophy and chronic Yani Lal vessel disease changes. Old left cerebellar infarct, stable. No acute intracranial abnormality. Specifically, no  hemorrhage, hydrocephalus, mass lesion, acute infarction, or significant intracranial injury. Vascular: No hyperdense vessel or unexpected calcification. Skull: No acute calvarial abnormality. Sinuses/Orbits: No acute findings Other: None IMPRESSION: Atrophy, chronic microvascular disease. No acute intracranial abnormality. Old left cerebellar infarct. Electronically Signed   By: Charlett Nose M.D.   On: 04/29/2023 15:33   DG Chest 2 View Result Date: 04/29/2023 CLINICAL DATA:  Fall with chest pain EXAM: CHEST - 2 VIEW COMPARISON:  06/25/2019 FINDINGS: Calcified left breast implant. Normal cardiomediastinal silhouette. No definitive pneumothorax. Scoliosis of the spine. Partially imaged hardware in the cervical and lower lumbar spine. Suspect old right 6 seventh and eighth lateral to anterolateral rib fractures. IMPRESSION: No active cardiopulmonary disease. Suspect old right rib fractures. Electronically Signed   By:  Jasmine Pang M.D.   On: 04/29/2023 15:31    Procedures Procedures    Medications Ordered in ED Medications  Tdap (BOOSTRIX) injection 0.5 mL (0.5 mLs Intramuscular Given 04/29/23 1715)    ED Course/ Medical Decision Making/ A&P                                 Medical Decision Making Patient is 78 year old female, here for bilateral knee pain, right hand pain, as well as left posterior back pain, and possible head injury, on Eliquis, here for a fall.  Triage ordered CTs, as well as x-rays.  She did a head CT, neck CT, x-ray of the bilateral knees, and hand.  Will update tetanus, given wound.  Chest x-ray as well for the rib cage pain.  She has no evidence of ecchymosis, thus we will hold off on abdominal or chest CT at this time.  No midline tenderness thus no C-spine, L-spine, or T-spine ordered  Amount and/or Complexity of Data Reviewed Radiology:     Details: X-rays are fairly unremarkable except for some old right rib cage fractures, widening of scapholunate interval, to  suggesting ligamentous disease, as well as some mild knee swelling. Discussion of management or test interpretation with external provider(s): Discussed with patient, her x-rays are overall reassuring she does have slight snuffbox tenderness however the right hand.  We discussed placing a splint, she declined a fiberglass splint, but is okay with a Velcro splint splint.  She was advised on risk of scaphoid fractures, and insufficiency related to improper splinting, and she voiced understanding and was agreement with local splint.  She will follow-up with orthopedics.  We Ace wrapped her knees, and instructed her to take Tylenol for her symptoms.  She has no evidence of ecchymosis, has nonlabored respirations, and was discharged home with strict return precautions.  Head CT and neck were unremarkable  Risk Prescription drug management.    Final Clinical Impression(s) / ED Diagnoses Final diagnoses:  Pain of right hand  Acute pain of both knees  Injury of head, initial encounter  Fall, initial encounter  Rib pain    Rx / DC Orders ED Discharge Orders          Ordered    amoxicillin-clavulanate (AUGMENTIN) 875-125 MG tablet  Every 12 hours,   Status:  Discontinued        04/29/23 1650    doxycycline (VIBRAMYCIN) 100 MG capsule  2 times daily,   Status:  Discontinued        04/29/23 1650              Clovia Reine, [place], PA 04/29/23 1718    Vanetta Mulders, MD 04/29/23 2355

## 2023-04-30 ENCOUNTER — Other Ambulatory Visit (HOSPITAL_BASED_OUTPATIENT_CLINIC_OR_DEPARTMENT_OTHER): Payer: Self-pay

## 2023-05-02 ENCOUNTER — Telehealth (HOSPITAL_COMMUNITY): Payer: Self-pay | Admitting: Cardiology

## 2023-05-02 NOTE — Telephone Encounter (Signed)
 Patient called and cancelled echocardiogram scheduled for 05/09/23. Patient does not wish to reschedule. Order will be removed from the active echo WQ. Thank you.

## 2023-05-09 ENCOUNTER — Ambulatory Visit (HOSPITAL_COMMUNITY): Payer: Medicare PPO

## 2023-05-09 ENCOUNTER — Other Ambulatory Visit: Payer: Medicare PPO

## 2023-06-13 ENCOUNTER — Ambulatory Visit: Payer: Medicare PPO | Admitting: Cardiology

## 2023-08-13 ENCOUNTER — Other Ambulatory Visit: Payer: Self-pay | Admitting: Internal Medicine

## 2023-10-22 ENCOUNTER — Other Ambulatory Visit: Payer: Self-pay | Admitting: Nurse Practitioner

## 2024-04-16 ENCOUNTER — Other Ambulatory Visit: Payer: Self-pay | Admitting: Internal Medicine

## 2024-04-17 NOTE — Telephone Encounter (Signed)
 Per chart pt is seeing Dr. Lupe Champ from [facility] ( cardiology).
